# Patient Record
Sex: Female | Born: 1960 | Race: White | Hispanic: No | Smoking: Former smoker
Health system: Southern US, Community
[De-identification: ages and names within clinical notes are randomized; demographics above are authoritative.]

## PROBLEM LIST (undated history)

## (undated) DIAGNOSIS — E0500 Thyrotoxicosis with diffuse goiter without thyrotoxic crisis or storm: Secondary | ICD-10-CM

## (undated) DIAGNOSIS — T7840XA Allergy, unspecified, initial encounter: Secondary | ICD-10-CM

## (undated) DIAGNOSIS — F329 Major depressive disorder, single episode, unspecified: Secondary | ICD-10-CM

## (undated) DIAGNOSIS — I10 Essential (primary) hypertension: Secondary | ICD-10-CM

## (undated) DIAGNOSIS — F32A Depression, unspecified: Secondary | ICD-10-CM

## (undated) DIAGNOSIS — J449 Chronic obstructive pulmonary disease, unspecified: Secondary | ICD-10-CM

## (undated) DIAGNOSIS — Z9001 Acquired absence of eye: Secondary | ICD-10-CM

## (undated) DIAGNOSIS — F419 Anxiety disorder, unspecified: Secondary | ICD-10-CM

## (undated) DIAGNOSIS — E785 Hyperlipidemia, unspecified: Secondary | ICD-10-CM

## (undated) DIAGNOSIS — K529 Noninfective gastroenteritis and colitis, unspecified: Secondary | ICD-10-CM

## (undated) HISTORY — DX: Allergy, unspecified, initial encounter: T78.40XA

## (undated) HISTORY — DX: Noninfective gastroenteritis and colitis, unspecified: K52.9

## (undated) HISTORY — DX: Major depressive disorder, single episode, unspecified: F32.9

## (undated) HISTORY — DX: Hyperlipidemia, unspecified: E78.5

## (undated) HISTORY — DX: Thyrotoxicosis with diffuse goiter without thyrotoxic crisis or storm: E05.00

## (undated) HISTORY — PX: ABDOMINAL HYSTERECTOMY: SHX81

## (undated) HISTORY — PX: FRACTURE SURGERY: SHX138

## (undated) HISTORY — DX: Essential (primary) hypertension: I10

## (undated) HISTORY — DX: Anxiety disorder, unspecified: F41.9

## (undated) HISTORY — DX: Depression, unspecified: F32.A

## (undated) HISTORY — DX: Acquired absence of eye: Z90.01

---

## 2005-01-24 DIAGNOSIS — Z9001 Acquired absence of eye: Secondary | ICD-10-CM

## 2005-01-24 HISTORY — DX: Acquired absence of eye: Z90.01

## 2005-09-24 ENCOUNTER — Emergency Department: Payer: Self-pay | Admitting: Emergency Medicine

## 2015-06-23 DIAGNOSIS — J439 Emphysema, unspecified: Secondary | ICD-10-CM | POA: Insufficient documentation

## 2017-12-06 ENCOUNTER — Emergency Department: Payer: Medicaid Other

## 2017-12-06 ENCOUNTER — Emergency Department
Admission: EM | Admit: 2017-12-06 | Discharge: 2017-12-06 | Disposition: A | Discharge status: Home / Self Care | Payer: Medicaid Other | Attending: Emergency Medicine | Admitting: Emergency Medicine

## 2017-12-06 ENCOUNTER — Other Ambulatory Visit: Payer: Self-pay

## 2017-12-06 ENCOUNTER — Encounter: Payer: Self-pay | Admitting: Emergency Medicine

## 2017-12-06 DIAGNOSIS — J441 Chronic obstructive pulmonary disease with (acute) exacerbation: Secondary | ICD-10-CM | POA: Insufficient documentation

## 2017-12-06 DIAGNOSIS — F17210 Nicotine dependence, cigarettes, uncomplicated: Secondary | ICD-10-CM | POA: Insufficient documentation

## 2017-12-06 DIAGNOSIS — Z7982 Long term (current) use of aspirin: Secondary | ICD-10-CM | POA: Insufficient documentation

## 2017-12-06 DIAGNOSIS — Z79899 Other long term (current) drug therapy: Secondary | ICD-10-CM | POA: Insufficient documentation

## 2017-12-06 DIAGNOSIS — R0602 Shortness of breath: Secondary | ICD-10-CM | POA: Diagnosis present

## 2017-12-06 HISTORY — DX: Chronic obstructive pulmonary disease, unspecified: J44.9

## 2017-12-06 LAB — BASIC METABOLIC PANEL
ANION GAP: 9 (ref 5–15)
BUN: 14 mg/dL (ref 6–20)
CHLORIDE: 101 mmol/L (ref 98–111)
CO2: 30 mmol/L (ref 22–32)
Calcium: 9.8 mg/dL (ref 8.9–10.3)
Creatinine, Ser: 0.71 mg/dL (ref 0.44–1.00)
GFR calc non Af Amer: >60 mL/min (ref >60)
GLUCOSE: 104 mg/dL — AB (ref 70–99)
POTASSIUM: 4.3 mmol/L (ref 3.5–5.1)
Sodium: 140 mmol/L (ref 135–145)

## 2017-12-06 LAB — CBC
HEMATOCRIT: 50.6 % — AB (ref 36.0–46.0)
HEMOGLOBIN: 16.4 g/dL — AB (ref 12.0–15.0)
MCH: 30 pg (ref 26.0–34.0)
MCHC: 32.4 g/dL (ref 30.0–36.0)
MCV: 92.7 fL (ref 80.0–100.0)
NRBC: 0 % (ref 0.0–0.2)
PLATELETS: 303 10*3/uL (ref 150–400)
RBC: 5.46 MIL/uL — AB (ref 3.87–5.11)
RDW: 13.5 % (ref 11.5–15.5)
WBC: 8.2 10*3/uL (ref 4.0–10.5)

## 2017-12-06 LAB — TROPONIN I: Troponin I: <0.03 ng/mL (ref <0.03)

## 2017-12-06 LAB — INFLUENZA PANEL BY PCR (TYPE A & B)
INFLAPCR: NEGATIVE
INFLBPCR: NEGATIVE

## 2017-12-06 LAB — MAGNESIUM: Magnesium: 2 mg/dL (ref 1.7–2.4)

## 2017-12-06 MED ORDER — IPRATROPIUM-ALBUTEROL 0.5-2.5 (3) MG/3ML IN SOLN
3.0000 mL | Freq: Once | RESPIRATORY_TRACT | Status: AC
  Administered 2017-12-06: 3 mL via RESPIRATORY_TRACT
  Filled 2017-12-06: qty 6

## 2017-12-06 MED ORDER — CYCLOBENZAPRINE HCL 10 MG PO TABS: 10.0000 mg | ORAL_TABLET | Freq: Three times a day (TID) | ORAL | 0 refills | Status: AC | PRN

## 2017-12-06 MED ORDER — PREDNISONE 20 MG PO TABS: 60.0000 mg | ORAL_TABLET | Freq: Every day | ORAL | 0 refills | Status: AC

## 2017-12-06 MED ORDER — ALBUTEROL SULFATE (2.5 MG/3ML) 0.083% IN NEBU
2.5000 mg | INHALATION_SOLUTION | Freq: Once | RESPIRATORY_TRACT | Status: AC
  Administered 2017-12-06: 2.5 mg via RESPIRATORY_TRACT
  Filled 2017-12-06: qty 3

## 2017-12-06 MED ORDER — CYCLOBENZAPRINE HCL 10 MG PO TABS
10.0000 mg | ORAL_TABLET | Freq: Once | ORAL | Status: AC
  Administered 2017-12-06: 10 mg via ORAL
  Filled 2017-12-06: qty 1

## 2017-12-06 MED ORDER — IPRATROPIUM-ALBUTEROL 0.5-2.5 (3) MG/3ML IN SOLN
3.0000 mL | Freq: Once | RESPIRATORY_TRACT | Status: AC
  Administered 2017-12-06: 3 mL via RESPIRATORY_TRACT
  Filled 2017-12-06: qty 3

## 2017-12-06 MED ORDER — ALBUTEROL SULFATE HFA 108 (90 BASE) MCG/ACT IN AERS: 2.0000 | INHALATION_SPRAY | Freq: Four times a day (QID) | RESPIRATORY_TRACT | 1 refills | Status: DC | PRN

## 2017-12-06 MED ORDER — METHYLPREDNISOLONE SODIUM SUCC 125 MG IJ SOLR
125.0000 mg | Freq: Once | INTRAMUSCULAR | Status: AC
  Administered 2017-12-06: 125 mg via INTRAVENOUS
  Filled 2017-12-06: qty 2

## 2017-12-06 NOTE — ED Provider Notes (Signed)
[facility] Emergency Department Provider Note ____________________________________________   First MD Initiated Contact with Patient 12/06/17 1426     (approximate)  I have reviewed the triage vital signs and the nursing notes.   HISTORY  Chief Complaint Shortness of Breath    HPI Alicia Gibson is a 57 y.o. female with PMH of COPD on home O2 who presents with shortness of breath, gradual onset, persistent course over the last week, and associated with nonproductive cough.  The patient also feels like she has muscle pain in her lungs due to the cough.  She also reports some itching and "hives" to her upper back.  No fevers, vomiting, chest pain, or leg swelling.   Past Medical History:  Diagnosis Date  . COPD (chronic obstructive pulmonary disease) (HCC)     There are no active problems to display for this patient.   Past Surgical History:  Procedure Laterality Date  . ABDOMINAL HYSTERECTOMY    . FRACTURE SURGERY      Prior to Admission medications   Medication Sig Start Date End Date Taking? Authorizing Provider  albuterol (PROVENTIL HFA;VENTOLIN HFA) 108 (90 Base) MCG/ACT inhaler Inhale 2 puffs into the lungs every 4 (four) hours as needed for wheezing.   Yes [provider]  albuterol (PROVENTIL) (2.5 MG/3ML) 0.083% nebulizer solution Take 2.5 mg by nebulization 4 (four) times daily.   Yes [provider]  amLODipine (NORVASC) 5 MG tablet Take 5 mg by mouth daily.   Yes [provider]  aspirin 81 MG chewable tablet Chew 81 mg by mouth daily.   Yes [provider]  cyclobenzaprine (FLEXERIL) 10 MG tablet Take 10 mg by mouth 2 (two) times daily as needed for muscle spasms.   Yes [provider]  fluticasone furoate-vilanterol (BREO ELLIPTA) 200-25 MCG/INH AEPB Inhale 1 puff into the lungs daily.   Yes [provider]  guaiFENesin (MUCINEX) 600 MG 12 hr tablet Take 1,200 mg by mouth 2 (two)  times daily as needed for cough or to loosen phlegm.   Yes [provider]  ibuprofen (ADVIL,MOTRIN) 200 MG tablet Take 200 mg by mouth every 6 (six) hours as needed for headache or mild pain.   Yes [provider]  mometasone-formoterol (DULERA) 200-5 MCG/ACT AERO Inhale 2 puffs into the lungs 2 (two) times daily.   Yes [provider]  Multiple Vitamin (MULTIVITAMIN WITH MINERALS) TABS tablet Take 1 tablet by mouth daily.   Yes [provider]  Tiotropium Bromide Monohydrate 2.5 MCG/ACT AERS Inhale 2 puffs into the lungs at bedtime.   Yes [provider]  albuterol (PROVENTIL HFA;VENTOLIN HFA) 108 (90 Base) MCG/ACT inhaler Inhale 2 puffs into the lungs every 6 (six) hours as needed for wheezing or shortness of breath. 12/06/17   [name], MD  cyclobenzaprine (FLEXERIL) 10 MG tablet Take 1 tablet (10 mg total) by mouth 3 (three) times daily as needed for up to 5 days. 12/06/17 12/11/17  [name], MD  predniSONE (DELTASONE) 20 MG tablet Take 3 tablets (60 mg total) by mouth daily for 5 days. 12/07/17 12/12/17  [name], MD    Allergies Codeine; Shellfish allergy; and Penicillins  No family history on file.  Social History Social History   Tobacco Use  . Smoking status: Current Some Day Smoker    Types: Cigarettes  . Smokeless tobacco: Never Used  Substance Use Topics  . Alcohol use: Never    Frequency: Never  . Drug use: Never  Review of Systems  Constitutional: No fever. Eyes: No redness. ENT: No sore throat. Cardiovascular: Denies chest pain. Respiratory: Positive for shortness of breath. Gastrointestinal: No vomiting.  Genitourinary: Negative for flank pain.  Musculoskeletal: Positive for back pain. Skin: Positive for itchy rash to the upper back. Neurological: Negative for headache.   ____________________________________________   PHYSICAL EXAM:  VITAL SIGNS: ED Triage Vitals  Enc  Vitals Group     BP 12/06/17 1414 (!) 142/104     Pulse Rate 12/06/17 1414 (!) 107     Resp 12/06/17 1414 (!) 24     Temp 12/06/17 1414 97.7 F (36.5 C)     Temp Source 12/06/17 1414 Oral     SpO2 12/06/17 1414 96 %     Weight 12/06/17 1415 110 lb (49.9 kg)     Height 12/06/17 1415 5\' 6"  (1.676 m)     Head Circumference --      Peak Flow --      Pain Score 12/06/17 1414 8     Pain Loc --      Pain Edu? --      Excl. in GC? --     Constitutional: Alert and oriented.  Uncomfortable appearing with increased work of breathing but no acute distress.  Speaking full sentences. Eyes: Conjunctivae are normal.  Head: Atraumatic. Nose: No congestion/rhinnorhea. Mouth/Throat: Mucous membranes are slightly dry.   Neck: Normal range of motion.  Cardiovascular: Tachycardic, regular rhythm. Grossly normal heart sounds.  Good peripheral circulation. Respiratory: Increased respiratory effort.  Decreased breath sounds bilaterally with faint wheezing. Gastrointestinal: Soft and nontender. No distention.  Genitourinary: No flank tenderness. Musculoskeletal: No lower extremity edema.  Extremities warm and well perfused.  Neurologic:  Normal speech and language. No gross focal neurologic deficits are appreciated.  Skin:  Skin is warm and dry. No rash noted. Psychiatric: Mood and affect are normal. Speech and behavior are normal.  ____________________________________________   LABS (all labs ordered are listed, but only abnormal results are displayed)  Labs Reviewed  BASIC METABOLIC PANEL - Abnormal; Notable for the following components:      Result Value   Glucose, Bld 104 (*)    All other components within normal limits  CBC - Abnormal; Notable for the following components:   RBC 5.46 (*)    Hemoglobin 16.4 (*)    HCT 50.6 (*)    All other components within normal limits  TROPONIN I  MAGNESIUM  INFLUENZA PANEL BY PCR (TYPE A & B)    ____________________________________________  EKG  ED ECG REPORT I, [name], the attending physician, personally viewed and interpreted this ECG.  Date: 12/06/2017 EKG Time: 1417 Rate: 103 Rhythm: Sinus tachycardia QRS Axis: Right axis Intervals: normal ST/T Wave abnormalities: normal Narrative Interpretation: Pulmonary disease pattern with no evidence of acute ischemia  ____________________________________________  RADIOLOGY  CXR no focal infiltrate  ____________________________________________   PROCEDURES  Procedure(s) performed: No  Procedures  Critical Care performed: No ____________________________________________   INITIAL IMPRESSION / ASSESSMENT AND PLAN / ED COURSE  Pertinent labs & imaging results that were available during my care of the patient were reviewed by me and considered in my medical decision making (see chart for details).  57 year old female with history of COPD on 2 L home O2 presents with worsening shortness of breath and cough over the last week, acutely worsened in the last few days.  The patient states that today she came in because her boss at work made her be seen.  On exam the patient is slightly tachycardic but her other vital signs are normal.  Her O2 saturation is in the mid to high 90s on her normal 2 L.  She does have increased work of breathing, decreased breath sounds bilaterally, and wheezing.  Overall presentation is consistent with COPD.  Differential also includes pneumonia or acute bronchitis.  I have a low suspicion for cardiac etiology, and given the decreased breath sounds and wheezing as well as the known history of COPD I do not suspect PE.  We will obtain chest x-ray, give bronchodilators and steroid, lab work-up, influenza, and reassess.  ----------------------------------------- 3:56 PM on 12/06/2017 -----------------------------------------  The patient is feeling somewhat better after the initial  nebulizer treatment.  Her chest x-ray shows no focal infiltrate.  Her lab work-up is reassuring.  Since her O2 saturation is good and she is improving with the treatment, I think she likely would be able to go home after another 1 to 2 hours of observation.  She is pending the flu swab.  I am signing the patient out to the oncoming physician Dr. Derrill KayGoodman. ____________________________________________   FINAL CLINICAL IMPRESSION(S) / ED DIAGNOSES  Final diagnoses:  COPD exacerbation (HCC)      NEW MEDICATIONS STARTED DURING THIS VISIT:  New Prescriptions   ALBUTEROL (PROVENTIL HFA;VENTOLIN HFA) 108 (90 BASE) MCG/ACT INHALER    Inhale 2 puffs into the lungs every 6 (six) hours as needed for wheezing or shortness of breath.   CYCLOBENZAPRINE (FLEXERIL) 10 MG TABLET    Take 1 tablet (10 mg total) by mouth 3 (three) times daily as needed for up to 5 days.   PREDNISONE (DELTASONE) 20 MG TABLET    Take 3 tablets (60 mg total) by mouth daily for 5 days.     Note:  This document was prepared using Dragon voice recognition software and may include unintentional dictation errors.    Dionne BucySiadecki, Cederic Mozley, MD 12/06/17 [phone]

## 2017-12-06 NOTE — ED Triage Notes (Signed)
Pt in via POV with worsening shortness of breath over the last few days.  Pt also reports hives, itching to back and chest x one week.  Pt afebrile upon arrival, on 2L nasal cannula at baseline, acute dyspnea at rest.  Pt roomed upon completion of triage.

## 2017-12-06 NOTE — ED Provider Notes (Signed)
Patient's flu negative. On my exam still has small amount of wheezing. Discussed with patient. She states she feels much improved. Feels comfortable going home at this time and that she is close to her baseline in terms of breathing. Discussed return precautions.   Phineas SemenGoodman, Zadrian Mccauley, MD 12/06/17 [phone]

## 2017-12-06 NOTE — ED Notes (Signed)
Pt states she has hives on upper back that are very itchy. Multiple scabbed over bumps noted on upper back.

## 2017-12-06 NOTE — Discharge Instructions (Signed)
Take the prednisone as prescribed for the next 5 days starting tomorrow.  Return to the ER for new, worsening, persistent severe shortness of breath, wheezing, chest pain, or any other new or worsening symptoms that concern you.

## 2017-12-20 ENCOUNTER — Other Ambulatory Visit: Payer: Self-pay | Admitting: Ophthalmology

## 2017-12-20 DIAGNOSIS — J449 Chronic obstructive pulmonary disease, unspecified: Secondary | ICD-10-CM

## 2018-01-09 ENCOUNTER — Other Ambulatory Visit: Payer: Self-pay | Admitting: Ophthalmology

## 2018-01-09 ENCOUNTER — Ambulatory Visit: Admission: RE | Admit: 2018-01-09 | Payer: Disability Insurance | Source: Ambulatory Visit | Admitting: *Deleted

## 2018-01-09 ENCOUNTER — Ambulatory Visit: Payer: Disability Insurance | Attending: Ophthalmology

## 2018-01-09 DIAGNOSIS — J449 Chronic obstructive pulmonary disease, unspecified: Secondary | ICD-10-CM

## 2018-01-09 DIAGNOSIS — M549 Dorsalgia, unspecified: Secondary | ICD-10-CM

## 2018-01-09 MED ORDER — ALBUTEROL SULFATE (2.5 MG/3ML) 0.083% IN NEBU
2.5000 mg | INHALATION_SOLUTION | Freq: Once | RESPIRATORY_TRACT | Status: AC
  Administered 2018-01-09: 2.5 mg via RESPIRATORY_TRACT
  Filled 2018-01-09: qty 3

## 2018-02-01 ENCOUNTER — Other Ambulatory Visit: Payer: Self-pay

## 2018-02-01 ENCOUNTER — Ambulatory Visit: Payer: Self-pay | Admitting: Gerontology

## 2018-02-01 ENCOUNTER — Encounter: Payer: Self-pay | Admitting: Gerontology

## 2018-02-01 VITALS — BP 141/86 | HR 91 | Temp 97.5°F | Ht 65.0 in | Wt 112.5 lb

## 2018-02-01 DIAGNOSIS — Z720 Tobacco use: Secondary | ICD-10-CM

## 2018-02-01 DIAGNOSIS — Z0000 Encounter for general adult medical examination without abnormal findings: Secondary | ICD-10-CM

## 2018-02-01 DIAGNOSIS — Z7689 Persons encountering health services in other specified circumstances: Secondary | ICD-10-CM

## 2018-02-01 DIAGNOSIS — M62838 Other muscle spasm: Secondary | ICD-10-CM

## 2018-02-01 DIAGNOSIS — I10 Essential (primary) hypertension: Secondary | ICD-10-CM

## 2018-02-01 DIAGNOSIS — J449 Chronic obstructive pulmonary disease, unspecified: Secondary | ICD-10-CM

## 2018-02-01 MED ORDER — ASPIRIN 81 MG PO CHEW: 81.0000 mg | CHEWABLE_TABLET | Freq: Every day | ORAL | 3 refills | Status: DC

## 2018-02-01 MED ORDER — ALBUTEROL SULFATE (2.5 MG/3ML) 0.083% IN NEBU: 2.5000 mg | INHALATION_SOLUTION | Freq: Four times a day (QID) | RESPIRATORY_TRACT | 3 refills | Status: DC

## 2018-02-01 MED ORDER — MOMETASONE FURO-FORMOTEROL FUM 200-5 MCG/ACT IN AERO: 2.0000 | INHALATION_SPRAY | Freq: Two times a day (BID) | RESPIRATORY_TRACT | 3 refills | Status: DC

## 2018-02-01 MED ORDER — AMLODIPINE BESYLATE 5 MG PO TABS: 5.0000 mg | ORAL_TABLET | Freq: Every day | ORAL | 1 refills | Status: DC

## 2018-02-01 MED ORDER — CYCLOBENZAPRINE HCL 10 MG PO TABS: 10.0000 mg | ORAL_TABLET | Freq: Two times a day (BID) | ORAL | 3 refills | Status: DC | PRN

## 2018-02-01 MED ORDER — TIOTROPIUM BROMIDE MONOHYDRATE 2.5 MCG/ACT IN AERS: 2.0000 | INHALATION_SPRAY | Freq: Every day | RESPIRATORY_TRACT | 3 refills | Status: DC

## 2018-02-01 MED ORDER — FLUTICASONE FUROATE-VILANTEROL 200-25 MCG/INH IN AEPB: 1.0000 | INHALATION_SPRAY | Freq: Every day | RESPIRATORY_TRACT | 3 refills | Status: DC

## 2018-02-01 MED ORDER — ALBUTEROL SULFATE HFA 108 (90 BASE) MCG/ACT IN AERS: 2.0000 | INHALATION_SPRAY | Freq: Four times a day (QID) | RESPIRATORY_TRACT | 1 refills | Status: DC | PRN

## 2018-02-01 NOTE — Patient Instructions (Signed)
DASH Eating Plan DASH stands for "Dietary Approaches to Stop Hypertension." The DASH eating plan is a healthy eating plan that has been shown to reduce high blood pressure (hypertension). It may also reduce your risk for type 2 diabetes, heart disease, and stroke. The DASH eating plan may also help with weight loss. What are tips for following this plan?  General guidelines  Avoid eating more than 2,300 mg (milligrams) of salt (sodium) a day. If you have hypertension, you may need to reduce your sodium intake to 1,500 mg a day.  Limit alcohol intake to no more than 1 drink a day for nonpregnant women and 2 drinks a day for men. One drink equals 12 oz of beer, 5 oz of wine, or 1 oz of hard liquor.  Work with your health care provider to maintain a healthy body weight or to lose weight. Ask what an ideal weight is for you.  Get at least 30 minutes of exercise that causes your heart to beat faster (aerobic exercise) most days of the week. Activities may include walking, swimming, or biking.  Work with your health care provider or diet and nutrition specialist (dietitian) to adjust your eating plan to your individual calorie needs. Reading food labels   Check food labels for the amount of sodium per serving. Choose foods with less than 5 percent of the Daily Value of sodium. Generally, foods with less than 300 mg of sodium per serving fit into this eating plan.  To find whole grains, look for the word "whole" as the first word in the ingredient list. Shopping  Buy products labeled as "low-sodium" or "no salt added."  Buy fresh foods. Avoid canned foods and premade or frozen meals. Cooking  Avoid adding salt when cooking. Use salt-free seasonings or herbs instead of table salt or sea salt. Check with your health care provider or pharmacist before using salt substitutes.  Do not fry foods. Cook foods using healthy methods such as baking, boiling, grilling, and broiling instead.  Cook with  heart-healthy oils, such as olive, canola, soybean, or sunflower oil. Meal planning  Eat a balanced diet that includes: ? 5 or more servings of fruits and vegetables each day. At each meal, try to fill half of your plate with fruits and vegetables. ? Up to 6-8 servings of whole grains each day. ? Less than 6 oz of lean meat, poultry, or fish each day. A 3-oz serving of meat is about the same size as a deck of cards. One egg equals 1 oz. ? 2 servings of low-fat dairy each day. ? A serving of nuts, seeds, or beans 5 times each week. ? Heart-healthy fats. Healthy fats called Omega-3 fatty acids are found in foods such as flaxseeds and coldwater fish, like sardines, salmon, and mackerel.  Limit how much you eat of the following: ? Canned or prepackaged foods. ? Food that is high in trans fat, such as fried foods. ? Food that is high in saturated fat, such as fatty meat. ? Sweets, desserts, sugary drinks, and other foods with added sugar. ? Full-fat dairy products.  Do not salt foods before eating.  Try to eat at least 2 vegetarian meals each week.  Eat more home-cooked food and less restaurant, buffet, and fast food.  When eating at a restaurant, ask that your food be prepared with less salt or no salt, if possible. What foods are recommended? The items listed may not be a complete list. Talk with your dietitian about   what dietary choices are best for you. Grains Whole-grain or whole-wheat bread. Whole-grain or whole-wheat pasta. Brown rice. Oatmeal. Quinoa. Bulgur. Whole-grain and low-sodium cereals. Pita bread. Low-fat, low-sodium crackers. Whole-wheat flour tortillas. Vegetables Fresh or frozen vegetables (raw, steamed, roasted, or grilled). Low-sodium or reduced-sodium tomato and vegetable juice. Low-sodium or reduced-sodium tomato sauce and tomato paste. Low-sodium or reduced-sodium canned vegetables. Fruits All fresh, dried, or frozen fruit. Canned fruit in natural juice (without  added sugar). Meat and other protein foods Skinless chicken or turkey. Ground chicken or turkey. Pork with fat trimmed off. Fish and seafood. Egg whites. Dried beans, peas, or lentils. Unsalted nuts, nut butters, and seeds. Unsalted canned beans. Lean cuts of beef with fat trimmed off. Low-sodium, lean deli meat. Dairy Low-fat (1%) or fat-free (skim) milk. Fat-free, low-fat, or reduced-fat cheeses. Nonfat, low-sodium ricotta or cottage cheese. Low-fat or nonfat yogurt. Low-fat, low-sodium cheese. Fats and oils Soft margarine without trans fats. Vegetable oil. Low-fat, reduced-fat, or light mayonnaise and salad dressings (reduced-sodium). Canola, safflower, olive, soybean, and sunflower oils. Avocado. Seasoning and other foods Herbs. Spices. Seasoning mixes without salt. Unsalted popcorn and pretzels. Fat-free sweets. What foods are not recommended? The items listed may not be a complete list. Talk with your dietitian about what dietary choices are best for you. Grains Baked goods made with fat, such as croissants, muffins, or some breads. Dry pasta or rice meal packs. Vegetables Creamed or fried vegetables. Vegetables in a cheese sauce. Regular canned vegetables (not low-sodium or reduced-sodium). Regular canned tomato sauce and paste (not low-sodium or reduced-sodium). Regular tomato and vegetable juice (not low-sodium or reduced-sodium). Pickles. Olives. Fruits Canned fruit in a light or heavy syrup. Fried fruit. Fruit in cream or butter sauce. Meat and other protein foods Fatty cuts of meat. Ribs. Fried meat. Bacon. Sausage. Bologna and other processed lunch meats. Salami. Fatback. Hotdogs. Bratwurst. Salted nuts and seeds. Canned beans with added salt. Canned or smoked fish. Whole eggs or egg yolks. Chicken or turkey with skin. Dairy Whole or 2% milk, cream, and half-and-half. Whole or full-fat cream cheese. Whole-fat or sweetened yogurt. Full-fat cheese. Nondairy creamers. Whipped toppings.  Processed cheese and cheese spreads. Fats and oils Butter. Stick margarine. Lard. Shortening. Ghee. Bacon fat. Tropical oils, such as coconut, palm kernel, or palm oil. Seasoning and other foods Salted popcorn and pretzels. Onion salt, garlic salt, seasoned salt, table salt, and sea salt. Worcestershire sauce. Tartar sauce. Barbecue sauce. Teriyaki sauce. Soy sauce, including reduced-sodium. Steak sauce. Canned and packaged gravies. Fish sauce. Oyster sauce. Cocktail sauce. Horseradish that you find on the shelf. Ketchup. Mustard. Meat flavorings and tenderizers. Bouillon cubes. Hot sauce and Tabasco sauce. Premade or packaged marinades. Premade or packaged taco seasonings. Relishes. Regular salad dressings. Where to find more information:  National Heart, Lung, and Blood Institute: www.nhlbi.nih.gov  American Heart Association: www.heart.org Summary  The DASH eating plan is a healthy eating plan that has been shown to reduce high blood pressure (hypertension). It may also reduce your risk for type 2 diabetes, heart disease, and stroke.  With the DASH eating plan, you should limit salt (sodium) intake to 2,300 mg a day. If you have hypertension, you may need to reduce your sodium intake to 1,500 mg a day.  When on the DASH eating plan, aim to eat more fresh fruits and vegetables, whole grains, lean proteins, low-fat dairy, and heart-healthy fats.  Work with your health care provider or diet and nutrition specialist (dietitian) to adjust your eating plan to your   individual calorie needs. This information is not intended to replace advice given to you by your health care provider. Make sure you discuss any questions you have with your health care provider. Document Released: 12/30/2010 Document Revised: 01/04/2016 Document Reviewed: 01/04/2016 Elsevier Interactive Patient Education  2019 [employer]. Smoking Tobacco Information, Adult Smoking tobacco can be harmful to your health. Tobacco  contains a poisonous (toxic), colorless chemical called nicotine. Nicotine is addictive. It changes the brain and can make it hard to stop smoking. Tobacco also has other toxic chemicals that can hurt your body and raise your risk of many cancers. How can smoking tobacco affect me? Smoking tobacco puts you at risk for:  Cancer. Smoking is most commonly associated with lung cancer, but can also lead to cancer in other parts of the body.  Chronic obstructive pulmonary disease (COPD). This is a long-term lung condition that makes it hard to breathe. It also gets worse over time.  High blood pressure (hypertension), heart disease, stroke, or heart attack.  Lung infections, such as pneumonia.  Cataracts. This is when the lenses in the eyes become clouded.  Digestive problems. This may include peptic ulcers, heartburn, and gastroesophageal reflux disease (GERD).  Oral health problems, such as gum disease and tooth loss.  Loss of taste and smell. Smoking can affect your appearance by causing:  Wrinkles.  Yellow or stained teeth, fingers, and fingernails. Smoking tobacco can also affect your social life, because:  It may be challenging to find places to smoke when away from home. Many workplaces, restaurants, hotels, and public places are tobacco-free.  Smoking is expensive. This is due to the cost of tobacco and the long-term costs of treating health problems from smoking.  Secondhand smoke may affect those around you. Secondhand smoke can cause lung cancer, breathing problems, and heart disease. Children of smokers have a higher risk for: ? Sudden infant death syndrome (SIDS). ? Ear infections. ? Lung infections. If you currently smoke tobacco, quitting now can help you:  Lead a longer and healthier life.  Look, smell, breathe, and feel better over time.  Save money.  Protect others from the harms of secondhand smoke. What actions can I take to prevent health problems? Quit  smoking   Do not start smoking. Quit if you already do.  Make a plan to quit smoking and commit to it. Look for programs to help you and ask your health care provider for recommendations and ideas.  Set a date and write down all the reasons you want to quit.  Let your friends and family know you are quitting so they can help and support you. Consider finding friends who also want to quit. It can be easier to quit with someone else, so that you can support each other.  Talk with your health care provider about using nicotine replacement medicines to help you quit, such as gum, lozenges, patches, sprays, or pills.  Do not replace cigarette smoking with electronic cigarettes, which are commonly called e-cigarettes. The safety of e-cigarettes is not known, and some may contain harmful chemicals.  If you try to quit but return to smoking, stay positive. It is common to slip up when you first quit, so take it one day at a time.  Be prepared for cravings. When you feel the urge to smoke, chew gum or suck on hard candy. Lifestyle  Stay busy and take care of your body.  Drink enough fluid to keep your urine pale yellow.  Get plenty of   exercise and eat a healthy diet. This can help prevent weight gain after quitting.  Monitor your eating habits. Quitting smoking can cause you to have a larger appetite than when you smoke.  Find ways to relax. Go out with friends or family to a movie or a restaurant where people do not smoke.  Ask your health care provider about having regular tests (screenings) to check for cancer. This may include blood tests, imaging tests, and other tests.  Find ways to manage your stress, such as meditation, yoga, or exercise. Where to find support To get support to quit smoking, consider:  Asking your health care provider for more information and resources.  Taking classes to learn more about quitting smoking.  Looking for local organizations that offer resources  about quitting smoking.  Joining a support group for people who want to quit smoking in your local community.  Calling the smokefree.gov counselor helpline: 1-800-Quit-Now (1-800-784-8669) Where to find more information You may find more information about quitting smoking from:  HelpGuide.org: www.helpguide.org  Smokefree.gov: smokefree.gov  American Lung Association: www.lung.org Contact a health care provider if you:  Have problems breathing.  Notice that your lips, nose, or fingers turn blue.  Have chest pain.  Are coughing up blood.  Feel faint or you pass out.  Have other health changes that cause you to worry. Summary  Smoking tobacco can negatively affect your health, the health of those around you, your finances, and your social life.  Do not start smoking. Quit if you already do. If you need help quitting, ask your health care provider.  Think about joining a support group for people who want to quit smoking in your local community. There are many effective programs that will help you to quit this behavior. This information is not intended to replace advice given to you by your health care provider. Make sure you discuss any questions you have with your health care provider. Document Released: 01/26/2016 Document Revised: 03/01/2017 Document Reviewed: 01/26/2016 Elsevier Interactive Patient Education  2019 [employer].  

## 2018-02-01 NOTE — Progress Notes (Signed)
Patient: Alicia Gibson Female    DOB: 09/20/1960   58 y.o.   MRN: [id number] Visit Date: 02/01/2018  Today's Provider: Chioma E Iloabachie, NP   Chief Complaint  Patient presents with  . Hypertension    Here for initial visit, COPD with 2 liters of O2. Requests Flexeril for chest spasms.   Subjective:    HPI [name] 58 y/o female present for initial visit and discuss about COPD and hypertension.  Currently she stated that she experiences shortness of breath with exertion which affects her ADL. she is on 2 L oxygen and increases it to 3 L with activity. she denies checking her oxygen saturation at home. She c/o sinus congestion, intermittent cough with yellowish phlegm. She denies fever, chills and continues to smoke  one cigarette a day. She admits to having muscle spasms around her chest which is relieved with 10 mg flexeril.  She stated that she has not being taking her Amlodipine 5 mg for hypertension since 10/28/2017, and doesn't check her blood pressure at home. She denies chest pain, palpitation, light headedness.   She c/o 7/10 constant frontal headache that has being going on since 10/2017, takes 800 mg of ibuprofen with relief.     Allergies  Allergen Reactions  . Codeine Hives and Nausea And Vomiting  . Shellfish Allergy Hives  . Penicillins Hives and Rash   Previous Medications   ALBUTEROL (PROVENTIL HFA;VENTOLIN HFA) 108 (90 BASE) MCG/ACT INHALER    Inhale 2 puffs into the lungs every 4 (four) hours as needed for wheezing.   GUAIFENESIN (MUCINEX) 600 MG 12 HR TABLET    Take 1,200 mg by mouth 2 (two) times daily as needed for cough or to loosen phlegm.   IBUPROFEN (ADVIL,MOTRIN) 200 MG TABLET    Take 200 mg by mouth every 6 (six) hours as needed for headache or mild pain.   MULTIPLE VITAMIN (MULTIVITAMIN WITH MINERALS) TABS TABLET    Take 1 tablet by mouth daily.    Review of Systems  Constitutional: Positive for fatigue.  HENT: Positive for sinus pressure.   Eyes:  Negative.   Respiratory: Positive for shortness of breath (on 2L oxygen) and wheezing.   Cardiovascular: Negative.   Gastrointestinal: Negative.   Genitourinary: Negative.   Musculoskeletal: Negative.   Skin: Negative.   Neurological: Negative.   Psychiatric/Behavioral: Negative.     Social History   Tobacco Use  . Smoking status: Current Some Day Smoker    Types: Cigarettes  . Smokeless tobacco: Never Used  Substance Use Topics  . Alcohol use: Never    Frequency: Never   Objective:   BP (!) 141/86 (BP Location: Left Arm, Cuff Size: Small)   Pulse 91   Temp (!) 97.5 F (36.4 C) (Oral)   Ht 5' 5" (1.651 m)   Wt 112 lb 8 oz (51 kg)   BMI 18.72 kg/m   Physical Exam Constitutional:      Appearance: Normal appearance.  HENT:     Head: Normocephalic and atraumatic.     Nose: Nose normal.  Eyes:     Extraocular Movements: Extraocular movements intact.     Pupils: Pupils are equal, round, and reactive to light.  Neck:     Musculoskeletal: Normal range of motion.  Cardiovascular:     Rate and Rhythm: Normal rate and regular rhythm.     Pulses: Normal pulses.     Heart sounds: Normal heart sounds.  Pulmonary:     Breath sounds: Examination of   the right-upper field reveals wheezing. Examination of the left-upper field reveals wheezing. Examination of the right-middle field reveals wheezing. Examination of the left-middle field reveals wheezing. Wheezing present.  Abdominal:     General: Bowel sounds are normal.     Palpations: Abdomen is soft.  Musculoskeletal: Normal range of motion.  Skin:    General: Skin is warm and dry.  Neurological:     General: No focal deficit present.     Mental Status: She is alert and oriented to person, place, and time.  Psychiatric:        Mood and Affect: Mood normal.        Behavior: Behavior normal.        Thought Content: Thought content normal.        Judgment: Judgment normal.         Assessment & Plan:     1. Chronic  bronchitis with COPD (chronic obstructive pulmonary disease) (HCC) - She was advised on smoking cessation and will continue her nebulizer treatment and albuterol as ordered. - albuterol (PROVENTIL HFA;VENTOLIN HFA) 108 (90 Base) MCG/ACT inhaler; Inhale 2 puffs into the lungs every 6 (six) hours as needed for wheezing or shortness of breath.  Dispense: 1 Inhaler; Refill: 1 - albuterol (PROVENTIL) (2.5 MG/3ML) 0.083% nebulizer solution; Take 3 mLs (2.5 mg total) by nebulization 4 (four) times daily.  Dispense: 75 mL; Refill: 3 - fluticasone furoate-vilanterol (BREO ELLIPTA) 200-25 MCG/INH AEPB; Inhale 1 puff into the lungs daily.  Dispense: 1 each; Refill: 3 - mometasone-formoterol (DULERA) 200-5 MCG/ACT AERO; Inhale 2 puffs into the lungs 2 (two) times daily.  Dispense: 13 g; Refill: 3 - Tiotropium Bromide Monohydrate 2.5 MCG/ACT AERS; Inhale 2 puffs into the lungs at bedtime.  Dispense: 4 g; Refill: 3  2. Essential hypertension - She was encouraged on low salt diet and smoking cessation - amLODipine (NORVASC) 5 MG tablet; Take 1 tablet (5 mg total) by mouth daily.  Dispense: 30 tablet; Refill: 1 - aspirin 81 MG chewable tablet; Chew 1 tablet (81 mg total) by mouth daily.  Dispense: 30 tablet; Refill: 3  3. Smoking trying to quit - Smoking cessation encouraged - Comp Met (CMET)  4. Healthcare maintenance - She declines Influenza vaccine - CBC w/Diff - Lipid Profile - TSH - Urinalysis  5. Encounter to establish care  - CBC w/Diff - Lipid Profile - TSH - Urinalysis  6. Muscle spasm  - cyclobenzaprine (FLEXERIL) 10 MG tablet; Take 1 tablet (10 mg total) by mouth 2 (two) times daily as needed for muscle spasms.  Dispense: 60 tablet; Refill: 3 - Follow up in 1 month      Chioma E Iloabachie, NP   [facility]  

## 2018-02-02 LAB — CBC WITH DIFFERENTIAL/PLATELET
Basophils Absolute: 0.1 10*3/uL (ref 0.0–0.2)
Basos: 2 %
EOS (ABSOLUTE): 0.9 10*3/uL — AB (ref 0.0–0.4)
EOS: 12 %
Hematocrit: 47.8 % — ABNORMAL HIGH (ref 34.0–46.6)
Hemoglobin: 16.1 g/dL — ABNORMAL HIGH (ref 11.1–15.9)
IMMATURE GRANULOCYTES: 0 %
Immature Grans (Abs): 0 10*3/uL (ref 0.0–0.1)
LYMPHS ABS: 2.2 10*3/uL (ref 0.7–3.1)
Lymphs: 29 %
MCH: 30.7 pg (ref 26.6–33.0)
MCHC: 33.7 g/dL (ref 31.5–35.7)
MCV: 91 fL (ref 79–97)
MONOS ABS: 0.6 10*3/uL (ref 0.1–0.9)
Monocytes: 7 %
NEUTROS ABS: 3.8 10*3/uL (ref 1.4–7.0)
Neutrophils: 50 %
PLATELETS: 276 10*3/uL (ref 150–450)
RBC: 5.25 x10E6/uL (ref 3.77–5.28)
RDW: 13.1 % (ref 11.7–15.4)
WBC: 7.6 10*3/uL (ref 3.4–10.8)

## 2018-02-02 LAB — URINALYSIS
Bilirubin, UA: NEGATIVE
GLUCOSE, UA: NEGATIVE
Ketones, UA: NEGATIVE
Leukocytes, UA: NEGATIVE
Nitrite, UA: NEGATIVE
Protein, UA: NEGATIVE
RBC, UA: NEGATIVE
Specific Gravity, UA: 1.012 (ref 1.005–1.030)
UUROB: 0.2 mg/dL (ref 0.2–1.0)
pH, UA: 5 (ref 5.0–7.5)

## 2018-02-02 LAB — LIPID PANEL
CHOL/HDL RATIO: 4.1 ratio (ref 0.0–4.4)
Cholesterol, Total: 309 mg/dL — ABNORMAL HIGH (ref 100–199)
HDL: 75 mg/dL (ref >39)
LDL CALC: 196 mg/dL — AB (ref 0–99)
TRIGLYCERIDES: 189 mg/dL — AB (ref 0–149)
VLDL CHOLESTEROL CAL: 38 mg/dL (ref 5–40)

## 2018-02-02 LAB — COMPREHENSIVE METABOLIC PANEL
ALT: 178 IU/L — AB (ref 0–32)
AST: 36 IU/L (ref 0–40)
Albumin/Globulin Ratio: 2 (ref 1.2–2.2)
Albumin: 4.7 g/dL (ref 3.5–5.5)
Alkaline Phosphatase: 126 IU/L — ABNORMAL HIGH (ref 39–117)
BUN/Creatinine Ratio: 12 (ref 9–23)
BUN: 9 mg/dL (ref 6–24)
Bilirubin Total: 0.2 mg/dL (ref 0.0–1.2)
CALCIUM: 9.7 mg/dL (ref 8.7–10.2)
CO2: 28 mmol/L (ref 20–29)
Chloride: 98 mmol/L (ref 96–106)
Creatinine, Ser: 0.77 mg/dL (ref 0.57–1.00)
GFR calc Af Amer: 100 mL/min/{1.73_m2} (ref >59)
GFR, EST NON AFRICAN AMERICAN: 87 mL/min/{1.73_m2} (ref >59)
Globulin, Total: 2.3 g/dL (ref 1.5–4.5)
Glucose: 91 mg/dL (ref 65–99)
Potassium: 4.9 mmol/L (ref 3.5–5.2)
Sodium: 142 mmol/L (ref 134–144)
Total Protein: 7 g/dL (ref 6.0–8.5)

## 2018-02-02 LAB — TSH: TSH: 2.31 u[IU]/mL (ref 0.450–4.500)

## 2018-02-06 ENCOUNTER — Other Ambulatory Visit: Payer: Self-pay | Admitting: Gerontology

## 2018-02-07 ENCOUNTER — Other Ambulatory Visit: Payer: Self-pay | Admitting: Gerontology

## 2018-02-07 ENCOUNTER — Other Ambulatory Visit: Payer: Self-pay

## 2018-02-07 DIAGNOSIS — R945 Abnormal results of liver function studies: Principal | ICD-10-CM

## 2018-02-07 DIAGNOSIS — R7989 Other specified abnormal findings of blood chemistry: Secondary | ICD-10-CM

## 2018-02-08 ENCOUNTER — Other Ambulatory Visit: Payer: Self-pay | Admitting: Gerontology

## 2018-02-08 DIAGNOSIS — M62838 Other muscle spasm: Secondary | ICD-10-CM

## 2018-02-08 DIAGNOSIS — R52 Pain, unspecified: Secondary | ICD-10-CM

## 2018-02-08 DIAGNOSIS — Z0000 Encounter for general adult medical examination without abnormal findings: Secondary | ICD-10-CM

## 2018-02-08 LAB — COMPREHENSIVE METABOLIC PANEL
ALT: 50 IU/L — AB (ref 0–32)
AST: 22 IU/L (ref 0–40)
Albumin/Globulin Ratio: 1.9 (ref 1.2–2.2)
Albumin: 4.6 g/dL (ref 3.5–5.5)
Alkaline Phosphatase: 95 IU/L (ref 39–117)
BUN/Creatinine Ratio: 17 (ref 9–23)
BUN: 15 mg/dL (ref 6–24)
Bilirubin Total: 0.2 mg/dL (ref 0.0–1.2)
CHLORIDE: 100 mmol/L (ref 96–106)
CO2: 26 mmol/L (ref 20–29)
Calcium: 10 mg/dL (ref 8.7–10.2)
Creatinine, Ser: 0.89 mg/dL (ref 0.57–1.00)
GFR calc Af Amer: 84 mL/min/{1.73_m2} (ref >59)
GFR calc non Af Amer: 73 mL/min/{1.73_m2} (ref >59)
GLUCOSE: 86 mg/dL (ref 65–99)
Globulin, Total: 2.4 g/dL (ref 1.5–4.5)
POTASSIUM: 5.1 mmol/L (ref 3.5–5.2)
Sodium: 143 mmol/L (ref 134–144)
Total Protein: 7 g/dL (ref 6.0–8.5)

## 2018-02-08 LAB — MAGNESIUM: Magnesium: 2.1 mg/dL (ref 1.6–2.3)

## 2018-02-08 LAB — AMYLASE: Amylase: 107 U/L (ref 31–124)

## 2018-02-08 LAB — PHOSPHORUS: PHOSPHORUS: 4 mg/dL (ref 3.0–4.3)

## 2018-02-08 LAB — HEPATITIS PANEL, ACUTE
Hep A IgM: NEGATIVE
Hep B C IgM: NEGATIVE
Hep C Virus Ab: <0.1 s/co ratio (ref 0.0–0.9)
Hepatitis B Surface Ag: NEGATIVE

## 2018-02-08 LAB — LIPASE: Lipase: 26 U/L (ref 14–72)

## 2018-02-08 MED ORDER — IBUPROFEN 200 MG PO TABS: 200.0000 mg | ORAL_TABLET | Freq: Four times a day (QID) | ORAL | Status: DC | PRN

## 2018-02-08 MED ORDER — ASPIRIN 81 MG PO CHEW: 81.0000 mg | CHEWABLE_TABLET | Freq: Once | ORAL | Status: DC

## 2018-02-13 ENCOUNTER — Other Ambulatory Visit: Payer: Self-pay

## 2018-02-13 DIAGNOSIS — Z0000 Encounter for general adult medical examination without abnormal findings: Secondary | ICD-10-CM

## 2018-02-14 ENCOUNTER — Ambulatory Visit: Payer: Disability Insurance | Admitting: Pharmacy Technician

## 2018-02-14 ENCOUNTER — Other Ambulatory Visit: Payer: Self-pay | Admitting: Gerontology

## 2018-02-14 DIAGNOSIS — E785 Hyperlipidemia, unspecified: Secondary | ICD-10-CM

## 2018-02-14 DIAGNOSIS — Z79899 Other long term (current) drug therapy: Secondary | ICD-10-CM

## 2018-02-14 LAB — LIPID PANEL
Chol/HDL Ratio: 4.4 ratio (ref 0.0–4.4)
Cholesterol, Total: 328 mg/dL — ABNORMAL HIGH (ref 100–199)
HDL: 74 mg/dL (ref >39)
LDL Calculated: 222 mg/dL — ABNORMAL HIGH (ref 0–99)
Triglycerides: 161 mg/dL — ABNORMAL HIGH (ref 0–149)
VLDL Cholesterol Cal: 32 mg/dL (ref 5–40)

## 2018-02-14 MED ORDER — ATORVASTATIN CALCIUM 20 MG PO TABS: 20.0000 mg | ORAL_TABLET | Freq: Every day | ORAL | 0 refills | Status: DC

## 2018-02-14 NOTE — Progress Notes (Signed)
Completed [facility] application and contract.  Patient agreed to all terms of the [facility] contract.    Patient approved to receive medication assistance at [facility] as long as eligibility criteria continues to be met.    Provided patient with community resource material based on her particular needs.    Breo Ellipta, Ventolin & Spiriva Respimat Prescription Applications completed with patient.  Forwarded to [facility] for signature.  Upon receipt of signed applications from provider, [facility] Prescription Application & Ventolin Prescription Application will be submitted to [place]; Spiriva Respimat Prescription Application will be submitted to [employer].  Referred patient for MTM.  [facility]

## 2018-02-22 ENCOUNTER — Ambulatory Visit: Payer: Disability Insurance | Admitting: Pharmacist

## 2018-02-22 ENCOUNTER — Encounter: Payer: Self-pay | Admitting: Pharmacist

## 2018-02-22 ENCOUNTER — Other Ambulatory Visit: Payer: Self-pay

## 2018-02-22 VITALS — BP 120/76 | Wt 113.0 lb

## 2018-02-22 DIAGNOSIS — Z79899 Other long term (current) drug therapy: Secondary | ICD-10-CM

## 2018-02-22 NOTE — Progress Notes (Signed)
[facility] Visit Note  Patient: Alicia Gibson MRN: [id number] Date of Birth: September 23, 1960 PCP: Patient, No Pcp Per   Alicia Gibson 58 y.o. female presents for a medication management visit today.  BP 120/76 (BP Location: Right Arm, Patient Position: Sitting, Cuff Size: Normal)   Wt 113 lb (51.3 kg)   BMI 18.80 kg/m   Patient Information   Past Medical History:  Diagnosis Date  . COPD (chronic obstructive pulmonary disease) (HCC)   . Hyperlipidemia   . Hypertension       Past Surgical History:  Procedure Laterality Date  . ABDOMINAL HYSTERECTOMY    . FRACTURE SURGERY      History reviewed. No pertinent family history.  Family Support: okay (keeps in touch with ex-husband)  Lifestyle Diet: Breakfast: cereal, toast (usually eats around lunchtime) Lunch: depends on how late she eats breakfast, noodles, PB&J sandwich Dinner: roast in crockpot, leftovers sometimes, tacos Drinks: coffee, water, Gingerale (one a day)    Current Exercise Habits: The patient does not participate in regular exercise at present  Exercise limited by: respiratory conditions(s)    Social History   Substance and Sexual Activity  Alcohol Use Never  . Frequency: Never      Social History   Tobacco Use  Smoking Status Current Some Day Smoker  . Types: Cigarettes  Smokeless Tobacco Never Used      Health Maintenance  Topic Date Due  . HIV Screening  10/24/1976  . TETANUS/TDAP  10/24/1980  . PAP SMEAR-Modifier  10/25/1982  . MAMMOGRAM  10/25/2011  . COLONOSCOPY  10/25/2011  . INFLUENZA VACCINE  08/24/2017  . Hepatitis C Screening  Completed   Outpatient Encounter Medications as of 02/22/2018  Medication Sig  . albuterol (PROVENTIL HFA;VENTOLIN HFA) 108 (90 Base) MCG/ACT inhaler Inhale 2 puffs into the lungs every 6 (six) hours as needed for wheezing or shortness of breath.  Marland Kitchen albuterol (PROVENTIL) (2.5 MG/3ML) 0.083% nebulizer solution Take 3 mLs (2.5 mg total)  by nebulization 4 (four) times daily.  Marland Kitchen amLODipine (NORVASC) 5 MG tablet Take 1 tablet (5 mg total) by mouth daily.  Marland Kitchen aspirin EC 81 MG tablet Take 81 mg by mouth daily.  Marland Kitchen atorvastatin (LIPITOR) 20 MG tablet Take 1 tablet (20 mg total) by mouth daily.  . cyclobenzaprine (FLEXERIL) 10 MG tablet Take 1 tablet (10 mg total) by mouth 2 (two) times daily as needed for muscle spasms.  . fluticasone furoate-vilanterol (BREO ELLIPTA) 200-25 MCG/INH AEPB Inhale 1 puff into the lungs daily.  Marland Kitchen guaiFENesin (MUCINEX) 600 MG 12 hr tablet Take 1,200 mg by mouth 2 (two) times daily as needed for cough or to loosen phlegm.  . Multiple Vitamin (MULTIVITAMIN WITH MINERALS) TABS tablet Take 1 tablet by mouth daily.  . Tiotropium Bromide Monohydrate 2.5 MCG/ACT AERS Inhale 2 puffs into the lungs at bedtime.  . [DISCONTINUED] albuterol (PROVENTIL HFA;VENTOLIN HFA) 108 (90 Base) MCG/ACT inhaler Inhale 2 puffs into the lungs every 4 (four) hours as needed for wheezing.  . [DISCONTINUED] mometasone-formoterol (DULERA) 200-5 MCG/ACT AERO Inhale 2 puffs into the lungs 2 (two) times daily.   Facility-Administered Encounter Medications as of 02/22/2018  Medication  . ibuprofen (ADVIL,MOTRIN) tablet 200 mg  . [DISCONTINUED] aspirin chewable tablet 81 mg   Health Maintenance/Date Completed  Last ED visit: November  Last Visit to PCP: about a week ago Next Visit to PCP: February Specialist Visit: none Dental Exam: dentures Eye Exam: had one for disability screening Prostate Exam: NA Pelvic/PAP Exam: no  Mammogram: no DEXA: NA Colonoscopy: about 2 years ago Flu Vaccine: no (shellfish allergy) Pneumonia Vaccine: not sure   Assessment and Plan: COPD: Breo, Spiriva, albuterol inhaler & nebs PRN. Inhalers do help. Uses albuterol inhaler at least twice a day and nebulizers at least 3 times a day. Rinses mouth after using Breo. She has filled out the paperwork for PAP and we have sent to her PCP to sign to receive  inhalers from manufacturers.   HTN: amlodipine. Just started recently. BP WNL at visit today. Reports feeling a little tired, but thinks it may be due to respiratory issues. Was having headaches but has been getting better since she started BP medication. We discussed that the headaches could have been due to high BP.  HLD: atorvastatin. Just recently started. LDL 222 checked about a week ago. No issues with starting new medication. Encouraged healthy diet.  Muscle spasms in lungs and legs: cyclobenzaprine. Helps with spasms.  Preventative health: aspirin, multivitamin.  Smoking cessation: has cut back. Maybe smokes a cigarette every few days but states it is no longer worth it with her respiratory issues and being on oxygen.  Compliance: does not miss doses typically. Has a schedule that she adheres to.  I will have her follow up in 1 year for MTM.  Alicia Gibson, PharmD Pharmacy Resident

## 2018-03-02 LAB — LIPID PANEL
Chol/HDL Ratio: 4.6 ratio — ABNORMAL HIGH (ref 0.0–4.4)
Cholesterol, Total: 299 mg/dL — ABNORMAL HIGH (ref 100–199)
HDL: 65 mg/dL (ref >39)
LDL CALC: 202 mg/dL — AB (ref 0–99)
Triglycerides: 162 mg/dL — ABNORMAL HIGH (ref 0–149)
VLDL Cholesterol Cal: 32 mg/dL (ref 5–40)

## 2018-03-02 LAB — SPECIMEN STATUS REPORT

## 2018-03-08 ENCOUNTER — Ambulatory Visit: Payer: Self-pay | Admitting: Gerontology

## 2018-03-12 ENCOUNTER — Telehealth: Payer: Self-pay | Admitting: Pharmacist

## 2018-03-12 NOTE — Telephone Encounter (Signed)
03/12/2018 1:45:46 PM - Virgel Bouquet Ellipta & Ventolin HFA  03/12/2018 Faxed [employer] application for Breo Ellipta 100/91mcg Inhale 1 puff into the lungs daily for wheezing & Ventolin HFA Inhale 2 puffs into the lungs every 6 hours as needed for shortness of breath or wheezing.AJ   03/12/2018 1:44:33 PM - Spiriva Respimat  03/12/2018 Faxed [employer] application for Spiriva Respimat 2.5mg  Inhale 2 puffs into the lungs every day at bedtime.Forde Radon

## 2018-03-14 ENCOUNTER — Other Ambulatory Visit: Payer: Self-pay

## 2018-03-14 ENCOUNTER — Ambulatory Visit: Payer: Medicaid Other | Admitting: Gerontology

## 2018-03-14 ENCOUNTER — Encounter: Payer: Self-pay | Admitting: Gerontology

## 2018-03-14 VITALS — BP 128/79 | HR 98 | Ht 66.0 in | Wt 116.4 lb

## 2018-03-14 DIAGNOSIS — J449 Chronic obstructive pulmonary disease, unspecified: Secondary | ICD-10-CM

## 2018-03-14 DIAGNOSIS — I10 Essential (primary) hypertension: Secondary | ICD-10-CM

## 2018-03-14 DIAGNOSIS — E785 Hyperlipidemia, unspecified: Secondary | ICD-10-CM

## 2018-03-14 MED ORDER — AMLODIPINE BESYLATE 5 MG PO TABS: 5.0000 mg | ORAL_TABLET | Freq: Every day | ORAL | 1 refills | Status: DC

## 2018-03-14 MED ORDER — ATORVASTATIN CALCIUM 20 MG PO TABS: 20.0000 mg | ORAL_TABLET | Freq: Every day | ORAL | 0 refills | Status: DC

## 2018-03-14 NOTE — Patient Instructions (Signed)
DASH Eating Plan DASH stands for "Dietary Approaches to Stop Hypertension." The DASH eating plan is a healthy eating plan that has been shown to reduce high blood pressure (hypertension). It may also reduce your risk for type 2 diabetes, heart disease, and stroke. The DASH eating plan may also help with weight loss. What are tips for following this plan?  General guidelines  Avoid eating more than 2,300 mg (milligrams) of salt (sodium) a day. If you have hypertension, you may need to reduce your sodium intake to 1,500 mg a day.  Limit alcohol intake to no more than 1 drink a day for nonpregnant women and 2 drinks a day for men. One drink equals 12 oz of beer, 5 oz of wine, or 1 oz of hard liquor.  Work with your health care provider to maintain a healthy body weight or to lose weight. Ask what an ideal weight is for you.  Get at least 30 minutes of exercise that causes your heart to beat faster (aerobic exercise) most days of the week. Activities may include walking, swimming, or biking.  Work with your health care provider or diet and nutrition specialist (dietitian) to adjust your eating plan to your individual calorie needs. Reading food labels   Check food labels for the amount of sodium per serving. Choose foods with less than 5 percent of the Daily Value of sodium. Generally, foods with less than 300 mg of sodium per serving fit into this eating plan.  To find whole grains, look for the word "whole" as the first word in the ingredient list. Shopping  Buy products labeled as "low-sodium" or "no salt added."  Buy fresh foods. Avoid canned foods and premade or frozen meals. Cooking  Avoid adding salt when cooking. Use salt-free seasonings or herbs instead of table salt or sea salt. Check with your health care provider or pharmacist before using salt substitutes.  Do not fry foods. Cook foods using healthy methods such as baking, boiling, grilling, and broiling instead.  Cook with  heart-healthy oils, such as olive, canola, soybean, or sunflower oil. Meal planning  Eat a balanced diet that includes: ? 5 or more servings of fruits and vegetables each day. At each meal, try to fill half of your plate with fruits and vegetables. ? Up to 6-8 servings of whole grains each day. ? Less than 6 oz of lean meat, poultry, or fish each day. A 3-oz serving of meat is about the same size as a deck of cards. One egg equals 1 oz. ? 2 servings of low-fat dairy each day. ? A serving of nuts, seeds, or beans 5 times each week. ? Heart-healthy fats. Healthy fats called Omega-3 fatty acids are found in foods such as flaxseeds and coldwater fish, like sardines, salmon, and mackerel.  Limit how much you eat of the following: ? Canned or prepackaged foods. ? Food that is high in trans fat, such as fried foods. ? Food that is high in saturated fat, such as fatty meat. ? Sweets, desserts, sugary drinks, and other foods with added sugar. ? Full-fat dairy products.  Do not salt foods before eating.  Try to eat at least 2 vegetarian meals each week.  Eat more home-cooked food and less restaurant, buffet, and fast food.  When eating at a restaurant, ask that your food be prepared with less salt or no salt, if possible. What foods are recommended? The items listed may not be a complete list. Talk with your dietitian about   what dietary choices are best for you. Grains Whole-grain or whole-wheat bread. Whole-grain or whole-wheat pasta. Brown rice. Oatmeal. Quinoa. Bulgur. Whole-grain and low-sodium cereals. Pita bread. Low-fat, low-sodium crackers. Whole-wheat flour tortillas. Vegetables Fresh or frozen vegetables (raw, steamed, roasted, or grilled). Low-sodium or reduced-sodium tomato and vegetable juice. Low-sodium or reduced-sodium tomato sauce and tomato paste. Low-sodium or reduced-sodium canned vegetables. Fruits All fresh, dried, or frozen fruit. Canned fruit in natural juice (without  added sugar). Meat and other protein foods Skinless chicken or turkey. Ground chicken or turkey. Pork with fat trimmed off. Fish and seafood. Egg whites. Dried beans, peas, or lentils. Unsalted nuts, nut butters, and seeds. Unsalted canned beans. Lean cuts of beef with fat trimmed off. Low-sodium, lean deli meat. Dairy Low-fat (1%) or fat-free (skim) milk. Fat-free, low-fat, or reduced-fat cheeses. Nonfat, low-sodium ricotta or cottage cheese. Low-fat or nonfat yogurt. Low-fat, low-sodium cheese. Fats and oils Soft margarine without trans fats. Vegetable oil. Low-fat, reduced-fat, or light mayonnaise and salad dressings (reduced-sodium). Canola, safflower, olive, soybean, and sunflower oils. Avocado. Seasoning and other foods Herbs. Spices. Seasoning mixes without salt. Unsalted popcorn and pretzels. Fat-free sweets. What foods are not recommended? The items listed may not be a complete list. Talk with your dietitian about what dietary choices are best for you. Grains Baked goods made with fat, such as croissants, muffins, or some breads. Dry pasta or rice meal packs. Vegetables Creamed or fried vegetables. Vegetables in a cheese sauce. Regular canned vegetables (not low-sodium or reduced-sodium). Regular canned tomato sauce and paste (not low-sodium or reduced-sodium). Regular tomato and vegetable juice (not low-sodium or reduced-sodium). Pickles. Olives. Fruits Canned fruit in a light or heavy syrup. Fried fruit. Fruit in cream or butter sauce. Meat and other protein foods Fatty cuts of meat. Ribs. Fried meat. Bacon. Sausage. Bologna and other processed lunch meats. Salami. Fatback. Hotdogs. Bratwurst. Salted nuts and seeds. Canned beans with added salt. Canned or smoked fish. Whole eggs or egg yolks. Chicken or turkey with skin. Dairy Whole or 2% milk, cream, and half-and-half. Whole or full-fat cream cheese. Whole-fat or sweetened yogurt. Full-fat cheese. Nondairy creamers. Whipped toppings.  Processed cheese and cheese spreads. Fats and oils Butter. Stick margarine. Lard. Shortening. Ghee. Bacon fat. Tropical oils, such as coconut, palm kernel, or palm oil. Seasoning and other foods Salted popcorn and pretzels. Onion salt, garlic salt, seasoned salt, table salt, and sea salt. Worcestershire sauce. Tartar sauce. Barbecue sauce. Teriyaki sauce. Soy sauce, including reduced-sodium. Steak sauce. Canned and packaged gravies. Fish sauce. Oyster sauce. Cocktail sauce. Horseradish that you find on the shelf. Ketchup. Mustard. Meat flavorings and tenderizers. Bouillon cubes. Hot sauce and Tabasco sauce. Premade or packaged marinades. Premade or packaged taco seasonings. Relishes. Regular salad dressings. Where to find more information:  National Heart, Lung, and Blood Institute: www.nhlbi.nih.gov  American Heart Association: www.heart.org Summary  The DASH eating plan is a healthy eating plan that has been shown to reduce high blood pressure (hypertension). It may also reduce your risk for type 2 diabetes, heart disease, and stroke.  With the DASH eating plan, you should limit salt (sodium) intake to 2,300 mg a day. If you have hypertension, you may need to reduce your sodium intake to 1,500 mg a day.  When on the DASH eating plan, aim to eat more fresh fruits and vegetables, whole grains, lean proteins, low-fat dairy, and heart-healthy fats.  Work with your health care provider or diet and nutrition specialist (dietitian) to adjust your eating plan to your   individual calorie needs. This information is not intended to replace advice given to you by your health care provider. Make sure you discuss any questions you have with your health care provider. Document Released: 12/30/2010 Document Revised: 01/04/2016 Document Reviewed: 01/04/2016 Elsevier Interactive Patient Education  2019 [employer] Fat and Cholesterol Restricted Eating Plan Getting too much fat and cholesterol in your diet  may cause health problems. Choosing the right foods helps keep your fat and cholesterol at normal levels. This can keep you from getting certain diseases. Your doctor may recommend an eating plan that includes:  Total fat: ______% or less of total calories a day.  Saturated fat: ______% or less of total calories a day.  Cholesterol: less than _________mg a day.  Fiber: ______g a day. What are tips for following this plan? Meal planning  At meals, divide your plate into four equal parts: ? Fill one-half of your plate with vegetables and green salads. ? Fill one-fourth of your plate with whole grains. ? Fill one-fourth of your plate with low-fat (lean) protein foods.  Eat fish that is high in omega-3 fats at least two times a week. This includes mackerel, tuna, sardines, and salmon.  Eat foods that are high in fiber, such as whole grains, beans, apples, broccoli, carrots, peas, and barley. General tips   Work with your doctor to lose weight if you need to.  Avoid: ? Foods with added sugar. ? Fried foods. ? Foods with partially hydrogenated oils.  Limit alcohol intake to no more than 1 drink a day for nonpregnant women and 2 drinks a day for men. One drink equals 12 oz of beer, 5 oz of wine, or 1 oz of hard liquor. Reading food labels  Check food labels for: ? Trans fats. ? Partially hydrogenated oils. ? Saturated fat (g) in each serving. ? Cholesterol (mg) in each serving. ? Fiber (g) in each serving.  Choose foods with healthy fats, such as: ? Monounsaturated fats. ? Polyunsaturated fats. ? Omega-3 fats.  Choose grain products that have whole grains. Look for the word "whole" as the first word in the ingredient list. Cooking  Cook foods using low-fat methods. These include baking, boiling, grilling, and broiling.  Eat more home-cooked foods. Eat at restaurants and buffets less often.  Avoid cooking using saturated fats, such as butter, cream, palm oil, palm kernel  oil, and coconut oil. Recommended foods  Fruits  All fresh, canned (in natural juice), or frozen fruits. Vegetables  Fresh or frozen vegetables (raw, steamed, roasted, or grilled). Green salads. Grains  Whole grains, such as whole wheat or whole grain breads, crackers, cereals, and pasta. Unsweetened oatmeal, bulgur, barley, quinoa, or brown rice. Corn or whole wheat flour tortillas. Meats and other protein foods  Ground beef (85% or leaner), grass-fed beef, or beef trimmed of fat. Skinless chicken or turkey. Ground chicken or turkey. Pork trimmed of fat. All fish and seafood. Egg whites. Dried beans, peas, or lentils. Unsalted nuts or seeds. Unsalted canned beans. Nut butters without added sugar or oil. Dairy  Low-fat or nonfat dairy products, such as skim or 1% milk, 2% or reduced-fat cheeses, low-fat and fat-free ricotta or cottage cheese, or plain low-fat and nonfat yogurt. Fats and oils  Tub margarine without trans fats. Light or reduced-fat mayonnaise and salad dressings. Avocado. Olive, canola, sesame, or safflower oils. The items listed above may not be a complete list of foods and beverages you can eat. Contact a dietitian for more information. Foods   to avoid Fruits  Canned fruit in heavy syrup. Fruit in cream or butter sauce. Fried fruit. Vegetables  Vegetables cooked in cheese, cream, or butter sauce. Fried vegetables. Grains  White bread. White pasta. White rice. Cornbread. Bagels, pastries, and croissants. Crackers and snack foods that contain trans fat and hydrogenated oils. Meats and other protein foods  Fatty cuts of meat. Ribs, chicken wings, bacon, sausage, bologna, salami, chitterlings, fatback, hot dogs, bratwurst, and packaged lunch meats. Liver and organ meats. Whole eggs and egg yolks. Chicken and turkey with skin. Fried meat. Dairy  Whole or 2% milk, cream, half-and-half, and cream cheese. Whole milk cheeses. Whole-fat or sweetened yogurt. Full-fat  cheeses. Nondairy creamers and whipped toppings. Processed cheese, cheese spreads, and cheese curds. Beverages  Alcohol. Sugar-sweetened drinks such as sodas, lemonade, and fruit drinks. Fats and oils  Butter, stick margarine, lard, shortening, ghee, or bacon fat. Coconut, palm kernel, and palm oils. Sweets and desserts  Corn syrup, sugars, honey, and molasses. Candy. Jam and jelly. Syrup. Sweetened cereals. Cookies, pies, cakes, donuts, muffins, and ice cream. The items listed above may not be a complete list of foods and beverages you should avoid. Contact a dietitian for more information. Summary  Choosing the right foods helps keep your fat and cholesterol at normal levels. This can keep you from getting certain diseases.  At meals, fill one-half of your plate with vegetables and green salads.  Eat high-fiber foods, like whole grains, beans, apples, carrots, peas, and barley.  Limit added sugar, saturated fats, alcohol, and fried foods. This information is not intended to replace advice given to you by your health care provider. Make sure you discuss any questions you have with your health care provider. Document Released: 07/12/2011 Document Revised: 09/13/2017 Document Reviewed: 09/27/2016 Elsevier Interactive Patient Education  2019 Elsevier Inc.  

## 2018-03-14 NOTE — Progress Notes (Signed)
Established Patient Office Visit  Subjective:  Patient ID: Alicia Gibson, female    DOB: 05/04/1960  Age: 58 y.o. MRN: [id number]  CC:  Chief Complaint  Patient presents with  . Follow-up    review cholesterol medication    HPI Alicia Gibson presents for follow up on hypertension, copd, hyperlipidemia. She reports that she uses 2 L oxygen continuously and 3 L with activities and she admits to having shortness of breath with activities such as vacuuming, and uses albuterol as needed. She states that she has intermittent wheezes sometimes.  She states that she continues to take 5 mg Amlodipine for hypertension and does not monitor blood pressure at home. she denies light headedness, chest pain, palpitation and peripheral edema. She reports that her headache has improved since starting Amlodipine.  She reports that she takes 20 mg of Atorvastatin, and states that she has 4/5 myalgia, though she takes Flexeril 10 mg daily for muscle spasm to her thoracic area and neck due to Copd. She states that she will notify provider of worsening myalgia and will continue on low fat low cholesterol diet. Otherwise she denies any further concern.   Past Medical History:  Diagnosis Date  . COPD (chronic obstructive pulmonary disease) (HCC)   . Hyperlipidemia   . Hypertension     Past Surgical History:  Procedure Laterality Date  . ABDOMINAL HYSTERECTOMY    . FRACTURE SURGERY      No family history on file.  Social History   Socioeconomic History  . Marital status: Divorced    Spouse name: Not on file  . Number of children: Not on file  . Years of education: Not on file  . Highest education level: Not on file  Occupational History  . Not on file  Social Needs  . Financial resource strain: Not on file  . Food insecurity:    Worry: Not on file    Inability: Not on file  . Transportation needs:    Medical: Not on file    Non-medical: Not on file  Tobacco Use  . Smoking status:  Current Some Day Smoker    Types: Cigarettes  . Smokeless tobacco: Never Used  Substance and Sexual Activity  . Alcohol use: Never    Frequency: Never  . Drug use: Never  . Sexual activity: Not on file  Lifestyle  . Physical activity:    Days per week: Not on file    Minutes per session: Not on file  . Stress: Not on file  Relationships  . Social connections:    Talks on phone: Not on file    Gets together: Not on file    Attends religious service: Not on file    Active member of club or organization: Not on file    Attends meetings of clubs or organizations: Not on file    Relationship status: Not on file  . Intimate partner violence:    Fear of current or ex partner: Not on file    Emotionally abused: Not on file    Physically abused: Not on file    Forced sexual activity: Not on file  Other Topics Concern  . Not on file  Social History Narrative  . Not on file    Outpatient Medications Prior to Visit  Medication Sig Dispense Refill  . albuterol (PROVENTIL HFA;VENTOLIN HFA) 108 (90 Base) MCG/ACT inhaler Inhale 2 puffs into the lungs every 6 (six) hours as needed for wheezing or shortness of breath. 1 Inhaler  1  . albuterol (PROVENTIL) (2.5 MG/3ML) 0.083% nebulizer solution Take 3 mLs (2.5 mg total) by nebulization 4 (four) times daily. 75 mL 3  . amLODipine (NORVASC) 5 MG tablet Take 1 tablet (5 mg total) by mouth daily. 30 tablet 1  . aspirin EC 81 MG tablet Take 81 mg by mouth daily.    Marland Kitchen atorvastatin (LIPITOR) 20 MG tablet Take 1 tablet (20 mg total) by mouth daily. 30 tablet 0  . cyclobenzaprine (FLEXERIL) 10 MG tablet Take 1 tablet (10 mg total) by mouth 2 (two) times daily as needed for muscle spasms. 60 tablet 3  . fluticasone furoate-vilanterol (BREO ELLIPTA) 200-25 MCG/INH AEPB Inhale 1 puff into the lungs daily. 1 each 3  . guaiFENesin (MUCINEX) 600 MG 12 hr tablet Take 1,200 mg by mouth 2 (two) times daily as needed for cough or to loosen phlegm.    . Multiple  Vitamin (MULTIVITAMIN WITH MINERALS) TABS tablet Take 1 tablet by mouth daily.    . Tiotropium Bromide Monohydrate 2.5 MCG/ACT AERS Inhale 2 puffs into the lungs at bedtime. 4 g 3   Facility-Administered Medications Prior to Visit  Medication Dose Route Frequency Provider Last Rate Last Dose  . ibuprofen (ADVIL,MOTRIN) tablet 200 mg  200 mg Oral Q6H PRN Jakhai Fant E, NP        Allergies  Allergen Reactions  . Codeine Hives and Nausea And Vomiting  . Shellfish Allergy Hives  . Penicillins Hives and Rash    ROS Review of Systems  Constitutional: Positive for fatigue.  HENT: Negative.   Respiratory: Positive for shortness of breath (with exertion) and wheezing (sometimes).   Cardiovascular: Negative.   Gastrointestinal: Negative.   Genitourinary: Negative.   Musculoskeletal: Myalgias: 4/5 muscle aches.  Skin: Negative.   Neurological: Negative.   Psychiatric/Behavioral: Negative.       Objective:    Physical Exam  Constitutional: She is oriented to person, place, and time. She appears well-developed.  HENT:  Head: Normocephalic and atraumatic.  Eyes: Pupils are equal, round, and reactive to light. EOM are normal.  Neck: Normal range of motion.  Cardiovascular: Normal rate, regular rhythm and normal heart sounds.  Pulmonary/Chest: Effort normal and breath sounds normal. No respiratory distress. She has no wheezes. She has no rales. She exhibits no tenderness.  Abdominal: Soft. Bowel sounds are normal.  Musculoskeletal: Normal range of motion.  Neurological: She is alert and oriented to person, place, and time.  Skin: Skin is warm.  Psychiatric: She has a normal mood and affect. Her behavior is normal. Judgment and thought content normal.    BP 128/79 (BP Location: Left Arm, Patient Position: Sitting)   Pulse 98   Ht 5\' 6"  (1.676 m)   Wt 116 lb 6.4 oz (52.8 kg)   SpO2 95%   BMI 18.79 kg/m  Wt Readings from Last 3 Encounters:  03/14/18 116 lb 6.4 oz (52.8 kg)   02/22/18 113 lb (51.3 kg)  02/01/18 112 lb 8 oz (51 kg)     Health Maintenance Due  Topic Date Due  . HIV Screening  10/24/1976  . TETANUS/TDAP  10/24/1980  . PAP SMEAR-Modifier  10/25/1982  . MAMMOGRAM  10/25/2011  . COLONOSCOPY  10/25/2011  . INFLUENZA VACCINE  08/24/2017   She states that she will follow up with her new provider with her health maintenance schedule. There are no preventive care reminders to display for this patient.  Lab Results  Component Value Date   TSH 2.310 02/01/2018  Lab Results  Component Value Date   WBC 7.6 02/01/2018   HGB 16.1 (H) 02/01/2018   HCT 47.8 (H) 02/01/2018   MCV 91 02/01/2018   PLT 276 02/01/2018   Lab Results  Component Value Date   NA 143 02/07/2018   K 5.1 02/07/2018   CO2 26 02/07/2018   GLUCOSE 86 02/07/2018   BUN 15 02/07/2018   CREATININE 0.89 02/07/2018   BILITOT 0.2 02/07/2018   ALKPHOS 95 02/07/2018   AST 22 02/07/2018   ALT 50 (H) 02/07/2018   PROT 7.0 02/07/2018   ALBUMIN 4.6 02/07/2018   CALCIUM 10.0 02/07/2018   ANIONGAP 9 12/06/2017   Lab Results  Component Value Date   CHOL 328 (H) 02/13/2018   Lab Results  Component Value Date   HDL 74 02/13/2018   Lab Results  Component Value Date   LDLCALC 222 (H) 02/13/2018   Lab Results  Component Value Date   TRIG 161 (H) 02/13/2018   She was advised to continue on 20 mg Atorvastatin and to notify provider of worsening Myalgia. She's to continue on low fat and low cholesterol diet.  Lab Results  Component Value Date   CHOLHDL 4.4 02/13/2018   No results found for: HGBA1C    Assessment & Plan:   Problem List Items Addressed This Visit    None     1. Essential hypertension - Her blood pressure is within normal limits, her goal is < 140/90. She will continue on current treatment regimen. She was encouraged to continue on low salt diet, check and document blood pressure. - amLODipine (NORVASC) 5 MG tablet; Take 1 tablet (5 mg total) by mouth  daily.  Dispense: 30 tablet; Refill: 1  2. Elevated lipids - She will continue on current treatment regimen, will continue on low fat low cholesterol diet, will notify provider of worsening Myalgia. Lipid panel will be rechecked in 2 months. - atorvastatin (LIPITOR) 20 MG tablet; Take 1 tablet (20 mg total) by mouth daily.  Dispense: 30 tablet; Refill: 0  3. Chronic bronchitis with COPD (chronic obstructive pulmonary disease) (HCC) - She will continue on current treatment regimen, and use 2 L oxygen.  No orders of the defined types were placed in this encounter.   Follow-up: No follow-ups on file.  She has medicaid and will schedule an appointment with a new provider.  Jamyah Folk Trellis Paganini, NP

## 2018-03-21 ENCOUNTER — Telehealth: Payer: Self-pay | Admitting: Pharmacist

## 2018-03-21 NOTE — Telephone Encounter (Signed)
03/21/2018 1:57:58 PM - [employer] approval for Spiriva Respimat  03/21/2018 Received [employer] approval for Spiriva Respimat till 03/19/2019.AJ  This medication may be shipped to patient's home-patient is responsible for bringing [name] invoice when she receives.Forde Radon

## 2018-04-02 ENCOUNTER — Encounter: Payer: Self-pay | Admitting: Nurse Practitioner

## 2018-04-02 ENCOUNTER — Other Ambulatory Visit: Payer: Self-pay

## 2018-04-02 ENCOUNTER — Ambulatory Visit: Payer: Medicaid Other | Admitting: Nurse Practitioner

## 2018-04-02 VITALS — BP 124/82 | HR 113 | Temp 97.8°F | Resp 17 | Ht 66.0 in | Wt 115.4 lb

## 2018-04-02 DIAGNOSIS — F329 Major depressive disorder, single episode, unspecified: Secondary | ICD-10-CM

## 2018-04-02 DIAGNOSIS — F32A Depression, unspecified: Secondary | ICD-10-CM

## 2018-04-02 DIAGNOSIS — J301 Allergic rhinitis due to pollen: Secondary | ICD-10-CM

## 2018-04-02 DIAGNOSIS — Z7689 Persons encountering health services in other specified circumstances: Secondary | ICD-10-CM

## 2018-04-02 DIAGNOSIS — E785 Hyperlipidemia, unspecified: Secondary | ICD-10-CM

## 2018-04-02 DIAGNOSIS — J4489 Other specified chronic obstructive pulmonary disease: Secondary | ICD-10-CM

## 2018-04-02 DIAGNOSIS — I10 Essential (primary) hypertension: Secondary | ICD-10-CM | POA: Diagnosis not present

## 2018-04-02 DIAGNOSIS — F419 Anxiety disorder, unspecified: Secondary | ICD-10-CM | POA: Diagnosis not present

## 2018-04-02 DIAGNOSIS — M62838 Other muscle spasm: Secondary | ICD-10-CM | POA: Diagnosis not present

## 2018-04-02 DIAGNOSIS — J449 Chronic obstructive pulmonary disease, unspecified: Secondary | ICD-10-CM | POA: Diagnosis not present

## 2018-04-02 MED ORDER — ATORVASTATIN CALCIUM 20 MG PO TABS: 20.0000 mg | ORAL_TABLET | Freq: Every day | ORAL | 3 refills | Status: DC

## 2018-04-02 MED ORDER — CETIRIZINE HCL 10 MG PO TABS: 10.0000 mg | ORAL_TABLET | Freq: Every day | ORAL | 5 refills | Status: DC

## 2018-04-02 MED ORDER — AMLODIPINE BESYLATE 5 MG PO TABS: 5.0000 mg | ORAL_TABLET | Freq: Every day | ORAL | 3 refills | Status: DC

## 2018-04-02 MED ORDER — CYCLOBENZAPRINE HCL 10 MG PO TABS: 10.0000 mg | ORAL_TABLET | Freq: Two times a day (BID) | ORAL | 3 refills | Status: DC | PRN

## 2018-04-02 MED ORDER — SERTRALINE HCL 50 MG PO TABS: 50.0000 mg | ORAL_TABLET | Freq: Every day | ORAL | 3 refills | Status: DC

## 2018-04-02 MED ORDER — FLUTICASONE FUROATE-VILANTEROL 200-25 MCG/INH IN AEPB: 1.0000 | INHALATION_SPRAY | Freq: Every day | RESPIRATORY_TRACT | 3 refills | Status: DC

## 2018-04-02 MED ORDER — HYDROXYZINE HCL 10 MG PO TABS: 10.0000 mg | ORAL_TABLET | Freq: Three times a day (TID) | ORAL | 1 refills | Status: DC | PRN

## 2018-04-02 NOTE — Progress Notes (Signed)
Subjective:    Patient ID: Alicia Gibson, female    DOB: 09/28/1960, 58 y.o.   MRN: [id number]  Alicia Gibson is a 58 y.o. female presenting on 04/02/2018 for Establish Care (recently relocated [facility]. Pt need refill of medications) and Headache (pt states it started off with vomiting and diarrhea for 24 hrs. That subsided and now persistent headache x 3 days )   HPI Establish Care New Provider Pt last seen by PCP Dr. Ramonita Lab in 6-9 months.  Contact phone [phone] if needed.  Was admitted to hospital there for colitis about 2 years ago.  Obtain records.   - Recently approved for medicare/medicaid disability. - Moved to [place] with her sisters.  Lived out of a camper for a while, but was hot and difficult to manage with oxygen.  Moved back here with daughter and grandson. - Was at [facility] for several months until about 1 month ago.  Headache  Patient has had 3 days of headache.  Initial 24 hrs also accompanied with diarrhea, n/v which has resolved.  COPD Patient is currently maintained on continuous O2 provided/serviced by [facility] in [place] - Patient has been on home O2 since July 2019.  Has not been able to reduce usage.  Saw lung specialist in [facility], but has not seen pulmonology here.  Insomnia - wakes up panicking a lot.  Notes some increased anxiety, has not been on baseline treatment in recent months.  She has had significant increase in anxiety after recent situational changes regarding housing/living situations and her move from [facility] to [place].     Social History   Tobacco Use  . Smoking status: Current Some Day Smoker    Packs/day: 0.85    Types: Cigarettes  . Smokeless tobacco: Never Used  Substance Use Topics  . Alcohol use: Never    Frequency: Never  . Drug use: Never    Review of Systems Per HPI unless specifically indicated above     Objective:    Pulse (!) 113   Temp 97.8 F (36.6 C) (Oral)   Resp 17   Ht 5'  6" (1.676 m)   Wt 115 lb 6.4 oz (52.3 kg)   SpO2 96%   BMI 18.63 kg/m   Wt Readings from Last 3 Encounters:  04/02/18 115 lb 6.4 oz (52.3 kg)  03/14/18 116 lb 6.4 oz (52.8 kg)  02/22/18 113 lb (51.3 kg)    Physical Exam Vitals signs reviewed.  Constitutional:      General: She is not in acute distress.    Appearance: She is well-developed.  HENT:     Head: Normocephalic and atraumatic.  Cardiovascular:     Rate and Rhythm: Normal rate and regular rhythm.     Pulses:          Radial pulses are 2+ on the right side and 2+ on the left side.       Posterior tibial pulses are 1+ on the right side and 1+ on the left side.     Heart sounds: Normal heart sounds, S1 normal and S2 normal.  Pulmonary:     Effort: Pulmonary effort is normal. No respiratory distress.     Breath sounds: Normal breath sounds and air entry.  Musculoskeletal:     Right lower leg: No edema.     Left lower leg: No edema.  Skin:    General: Skin is warm and dry.     Capillary  Refill: Capillary refill takes less than 2 seconds.  Neurological:     Mental Status: She is alert and oriented to person, place, and time.  Psychiatric:        Attention and Perception: Attention normal.        Mood and Affect: Mood and affect normal.        Behavior: Behavior normal. Behavior is cooperative.    Results for orders placed or performed in visit on 02/13/18  Lipid Profile  Result Value Ref Range   Cholesterol, Total 328 (H) 100 - 199 mg/dL   Triglycerides 914 (H) 0 - 149 mg/dL   HDL 74 >78 mg/dL   VLDL Cholesterol Cal 32 5 - 40 mg/dL   LDL Calculated 295 (H) 0 - 99 mg/dL   Comment: Comment    Chol/HDL Ratio 4.4 0.0 - 4.4 ratio      Assessment & Plan:   Problem List Items Addressed This Visit    None    Visit Diagnoses    Encounter to establish care     Previous PCP was in [facility].  Records will be requested.  Past medical, family, and surgical history reviewed w/ pt.     Elevated lipids     Patient  continues on atorvastatin.  No recent status assessment of lipids.  Labs today.  Refills provided.  FOLLOW-UP 3-6 months.   Relevant Medications   atorvastatin (LIPITOR) 20 MG tablet   Essential hypertension   Controlled hypertension.  BP goal < 130/80.  Pt is trying to work on lifestyle modifications.  Taking medications tolerating well without side effects. No currently identified complications.  Plan: 1. Continue taking medications without changes. 2. Obtain labs next appt.  3. Encouraged heart healthy diet and increasing exercise to 30 minutes most days of the week. 4. Check BP 1-2 x per week at home, keep log, and bring to clinic at next appointment. 5. Follow up 3 months.       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  These are debilitating and impact patient's ability to continue getting enough oxygen.   Continue Flexeril 10 mg for bedtime. Follow-up 3 months prn.   Relevant Medications   cyclobenzaprine (FLEXERIL) 10 MG tablet   Chronic bronchitis with COPD (chronic obstructive pulmonary disease) (HCC)     Stable currently.  Patient notes tolerance of Breo.  Has not taken yet today.  Patient is not seeing improvement at home to date with home O2.   Patient is likely near end of therapy options for primary care.  May need to establish with pulmonology soonl - Continue home O2 - Continue Breo or equivalent - START cetirizine 10 mg onc3 daily - Follow-up 3 months.   Relevant Medications   cetirizine (ZYRTEC) 10 MG tablet   Anxiety and depression    -  Primary Mildly worsening situationally.  Uncontrolled anxiety worsens panic/insomia.  START hydroxyzine for insomnia and anxiety.  Start sertraline 50 mg daily.  Followup 6 months.   Relevant Medications   hydrOXYzine (ATARAX/VISTARIL) 10 MG tablet   sertraline (ZOLOFT) 50 MG  tablet   Seasonal allergic rhinitis due to pollen     Consistent with chronic allergic rhinitis, non-seasonal, unknown triggers. - Today no evidence of acute sinusitis or complication.  - Has not recently used loratadine, cetirizine, flonase  Plan:  1. Start nasal fluticasone 2 sprays each nostril once daily for 4 weeks. 2. START antihistamine cetirizine 10 mg once daily. 3. Can continue saline rinses up to twice daily if needed.  Use at alternate time from fluticasone. 4. Follow-up in future, as needed, consider 2nd opinion from ENT/allergy.    Relevant Medications   cetirizine (ZYRTEC) 10 MG tablet      Meds ordered this encounter  Medications  . atorvastatin (LIPITOR) 20 MG tablet    Sig: Take 1 tablet (20 mg total) by mouth daily.    Dispense:  30 tablet    Refill:  3  . amLODipine (NORVASC) 5 MG tablet    Sig: Take 1 tablet (5 mg total) by mouth daily.    Dispense:  30 tablet    Refill:  3  . cyclobenzaprine (FLEXERIL) 10 MG tablet    Sig: Take 1 tablet (10 mg total) by mouth 2 (two) times daily as needed for muscle spasms (around lungs).    Dispense:  60 tablet    Refill:  3  . DISCONTD: fluticasone furoate-vilanterol (BREO ELLIPTA) 200-25 MCG/INH AEPB    Sig: Inhale 1 puff into the lungs daily.    Dispense:  1 each    Refill:  3  . hydrOXYzine (ATARAX/VISTARIL) 10 MG tablet    Sig: Take 1-2 tablets (10-20 mg total) by mouth 3 (three) times daily as needed for anxiety (insomnia).    Dispense:  60 tablet    Refill:  1    Order Specific Question:   Supervising Provider    Answer:   Smitty Cords [[id number]]  . sertraline (ZOLOFT) 50 MG tablet    Sig: Take 1 tablet (50 mg total) by mouth daily. Take 1/2 pill for 6-8 days.    Dispense:  30 tablet    Refill:  3    Order Specific Question:   Supervising Provider    Answer:   Smitty Cords [[id number]]  . cetirizine (ZYRTEC) 10 MG tablet    Sig: Take 1 tablet (10 mg total) by mouth daily.    Dispense:  30  tablet    Refill:  5    Order Specific Question:   Supervising Provider    Answer:   Smitty Cords [[id number]]   Follow up plan: Return in about 6 weeks (around 05/14/2018) for anxiety, depression.  Wilhelmina Mcardle, DNP, AGPCNP-BC Adult Gerontology Primary Care Nurse Practitioner [facility] 04/02/2018, 2:33 PM

## 2018-04-02 NOTE — Patient Instructions (Signed)
Alicia Gibson,   Thank you for coming in to clinic today.  1. START allergy pill - cetirizine Zyrtec 10 mg once daily for sinus pressure  2. Drink at least 64 oz water daily to stay well hydrated.  3. Continue ibuprofen for headache as needed.  4. For depression,  - START sertraline 50 mg tablet (Zoloft) - Take 1/2 tablet for 1 week. Then increase to whole tablet  - For anxiety, panic or insomnia - START hydroxyzine 10 mg tablet.  Take 1-2 tablets (10-20 mg) up to three times daily as needed for anxiety or insomnia.  Please schedule a follow-up appointment with Wilhelmina Mcardle, AGNP. Return in about 6 weeks (around 05/14/2018) for anxiety, depression.  If you have any other questions or concerns, please feel free to call the clinic or send a message through MyChart. You may also schedule an earlier appointment if necessary.  You will receive a survey after today's visit either digitally by e-mail or paper by [employer]. Your experiences and feedback matter to [name].  Please respond so we know how we are doing as we provide care for you.   Wilhelmina Mcardle, DNP, AGNP-BC Adult Gerontology Nurse Practitioner [facility]

## 2018-04-03 ENCOUNTER — Other Ambulatory Visit: Payer: Self-pay

## 2018-04-03 ENCOUNTER — Other Ambulatory Visit: Payer: Self-pay | Admitting: Nurse Practitioner

## 2018-04-03 DIAGNOSIS — J449 Chronic obstructive pulmonary disease, unspecified: Secondary | ICD-10-CM

## 2018-04-03 MED ORDER — FLUTICASONE-SALMETEROL 230-21 MCG/ACT IN AERO: 2.0000 | INHALATION_SPRAY | Freq: Two times a day (BID) | RESPIRATORY_TRACT | 12 refills | Status: DC

## 2018-04-09 ENCOUNTER — Encounter: Payer: Self-pay | Admitting: Nurse Practitioner

## 2018-04-13 ENCOUNTER — Other Ambulatory Visit: Payer: Self-pay | Admitting: Nurse Practitioner

## 2018-04-13 DIAGNOSIS — J449 Chronic obstructive pulmonary disease, unspecified: Secondary | ICD-10-CM

## 2018-04-13 MED ORDER — FLUTICASONE-SALMETEROL 250-50 MCG/DOSE IN AEPB: 1.0000 | INHALATION_SPRAY | Freq: Two times a day (BID) | RESPIRATORY_TRACT | 5 refills | Status: DC

## 2018-04-19 ENCOUNTER — Other Ambulatory Visit: Payer: Self-pay | Admitting: Nurse Practitioner

## 2018-04-19 DIAGNOSIS — J449 Chronic obstructive pulmonary disease, unspecified: Secondary | ICD-10-CM

## 2018-04-19 MED ORDER — ALBUTEROL SULFATE HFA 108 (90 BASE) MCG/ACT IN AERS: 2.0000 | INHALATION_SPRAY | Freq: Four times a day (QID) | RESPIRATORY_TRACT | 1 refills | Status: DC | PRN

## 2018-04-19 NOTE — Telephone Encounter (Signed)
Pt needs a refill on ventlin sent to [facility]

## 2018-05-14 ENCOUNTER — Encounter: Payer: Self-pay | Admitting: Nurse Practitioner

## 2018-05-14 ENCOUNTER — Ambulatory Visit (INDEPENDENT_AMBULATORY_CARE_PROVIDER_SITE_OTHER): Payer: Medicaid Other | Admitting: Nurse Practitioner

## 2018-05-14 ENCOUNTER — Other Ambulatory Visit: Payer: Self-pay

## 2018-05-14 DIAGNOSIS — J449 Chronic obstructive pulmonary disease, unspecified: Secondary | ICD-10-CM

## 2018-05-14 DIAGNOSIS — F329 Major depressive disorder, single episode, unspecified: Secondary | ICD-10-CM

## 2018-05-14 DIAGNOSIS — F32A Depression, unspecified: Secondary | ICD-10-CM

## 2018-05-14 DIAGNOSIS — I10 Essential (primary) hypertension: Secondary | ICD-10-CM | POA: Insufficient documentation

## 2018-05-14 DIAGNOSIS — F419 Anxiety disorder, unspecified: Secondary | ICD-10-CM

## 2018-05-14 DIAGNOSIS — J301 Allergic rhinitis due to pollen: Secondary | ICD-10-CM | POA: Insufficient documentation

## 2018-05-14 DIAGNOSIS — E785 Hyperlipidemia, unspecified: Secondary | ICD-10-CM | POA: Insufficient documentation

## 2018-05-14 MED ORDER — SERTRALINE HCL 100 MG PO TABS: 100.0000 mg | ORAL_TABLET | Freq: Every day | ORAL | 1 refills | Status: DC

## 2018-05-14 MED ORDER — HYDROXYZINE HCL 10 MG PO TABS: 10.0000 mg | ORAL_TABLET | Freq: Three times a day (TID) | ORAL | 1 refills | Status: DC | PRN

## 2018-05-14 NOTE — Progress Notes (Signed)
Telemedicine Encounter: Disclosed to patient at start of encounter that we will provide appropriate telemedicine services.  Patient consents to be treated via phone prior to discussion. - Patient is at her home and is accessed via telephone. - Services are provided by Wilhelmina Mcardle from [facility].  Subjective:   Patient ID: Alicia Gibson, female    DOB: 12/23/1960, 58 y.o.   MRN: [id number]  Alicia Gibson is a 58 y.o. female presenting on 05/14/2018 for Anxiety; Depression; and COPD (chronic Cough, SOB)  HPI COPD  Patient notes she is continuing with her "usual" work of breathing and symptoms.  She continues regular oxygen.  Takes off occasionally to do some self care.  Continues getting shortness of breath with chores.   - Is using albuterol daily with rescue inhaler and neb. - Continues Spiriva and Advair daily - Allergy and sinus headaches are worsening symptoms some, but continues to have controlled respiratory symptoms. - Demand for oxygen remains, patient and I were hopeful this would be reduced given more stable living environment out of her camper in [place].    Anxiety and Depression Patient notes her medication for anxiety and depression is helping. Continues to get high anxiety at times due to pandemic concerns and being stuck at home.  Has been unable to see daughter and grandson.  Calls every other day.  Is limiting all exposures.   - Roommate is helping with this accountability to stay home.  Roommate is helping with obtaining necessary supplies/food. - Patient continues to take hydroxyzine when anxious and always at night when she sleeps 20 mg.  Patient is using for daytime anxiety rescue 10 mg about 2 times per week.  Effective at low dose during day for anxiety and for anxiety/sleep at bedtime with 20 mg dose.  Depression screen PHQ 2/9 05/14/2018  Decreased Interest 1  Down, Depressed, Hopeless 1  PHQ - 2 Score 2  Altered sleeping 2  Tired, decreased  energy 3  Change in appetite 1  Feeling bad or failure about yourself  1  Trouble concentrating 0  Moving slowly or fidgety/restless 0  Suicidal thoughts 0  PHQ-9 Score 9  Difficult doing work/chores Not difficult at all   GAD 7 : Generalized Anxiety Score 05/14/2018  Nervous, Anxious, on Edge 2  Control/stop worrying 3  Worry too much - different things 3  Trouble relaxing 2  Restless 0  Easily annoyed or irritable 2  Afraid - awful might happen 3  Total GAD 7 Score 15  Anxiety Difficulty Not difficult at all   Social History   Tobacco Use  . Smoking status: Current Some Day Smoker    Packs/day: 0.10    Types: Cigarettes  . Smokeless tobacco: Never Used  Substance Use Topics  . Alcohol use: Never    Frequency: Never  . Drug use: Never   Review of Systems Per HPI unless specifically indicated above    Objective:    There were no vitals taken for this visit.  Wt Readings from Last 3 Encounters:  04/02/18 115 lb 6.4 oz (52.3 kg)  03/14/18 116 lb 6.4 oz (52.8 kg)  02/22/18 113 lb (51.3 kg)    Physical Exam Patient remotely monitored.  Verbal communication appropriate.  Cognition normal.   Results for orders placed or performed in visit on 02/13/18  Lipid Profile  Result Value Ref Range   Cholesterol, Total 328 (H) 100 - 199 mg/dL   Triglycerides 330 (H) 0 - 149 mg/dL  HDL 74 >39 mg/dL   VLDL Cholesterol Cal 32 5 - 40 mg/dL   LDL Calculated 409222 (H) 0 - 99 mg/dL   Comment: Comment    Chol/HDL Ratio 4.4 0.0 - 4.4 ratio      Assessment & Plan:   Problem List Items Addressed This Visit    None    Visit Diagnoses     Chronic bronchitis with COPD (chronic obstructive pulmonary disease) (HCC)    -  Primary Chronic, severe oxygen-dependent COPD severity.  Gold class unknown without recent PFT.  Plan: 1. Continue existing medications (Spiriva and Advair for maintenance and Albuterol for rescue) - Attempted to transition to Trelegy, but was not approved by  insurance at last visit 2. Continue 2L [facility] home O2 3. Recommend future PFT - defer for now due to Covid-19 prevention 4. Referral pulmonology for second opinion and possible changes to therapy to optimize breathing status. 5. Encouraged patient to continue self-isolation for Covid-19 prevention strategies as she is at higher risk of pulmonary complications due to COPD 6. Follow-up 3 months.   Relevant Orders   Ambulatory referral to Pulmonology   Anxiety and depression     Stable today on exam.  Medications tolerated without side effects.  Continue with changes. INCREASE sertraline to 100 mg daily. Continue hydroxyzine 10-20 mg tid prn anxiety or insomnia.  Refills provided.  Encouraged ongoing non-pharm management strategies. Followup 3 months or sooner if needed.    Relevant Medications   sertraline (ZOLOFT) 100 MG tablet   hydrOXYzine (ATARAX/VISTARIL) 10 MG tablet      Meds ordered this encounter  Medications  . sertraline (ZOLOFT) 100 MG tablet    Sig: Take 1 tablet (100 mg total) by mouth daily.    Dispense:  90 tablet    Refill:  1    Order Specific Question:   Supervising Provider    Answer:   Smitty CordsKARAMALEGOS, ALEXANDER J [[id number]]  . hydrOXYzine (ATARAX/VISTARIL) 10 MG tablet    Sig: Take 1-2 tablets (10-20 mg total) by mouth 3 (three) times daily as needed for anxiety (insomnia).    Dispense:  180 tablet    Refill:  1    Order Specific Question:   Supervising Provider    Answer:   Smitty CordsKARAMALEGOS, ALEXANDER J [[id number]]    - Time spent in direct consultation with patient via telemedicine about above concerns: 14 minutes  Follow up plan: Return in about 3 months (around 08/13/2018) for COPD, anxiety, depression, hypertension.  Wilhelmina McardleLauren Dalya Maselli, DNP, AGPCNP-BC Adult Gerontology Primary Care Nurse Practitioner [facility] 05/14/2018, 9:20 AM

## 2018-07-22 ENCOUNTER — Other Ambulatory Visit: Payer: Self-pay | Admitting: Nurse Practitioner

## 2018-07-22 DIAGNOSIS — I10 Essential (primary) hypertension: Secondary | ICD-10-CM

## 2018-07-22 DIAGNOSIS — E785 Hyperlipidemia, unspecified: Secondary | ICD-10-CM

## 2018-07-31 ENCOUNTER — Telehealth: Payer: Self-pay | Admitting: Pulmonary Disease

## 2018-07-31 ENCOUNTER — Institutional Professional Consult (permissible substitution): Payer: Disability Insurance | Admitting: Pulmonary Disease

## 2018-07-31 NOTE — Telephone Encounter (Signed)
Called patient for COVID-19 pre-screening for in office visit.  Have you recently traveled any where out of the local area in the last 2 weeks? No  Have you been in close contact with a person diagnosed with COVID-19 or someone awaiting results within the last 2 weeks? No  Do you currently have any of the following symptoms? If so, when did they start? Cough (Yes- "long time)  Diarrhea (Yes- couple weeks)  Joint Pain Fever      Muscle Pain   Red eyes Shortness of breath (Yes  Abdominal pain             Vomiting Loss of smell    Rash    Sore Throat Headache    Weakness   Bruising or bleeding  Congested the last couple of days more than normal.  Okay to proceed with visit. (date)  / Needs to reschedule visit. (date)

## 2018-07-31 NOTE — Telephone Encounter (Signed)
Appt. Switched to virtual. Nothing further needed.

## 2018-07-31 NOTE — Telephone Encounter (Signed)
Appointment will need to be  switched to a virtual visit due to symptoms.

## 2018-08-01 ENCOUNTER — Institutional Professional Consult (permissible substitution): Payer: Disability Insurance | Admitting: Pulmonary Disease

## 2018-08-01 ENCOUNTER — Encounter: Payer: Self-pay | Admitting: Internal Medicine

## 2018-08-01 ENCOUNTER — Ambulatory Visit (INDEPENDENT_AMBULATORY_CARE_PROVIDER_SITE_OTHER): Payer: Medicaid Other | Admitting: Internal Medicine

## 2018-08-01 DIAGNOSIS — F17210 Nicotine dependence, cigarettes, uncomplicated: Secondary | ICD-10-CM

## 2018-08-01 DIAGNOSIS — R0602 Shortness of breath: Secondary | ICD-10-CM | POA: Diagnosis not present

## 2018-08-01 DIAGNOSIS — J9611 Chronic respiratory failure with hypoxia: Secondary | ICD-10-CM

## 2018-08-01 DIAGNOSIS — R062 Wheezing: Secondary | ICD-10-CM | POA: Diagnosis not present

## 2018-08-01 DIAGNOSIS — Z9981 Dependence on supplemental oxygen: Secondary | ICD-10-CM

## 2018-08-01 DIAGNOSIS — J441 Chronic obstructive pulmonary disease with (acute) exacerbation: Secondary | ICD-10-CM | POA: Diagnosis not present

## 2018-08-01 MED ORDER — PERFOROMIST 20 MCG/2ML IN NEBU: 20.0000 ug | INHALATION_SOLUTION | Freq: Two times a day (BID) | RESPIRATORY_TRACT | 10 refills | Status: DC

## 2018-08-01 MED ORDER — PREDNISONE 20 MG PO TABS: 40.0000 mg | ORAL_TABLET | Freq: Every day | ORAL | 0 refills | Status: DC

## 2018-08-01 MED ORDER — AZITHROMYCIN 250 MG PO TABS: ORAL_TABLET | ORAL | 0 refills | Status: DC

## 2018-08-01 MED ORDER — BUDESONIDE 0.5 MG/2ML IN SUSP: 0.5000 mg | Freq: Two times a day (BID) | RESPIRATORY_TRACT | 10 refills | Status: DC

## 2018-08-01 NOTE — Patient Instructions (Addendum)
STOP ADVAIR STOP SPIRIVA  START PULMICORT NEBULIZER TWICE DAILY START PERFOROMIST NEBULIZER TWICE DAILY  CONTINUE OXYGEN  STOP SMOKING!!  LUNG CANCER SCREENING REFERAL  PREDNISONE 40 mg daily for 10 days Z pak

## 2018-08-01 NOTE — Progress Notes (Signed)
Name: Alicia Gibson MRN: [id number] DOB: 12/17/1960      I connected with the patient by video enabled telemedicine visit and verified that I am speaking with the correct person using two identifiers.    I discussed the limitations, risks, security and privacy concerns of performing an evaluation and management service by telemedicine and the availability of in-person appointments. I also discussed with the patient that there may be a patient responsible charge related to this service. The patient expressed understanding and agreed to proceed.  PATIENT AGREES AND CONFIRMS -YES   Other persons participating in the visit and their role in the encounter: Patient, nursing   Patient's location: Home Provider's location: Clinic   I discussed the limitations, risks, security and privacy concerns of performing an evaluation and management service by telephone and the availability of in person appointments. I also discussed with the patient that there may be a patient responsible charge related to this service. The patient expressed understanding and agreed to proceed.  This visit type was conducted due to national recommendations for restrictions regarding the COVID-19 Pandemic (e.g. social distancing).  This format is felt to be most appropriate for this patient at this time.  All issues noted in this document were discussed and addressed.        CONSULTATION DATE: 08/01/2018 REFERRING MD : Kyung RuddKennedy  CHIEF COMPLAINT: SOB  HISTORY OF PRESENT ILLNESS: 58 year old pleasant white female seen today for virtual visit Patient looks very ill appearing Patient is on chronic oxygen therapy for 1 year Patient has been diagnosed with COPD and emphysema Currently she takes Advair two 50-50 and Spiriva Respimat along with albuterol MDIs as needed as well as nebulizers as needed  At this time I do not think that the inhaler therapy is working at this time and I have explained to the patient that she  will need to switch to nebulized therapy  At this time she has 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 chronic hypoxic respiratory failure  At this time patient has severe shortness of breath at rest and with exertion She states she has wheezing at this time and some mucus production Findings suggest COPD exacerbation  Patient continues to smoke intermittently  I have explained to her that she is a candidate for lung cancer screening protocol   Smoking Assessment and Cessation Counseling   Upon further questioning, Patient smokes 1/2 ppd  I have advised patient to quit/stop smoking as soon as possible due to high risk for multiple medical problems  Patient is willing to quit smoking  I have advised patient that we can assist and have options of Nicotine replacement therapy. I also advised patient on behavioral therapy and can provide oral medication therapy in conjunction with the other therapies  Follow up next Office visit  for assessment of smoking cessation  Smoking cessation counseling advised for 4 minutes     PAST MEDICAL HISTORY :   has a past medical history of Allergy, Anxiety, Colitis, COPD (chronic obstructive pulmonary disease) (HCC), Depression, Graves disease, Hyperlipidemia, Hypertension, and Loss of eye, LEFT (2007).  has a past surgical history that includes Abdominal hysterectomy and Fracture surgery. Prior to Admission medications   Medication Sig Start Date End Date Taking? Authorizing Provider  albuterol (PROVENTIL HFA;VENTOLIN HFA) 108 (90 Base) MCG/ACT inhaler Inhale 2 puffs into the lungs every 6 (six) hours as needed for wheezing or shortness of breath. 04/19/18   Galen ManilaKennedy, Lauren Renee, NP  albuterol (PROVENTIL) (2.5 MG/3ML) 0.083%  nebulizer solution Take 3 mLs (2.5 mg total) by nebulization 4 (four) times daily. 02/01/18   Iloabachie, Chioma E, NP  amLODipine (NORVASC) 5 MG tablet TAKE 1 TABLET BY MOUTH DAILY 07/24/18   Althea CharonKaramalegos, Netta NeatAlexander J, DO  aspirin EC  81 MG tablet Take 81 mg by mouth daily.    [provider]  atorvastatin (LIPITOR) 20 MG tablet TAKE 1 TABLET BY MOUTH DAILY 07/24/18   Althea CharonKaramalegos, Netta NeatAlexander J, DO  cetirizine (ZYRTEC) 10 MG tablet Take 1 tablet (10 mg total) by mouth daily. 04/02/18   Galen ManilaKennedy, Lauren Renee, NP  cyclobenzaprine (FLEXERIL) 10 MG tablet Take 1 tablet (10 mg total) by mouth 2 (two) times daily as needed for muscle spasms (around lungs). 04/02/18   Galen ManilaKennedy, Lauren Renee, NP  Fluticasone-Salmeterol (ADVAIR) 250-50 MCG/DOSE AEPB Inhale 1 puff into the lungs 2 (two) times daily. 04/13/18   Galen ManilaKennedy, Lauren Renee, NP  guaiFENesin (MUCINEX) 600 MG 12 hr tablet Take 1,200 mg by mouth 2 (two) times daily as needed for cough or to loosen phlegm.    [provider]  hydrOXYzine (ATARAX/VISTARIL) 10 MG tablet Take 1-2 tablets (10-20 mg total) by mouth 3 (three) times daily as needed for anxiety (insomnia). 05/14/18   Galen ManilaKennedy, Lauren Renee, NP  Multiple Vitamin (MULTIVITAMIN WITH MINERALS) TABS tablet Take 1 tablet by mouth daily.    [provider]  sertraline (ZOLOFT) 100 MG tablet Take 1 tablet (100 mg total) by mouth daily. 05/14/18   Galen ManilaKennedy, Lauren Renee, NP  Tiotropium Bromide Monohydrate 2.5 MCG/ACT AERS Inhale 2 puffs into the lungs at bedtime. 02/01/18   Iloabachie, Chioma E, NP   Allergies  Allergen Reactions  . Codeine Hives and Nausea And Vomiting  . Shellfish Allergy Hives  . Penicillins Hives and Rash    FAMILY HISTORY:  family history includes Alcohol abuse in her brother and father; Celiac disease in her sister; Healthy in her sister; Heart disease in her father; Heart failure in her sister. SOCIAL HISTORY:  reports that she has been smoking cigarettes. She has been smoking about 0.10 packs per day. She has never used smokeless tobacco. She reports that she does not drink alcohol or use drugs.    Review of Systems:  Gen:  Denies  fever, sweats, chills weigh loss  HEENT: Denies blurred  vision, double vision, ear pain, eye pain, hearing loss, nose bleeds, sore throat Cardiac:  No dizziness, chest pain or heaviness, chest tightness,edema, No JVD Resp:  + cough, +sputum production, +shortness of breath,+wheezing, -hemoptysis,  Gi: Denies swallowing difficulty, stomach pain, nausea or vomiting, diarrhea, constipation, bowel incontinence Gu:  Denies bladder incontinence, burning urine Ext:   Denies Joint pain, stiffness or swelling Skin: Denies  skin rash, easy bruising or bleeding or hives Endoc:  Denies polyuria, polydipsia , polyphagia or weight change Psych:   Denies depression, insomnia or hallucinations  Other:  All other systems negative    Physical Examination:  Well nourished, well developed female in no acute distress. ill appearing, looks run down No Obvious Respiratory Distress noted EOMI intact, NO obvious oral lesions Facial Skin intact-no facial rash noted CN 3-12 intact   Insight, judgment intact. -depression -anxiety    MEDICATIONS: I have reviewed all medications and confirmed regimen as documented      ASSESSMENT AND PLAN SYNOPSIS  58 year old pleasant white female seen today for assessment for underlying COPD and emphysema in the setting of chronic hypoxic respiratory failure with ongoing tobacco abuse with gold stage D symptoms of  COPD in the setting of deconditioned state  This time patient does have evidence of COPD exacerbation   COPD exacerbation Prednisone 40 mg daily for 10 days Z-Pak  COPD end-stage Gold stage D He has very poor respiratory effort and respiratory insufficiency At this time patient is not able to take inhaler therapy and therefore will need to be switched to nebulized therapy I have ordered Pulmicort nebs twice a day I have ordered Perforomist nebs twice a day  Chronic hypoxic respiratory failure Continue oxygen as prescribed at 2.5 L nasal cannula Patient uses and benefits from oxygen therapy She needs this  to survive  Smoking cessation strongly advised   Lung cancer screening referral recommended`     COVID-19 EDUCATION: The signs and symptoms of COVID-19 were discussed with the patient and how to seek care for testing.  The importance of social distancing was discussed today. Hand Washing Techniques and avoid touching face was advised.  MEDICATION ADJUSTMENTS/LABS AND TESTS ORDERED: Stop Advair Stop Spiriva Start Pulmicort nebs Start Perforomist nebs Prednisone 40 mg daily for 10 days Z-Pak   CURRENT MEDICATIONS REVIEWED AT LENGTH WITH PATIENT TODAY   Patient satisfied with Plan of action and management. All questions answered  Follow up in 3 months  Total time spent 43 minutes   Corrin Parker, M.D.  [facility]  Medical Director [place] Director [facility] Cardio-Pulmonary Department

## 2018-08-02 ENCOUNTER — Telehealth: Payer: Self-pay | Admitting: *Deleted

## 2018-08-02 NOTE — Telephone Encounter (Signed)
Received referral for low dose lung cancer screening CT scan. Patient is contacted and discussed lung screening scan. Due to severe COPD patient would not pursue treatment if lung cancer were diagnosed. Due to this patient does not want to have lung screening scan at this time. She is aware that if anything changes or her health improves that she can contact me to facilitate lung screening at that time.

## 2018-08-03 ENCOUNTER — Telehealth: Payer: Self-pay | Admitting: Internal Medicine

## 2018-08-03 NOTE — Telephone Encounter (Signed)
Routing to Dr. Kasa as an FYI ?

## 2018-08-03 NOTE — Telephone Encounter (Signed)
Contacted [employer] at [phone] and started PA for perforomist 54mcg.  PA has been approved until 07/29/2019. [facility] has been made aware of approval.  Nothing further is needed.

## 2018-08-10 ENCOUNTER — Other Ambulatory Visit: Payer: Self-pay | Admitting: Nurse Practitioner

## 2018-08-10 DIAGNOSIS — J449 Chronic obstructive pulmonary disease, unspecified: Secondary | ICD-10-CM

## 2018-08-13 ENCOUNTER — Other Ambulatory Visit: Payer: Self-pay

## 2018-08-13 ENCOUNTER — Ambulatory Visit (INDEPENDENT_AMBULATORY_CARE_PROVIDER_SITE_OTHER): Payer: Medicaid Other | Admitting: Nurse Practitioner

## 2018-08-13 ENCOUNTER — Encounter: Payer: Self-pay | Admitting: Nurse Practitioner

## 2018-08-13 DIAGNOSIS — F419 Anxiety disorder, unspecified: Secondary | ICD-10-CM | POA: Diagnosis not present

## 2018-08-13 DIAGNOSIS — J449 Chronic obstructive pulmonary disease, unspecified: Secondary | ICD-10-CM | POA: Diagnosis not present

## 2018-08-13 DIAGNOSIS — F329 Major depressive disorder, single episode, unspecified: Secondary | ICD-10-CM

## 2018-08-13 DIAGNOSIS — F32A Depression, unspecified: Secondary | ICD-10-CM

## 2018-08-13 MED ORDER — SERTRALINE HCL 100 MG PO TABS: 100.0000 mg | ORAL_TABLET | Freq: Every day | ORAL | 1 refills | Status: DC

## 2018-08-13 MED ORDER — HYDROXYZINE HCL 10 MG PO TABS: 10.0000 mg | ORAL_TABLET | Freq: Three times a day (TID) | ORAL | 1 refills | Status: DC | PRN

## 2018-08-13 NOTE — Patient Instructions (Addendum)
Alicia Gibson,   Thank you for coming in to clinic today.  1. Morning fasting labs at next visit in 3 months.  Please schedule a follow-up appointment with Cassell Smiles, AGNP. Return in about 3 months (around 11/13/2018) for hypertension, cholesterol.  If you have any other questions or concerns, please feel free to call the clinic or send a message through [employer]. You may also schedule an earlier appointment if necessary.  You will receive a survey after today's visit either digitally by e-mail or paper by [employer]. Your experiences and feedback matter to [name].  Please respond so we know how we are doing as we provide care for you.   Cassell Smiles, DNP, AGNP-BC Adult Gerontology Nurse Practitioner [place]

## 2018-08-13 NOTE — Progress Notes (Signed)
Telemedicine Encounter: Disclosed to patient at start of encounter that we will provide appropriate telemedicine services.  Patient consents to be treated via phone prior to discussion. - Patient is at her home and is accessed via telephone. - Services are provided by Cassell Smiles from [facility].  Subjective:    Patient ID: Alicia Gibson, female    DOB: 1960/03/18, 58 y.o.   MRN: [id number]  Alicia Gibson is a 58 y.o. female presenting on 08/13/2018 for Anxiety and Depression  HPI Anxiety and Depression Patient is currently taking Hydroxyzine 10 mg 10-20 mg tid and sertraline 100 mg daily.  She has no reported side effects other than single episode of dizziness with fall.  Has had some lightheadedness about 7 weeks ago.  Lightheadedness resolved and no repeated incidents since  Patient reports a fall while in her bathroom without injury at that time.  Patient notes she is much less anxious and is functioning much better.  She is significantly less tearful and isn't anxious all the time on current regimen.  COPD Started prednisone with pulmonology.  Continues oxygen 24/7.  Patient has had DNR discussion. Is working on living will and [facility].  Declined lung cancer screening as she would not want to know, doesn't want to proceed with treatment if she has cancer.  Otherwise feels COPD is stable as managed by pulmonology.  More difficult to breathe with 90 degree days over last 2 weeks.  Generally stays indoors with air conditioning and has no significant respiratory distress.  GAD 7 : Generalized Anxiety Score 08/13/2018 05/14/2018  Nervous, Anxious, on Edge 1 2  Control/stop worrying 0 3  Worry too much - different things 1 3  Trouble relaxing 0 2  Restless 0 0  Easily annoyed or irritable 0 2  Afraid - awful might happen 0 3  Total GAD 7 Score 2 15  Anxiety Difficulty Not difficult at all Not difficult at all   Depression screen [facility] 2/9 08/13/2018 05/14/2018  Decreased  Interest 0 1  Down, Depressed, Hopeless 1 1  PHQ - 2 Score 1 2  Altered sleeping 0 2  Tired, decreased energy 1 3  Change in appetite 0 1  Feeling bad or failure about yourself  0 1  Trouble concentrating 0 0  Moving slowly or fidgety/restless 1 0  Suicidal thoughts 0 0  PHQ-9 Score 3 9  Difficult doing work/chores Not difficult at all Not difficult at all   Social History   Tobacco Use  . Smoking status: Former Smoker    Packs/day: 0.10    Types: Cigarettes  . Smokeless tobacco: Never Used  Substance Use Topics  . Alcohol use: Never    Frequency: Never  . Drug use: Never    Review of Systems Per HPI unless specifically indicated above     Objective:    There were no vitals taken for this visit.  Wt Readings from Last 3 Encounters:  04/02/18 115 lb 6.4 oz (52.3 kg)  03/14/18 116 lb 6.4 oz (52.8 kg)  02/22/18 113 lb (51.3 kg)    Physical Exam Patient remotely monitored.  Verbal communication appropriate.  Cognition normal.   Results for orders placed or performed in visit on 02/13/18  Lipid Profile  Result Value Ref Range   Cholesterol, Total 328 (H) 100 - 199 mg/dL   Triglycerides 161 (H) 0 - 149 mg/dL   HDL 74 >39 mg/dL   VLDL Cholesterol Cal 32 5 - 40 mg/dL  LDL Calculated 222 (H) 0 - 99 mg/dL   Comment: Comment    Chol/HDL Ratio 4.4 0.0 - 4.4 ratio      Assessment & Plan:   Problem List Items Addressed This Visit      Respiratory   Chronic bronchitis with COPD (chronic obstructive pulmonary disease) (HCC) Severe disease without exacerbation today via patient history.  Stable and managed by pulmonology (Dr. Belia HemanKasa) - see Epic. - Continue pulmonology management. - Continue plan to establish advance care directives.  Offered assistance through PCP office if needed. - Follow-up as planned with pulmonology.     Other   Anxiety and depression - Primary Improved on current therapies.  Stable at this time.  Continue medications without changes today.   Refills of hydroxyzine and sertraline provided. Follow-up 3 months.   Relevant Medications   hydrOXYzine (ATARAX/VISTARIL) 10 MG tablet   sertraline (ZOLOFT) 100 MG tablet      Meds ordered this encounter  Medications  . hydrOXYzine (ATARAX/VISTARIL) 10 MG tablet    Sig: Take 1-2 tablets (10-20 mg total) by mouth 3 (three) times daily as needed for anxiety (insomnia).    Dispense:  180 tablet    Refill:  1    Order Specific Question:   Supervising Provider    Answer:   Smitty CordsKARAMALEGOS, ALEXANDER J [[id number]]  . sertraline (ZOLOFT) 100 MG tablet    Sig: Take 1 tablet (100 mg total) by mouth daily.    Dispense:  90 tablet    Refill:  1    Order Specific Question:   Supervising Provider    Answer:   Smitty CordsKARAMALEGOS, ALEXANDER J [[id number]]   - Time spent in direct consultation with patient via telemedicine about above concerns: 14 minutes  Follow up plan: Return in about 3 months (around 11/13/2018) for hypertension, cholesterol.  Wilhelmina McardleLauren Nikyah Lackman, DNP, AGPCNP-BC Adult Gerontology Primary Care Nurse Practitioner [facility] 08/13/2018, 9:33 AM

## 2018-08-25 ENCOUNTER — Other Ambulatory Visit: Payer: Self-pay | Admitting: Nurse Practitioner

## 2018-08-25 DIAGNOSIS — J449 Chronic obstructive pulmonary disease, unspecified: Secondary | ICD-10-CM | POA: Diagnosis not present

## 2018-08-25 DIAGNOSIS — M62838 Other muscle spasm: Secondary | ICD-10-CM

## 2018-08-25 DIAGNOSIS — J439 Emphysema, unspecified: Secondary | ICD-10-CM | POA: Diagnosis not present

## 2018-09-25 DIAGNOSIS — J439 Emphysema, unspecified: Secondary | ICD-10-CM | POA: Diagnosis not present

## 2018-09-25 DIAGNOSIS — J449 Chronic obstructive pulmonary disease, unspecified: Secondary | ICD-10-CM | POA: Diagnosis not present

## 2018-09-30 ENCOUNTER — Other Ambulatory Visit: Payer: Self-pay | Admitting: Family Medicine

## 2018-09-30 DIAGNOSIS — J449 Chronic obstructive pulmonary disease, unspecified: Secondary | ICD-10-CM

## 2018-10-23 ENCOUNTER — Telehealth: Payer: Self-pay | Admitting: Internal Medicine

## 2018-10-23 MED ORDER — PREDNISONE 20 MG PO TABS: 40.0000 mg | ORAL_TABLET | Freq: Every day | ORAL | 0 refills | Status: DC

## 2018-10-23 MED ORDER — AZITHROMYCIN 250 MG PO TABS: ORAL_TABLET | ORAL | 0 refills | Status: AC

## 2018-10-23 NOTE — Telephone Encounter (Signed)
Left message for pt to relay recommendations.  

## 2018-10-23 NOTE — Telephone Encounter (Signed)
Called and spoke to pt.  Pt reports of increased sob, prod cough with clear mucus, wheezing, nasal drainage & occ chills x2 weeks.  Pt wearing 2L cont. Pt unable to check spO2, as she does not have a pulse ox.  Using albuterol neb 1-2x daily and HFA 3-4x daily. During our conservation pt was working to breath and was unable to speak a complete sentence without stopping for a breath.  I have recommended ED due to sx. Pt declined ED and would like recommendations.  DK please advise. Thanks

## 2018-10-23 NOTE — Telephone Encounter (Signed)
Rx for prednisone and zpak has been sent to preferred pharmacy. Pt is aware and voiced her understanding. Nothing further is needed.  

## 2018-10-23 NOTE — Telephone Encounter (Signed)
Please verify days of prednisone?

## 2018-10-23 NOTE — Telephone Encounter (Signed)
I STRONGLY RECOMMEND ED ASSESSMENT  PREDNISONE 40 Mg daily and Z pak for now All other therapy seems to be maximized

## 2018-10-23 NOTE — Telephone Encounter (Signed)
10 days

## 2018-10-25 DIAGNOSIS — J439 Emphysema, unspecified: Secondary | ICD-10-CM | POA: Diagnosis not present

## 2018-10-25 DIAGNOSIS — J449 Chronic obstructive pulmonary disease, unspecified: Secondary | ICD-10-CM | POA: Diagnosis not present

## 2018-11-09 ENCOUNTER — Other Ambulatory Visit: Payer: Self-pay | Admitting: Nurse Practitioner

## 2018-11-09 ENCOUNTER — Other Ambulatory Visit: Payer: Self-pay

## 2018-11-09 ENCOUNTER — Ambulatory Visit: Payer: Medicaid Other

## 2018-11-09 DIAGNOSIS — I10 Essential (primary) hypertension: Secondary | ICD-10-CM | POA: Diagnosis not present

## 2018-11-09 DIAGNOSIS — D582 Other hemoglobinopathies: Secondary | ICD-10-CM

## 2018-11-09 DIAGNOSIS — E785 Hyperlipidemia, unspecified: Secondary | ICD-10-CM | POA: Diagnosis not present

## 2018-11-10 ENCOUNTER — Other Ambulatory Visit: Payer: Self-pay | Admitting: Nurse Practitioner

## 2018-11-10 DIAGNOSIS — J301 Allergic rhinitis due to pollen: Secondary | ICD-10-CM

## 2018-11-10 LAB — COMPREHENSIVE METABOLIC PANEL
AG Ratio: 2 (calc) (ref 1.0–2.5)
ALT: 16 U/L (ref 6–29)
AST: 18 U/L (ref 10–35)
Albumin: 4.3 g/dL (ref 3.6–5.1)
Alkaline phosphatase (APISO): 69 U/L (ref 37–153)
BUN: 19 mg/dL (ref 7–25)
CO2: 33 mmol/L — ABNORMAL HIGH (ref 20–32)
Calcium: 9.1 mg/dL (ref 8.6–10.4)
Chloride: 99 mmol/L (ref 98–110)
Creat: 0.69 mg/dL (ref 0.50–1.05)
Globulin: 2.1 g/dL (calc) (ref 1.9–3.7)
Glucose, Bld: 97 mg/dL (ref 65–99)
Potassium: 4.2 mmol/L (ref 3.5–5.3)
Sodium: 141 mmol/L (ref 135–146)
Total Bilirubin: 0.3 mg/dL (ref 0.2–1.2)
Total Protein: 6.4 g/dL (ref 6.1–8.1)

## 2018-11-10 LAB — CBC WITH DIFFERENTIAL/PLATELET
Absolute Monocytes: 596 cells/uL (ref 200–950)
Basophils Absolute: 50 cells/uL (ref 0–200)
Basophils Relative: 0.6 %
Eosinophils Absolute: 571 cells/uL — ABNORMAL HIGH (ref 15–500)
Eosinophils Relative: 6.8 %
HCT: 40.3 % (ref 35.0–45.0)
Hemoglobin: 13.3 g/dL (ref 11.7–15.5)
Lymphs Abs: 2176 cells/uL (ref 850–3900)
MCH: 29.5 pg (ref 27.0–33.0)
MCHC: 33 g/dL (ref 32.0–36.0)
MCV: 89.4 fL (ref 80.0–100.0)
MPV: 9.9 fL (ref 7.5–12.5)
Monocytes Relative: 7.1 %
Neutro Abs: 5006 cells/uL (ref 1500–7800)
Neutrophils Relative %: 59.6 %
Platelets: 252 10*3/uL (ref 140–400)
RBC: 4.51 10*6/uL (ref 3.80–5.10)
RDW: 13.2 % (ref 11.0–15.0)
Total Lymphocyte: 25.9 %
WBC: 8.4 10*3/uL (ref 3.8–10.8)

## 2018-11-10 LAB — LIPID PANEL
Cholesterol: 235 mg/dL — ABNORMAL HIGH (ref <200)
HDL: 82 mg/dL (ref >=50)
LDL Cholesterol (Calc): 131 mg/dL (calc) — ABNORMAL HIGH
Non-HDL Cholesterol (Calc): 153 mg/dL (calc) — ABNORMAL HIGH (ref <130)
Total CHOL/HDL Ratio: 2.9 (calc) (ref <5.0)
Triglycerides: 112 mg/dL (ref <150)

## 2018-11-12 ENCOUNTER — Other Ambulatory Visit: Payer: Self-pay | Admitting: Nurse Practitioner

## 2018-11-12 DIAGNOSIS — E785 Hyperlipidemia, unspecified: Secondary | ICD-10-CM

## 2018-11-12 MED ORDER — ATORVASTATIN CALCIUM 40 MG PO TABS: 40.0000 mg | ORAL_TABLET | Freq: Every day | ORAL | 1 refills | Status: DC

## 2018-11-13 ENCOUNTER — Other Ambulatory Visit: Payer: Self-pay | Admitting: Nurse Practitioner

## 2018-11-13 ENCOUNTER — Ambulatory Visit (INDEPENDENT_AMBULATORY_CARE_PROVIDER_SITE_OTHER): Payer: Medicaid Other | Admitting: Nurse Practitioner

## 2018-11-13 ENCOUNTER — Encounter: Payer: Self-pay | Admitting: Nurse Practitioner

## 2018-11-13 ENCOUNTER — Other Ambulatory Visit: Payer: Self-pay

## 2018-11-13 DIAGNOSIS — F329 Major depressive disorder, single episode, unspecified: Secondary | ICD-10-CM

## 2018-11-13 DIAGNOSIS — M62838 Other muscle spasm: Secondary | ICD-10-CM | POA: Diagnosis not present

## 2018-11-13 DIAGNOSIS — F419 Anxiety disorder, unspecified: Secondary | ICD-10-CM

## 2018-11-13 DIAGNOSIS — J449 Chronic obstructive pulmonary disease, unspecified: Secondary | ICD-10-CM | POA: Diagnosis not present

## 2018-11-13 DIAGNOSIS — E782 Mixed hyperlipidemia: Secondary | ICD-10-CM | POA: Diagnosis not present

## 2018-11-13 DIAGNOSIS — F32A Depression, unspecified: Secondary | ICD-10-CM

## 2018-11-13 DIAGNOSIS — J4489 Other specified chronic obstructive pulmonary disease: Secondary | ICD-10-CM

## 2018-11-13 DIAGNOSIS — I10 Essential (primary) hypertension: Secondary | ICD-10-CM | POA: Diagnosis not present

## 2018-11-13 MED ORDER — SERTRALINE HCL 100 MG PO TABS: 100.0000 mg | ORAL_TABLET | Freq: Every day | ORAL | 1 refills | Status: DC

## 2018-11-13 MED ORDER — CYCLOBENZAPRINE HCL 10 MG PO TABS: ORAL_TABLET | ORAL | 5 refills | Status: DC

## 2018-11-13 MED ORDER — GUAIFENESIN ER 600 MG PO TB12: 1200.0000 mg | ORAL_TABLET | Freq: Two times a day (BID) | ORAL | 2 refills | Status: DC | PRN

## 2018-11-13 MED ORDER — ALBUTEROL SULFATE (2.5 MG/3ML) 0.083% IN NEBU: 2.5000 mg | INHALATION_SOLUTION | Freq: Four times a day (QID) | RESPIRATORY_TRACT | 3 refills | Status: AC

## 2018-11-13 MED ORDER — HYDROXYZINE HCL 10 MG PO TABS: 10.0000 mg | ORAL_TABLET | Freq: Three times a day (TID) | ORAL | 1 refills | Status: DC | PRN

## 2018-11-13 MED ORDER — AMLODIPINE BESYLATE 5 MG PO TABS: 5.0000 mg | ORAL_TABLET | Freq: Every day | ORAL | 1 refills | Status: DC

## 2018-11-13 MED ORDER — ALBUTEROL SULFATE (2.5 MG/3ML) 0.083% IN NEBU: 2.5000 mg | INHALATION_SOLUTION | Freq: Four times a day (QID) | RESPIRATORY_TRACT | 3 refills | Status: DC

## 2018-11-13 NOTE — Progress Notes (Signed)
Telemedicine Encounter: Disclosed to patient at start of encounter that we will provide appropriate telemedicine services.  Patient consents to be treated via phone prior to discussion. - Patient is at her home and is accessed via telephone. - Services are provided by Wilhelmina Mcardle from [facility].  Subjective:    Patient ID: Alicia Gibson, female    DOB: [birth year], 58 y.o.   MRN: [id number]  Alicia Gibson is a 58 y.o. female presenting on 11/13/2018 for Depression (ANXIETY)  HPI Depression/Anxiety Visit on 08/13/2018, patient had improved symptoms with medication adjustment.  Patient states moods continue to be stable today.  Generally in good spirits.  Patient is struggling some with her breathing today and this always causes her to feel a little more down.  Tolerating her medications well.  Takes hydroxyzine prn anxiety, sertraline 100 mg daily.  Blood pressure: Patient reported readings 110-120s SBP.  Feels good in general.   Pt denies headache, lightheadedness, dizziness, changes in vision, chest tightness/pressure, palpitations, leg swelling, sudden loss of speech or loss of consciousness.   Cholesterol: Patient has occasional nighttime muscle cramps, but is chronic.  Continues on atorvastatin 20 mg, increased to 40 mg yesterday after labs.  - Pt denies changes in vision, chest tightness/pressure, palpitations, leg pain while walking, leg or arm weakness, and sudden loss of speech or loss of consciousness.   COPD: Patient has had worsening breathing/wheezing over last several weeks.  Patient has spoken with [name] for sick call. Patient has completed 10 days of prednisone and Zpak.  This helped "a little bit."  Patient is only a little better since that time, but states she is about the same. - Patient is taking pulmicort and Perforomist nebulizers - Stopped Spiriva.  Continues albuterol inhaler.    GAD 7 : Generalized Anxiety Score 08/13/2018 05/14/2018   Nervous, Anxious, on Edge 1 2  Control/stop worrying 0 3  Worry too much - different things 1 3  Trouble relaxing 0 2  Restless 0 0  Easily annoyed or irritable 0 2  Afraid - awful might happen 0 3  Total GAD 7 Score 2 15  Anxiety Difficulty Not difficult at all Not difficult at all   Depression screen [facility] 2/9 11/13/2018 08/13/2018 05/14/2018  Decreased Interest 0 0 1  Down, Depressed, Hopeless 0 1 1  PHQ - 2 Score 0 1 2  Altered sleeping 1 0 2  Tired, decreased energy 1 1 3   Change in appetite 0 0 1  Feeling bad or failure about yourself  0 0 1  Trouble concentrating 0 0 0  Moving slowly or fidgety/restless 0 1 0  Suicidal thoughts 0 0 0  PHQ-9 Score 2 3 9   Difficult doing work/chores Not difficult at all Not difficult at all Not difficult at all     Social History   Tobacco Use  . Smoking status: Former Smoker    Packs/day: 0.10    Types: Cigarettes  . Smokeless tobacco: Never Used  Substance Use Topics  . Alcohol use: Never    Frequency: Never  . Drug use: Never    Review of Systems Per HPI unless specifically indicated above     Objective:    There were no vitals taken for this visit.  Wt Readings from Last 3 Encounters:  04/02/18 115 lb 6.4 oz (52.3 kg)  03/14/18 116 lb 6.4 oz (52.8 kg)  02/22/18 113 lb (51.3 kg)    Physical Exam Patient remotely monitored.  Verbal  communication appropriate.  Cognition normal.  Results for orders placed or performed in visit on 11/09/18  CBC with Differential/Platelet  Result Value Ref Range   WBC 8.4 3.8 - 10.8 Thousand/uL   RBC 4.51 3.80 - 5.10 Million/uL   Hemoglobin 13.3 11.7 - 15.5 g/dL   HCT 40.3 35.0 - 45.0 %   MCV 89.4 80.0 - 100.0 fL   MCH 29.5 27.0 - 33.0 pg   MCHC 33.0 32.0 - 36.0 g/dL   RDW 13.2 11.0 - 15.0 %   Platelets 252 140 - 400 Thousand/uL   MPV 9.9 7.5 - 12.5 fL   Neutro Abs 5,006 1,500 - 7,800 cells/uL   Lymphs Abs 2,176 850 - 3,900 cells/uL   Absolute Monocytes 596 200 - 950 cells/uL    Eosinophils Absolute 571 (H) 15 - 500 cells/uL   Basophils Absolute 50 0 - 200 cells/uL   Neutrophils Relative % 59.6 %   Total Lymphocyte 25.9 %   Monocytes Relative 7.1 %   Eosinophils Relative 6.8 %   Basophils Relative 0.6 %  Lipid panel  Result Value Ref Range   Cholesterol 235 (H) <200 mg/dL   HDL 82 > OR = 50 mg/dL   Triglycerides 112 <150 mg/dL   LDL Cholesterol (Calc) 131 (H) mg/dL (calc)   Total CHOL/HDL Ratio 2.9 <5.0 (calc)   Non-HDL Cholesterol (Calc) 153 (H) <130 mg/dL (calc)  Comprehensive metabolic panel  Result Value Ref Range   Glucose, Bld 97 65 - 99 mg/dL   BUN 19 7 - 25 mg/dL   Creat 0.69 0.50 - 1.05 mg/dL   BUN/Creatinine Ratio NOT APPLICABLE 6 - 22 (calc)   Sodium 141 135 - 146 mmol/L   Potassium 4.2 3.5 - 5.3 mmol/L   Chloride 99 98 - 110 mmol/L   CO2 33 (H) 20 - 32 mmol/L   Calcium 9.1 8.6 - 10.4 mg/dL   Total Protein 6.4 6.1 - 8.1 g/dL   Albumin 4.3 3.6 - 5.1 g/dL   Globulin 2.1 1.9 - 3.7 g/dL (calc)   AG Ratio 2.0 1.0 - 2.5 (calc)   Total Bilirubin 0.3 0.2 - 1.2 mg/dL   Alkaline phosphatase (APISO) 69 37 - 153 U/L   AST 18 10 - 35 U/L   ALT 16 6 - 29 U/L      Assessment & Plan:   Problem List Items Addressed This Visit      Cardiovascular and Mediastinum   Essential hypertension Controlled hypertension.  BP goal < 130/80.  Pt is continuing lifestyle modifications.  Taking medications tolerating well without side effects. No current complications.  Plan: 1. Continue taking  2. Reviewed labs from this week  3. Encouraged heart healthy diet and increasing exercise to 30 minutes most days of the week. 4. Check BP 1-2 x per week at home, keep log, and bring to clinic at next appointment. 5. Follow up 3 months.       Respiratory   Chronic bronchitis with COPD (chronic obstructive pulmonary disease) (HCC) - Primary Uncontrolled, with exacerbation, severe GOLD D with symptoms today.  Not improving after last sick care provided by Dr. Mortimer Fries.  Reviewed last note.  Stop all inhalers except albuterol.  Convert to regular nebs.   - Reviewed dosing with patient - Should consider using albuterol nebulizer prior to regular medications for next 3-5 days.  Then wait 5 minutes before using Perforomist and Pulmicort.   - Discussed hospice vs palliative care.  Patient would like to think about  this.  I would highly recommend starting these consultations/evaluations for admission (at minimum to palliative services).  Patient to call back, appreciative of the information. - FOLLOW-UP with Dr. Belia HemanKasa for any additional needs for current exacerbation     Other   Anxiety and depression Stable today on exam.  Medications tolerated without side effects.  Continue at current doses.  Refills provided.  Encouraged ongoing support system, coping skills. Followup 3 months.    Mixed Hyperlipidemia  Lipids are improving, but not yet at goal.  Increase medication as instructed for atorvastatin 40 mg daily.  Patient verbalizes understanding.  FOLLOW-UP 3 months.      Meds ordered this encounter  Medications  . sertraline (ZOLOFT) 100 MG tablet    Sig: Take 1 tablet (100 mg total) by mouth daily.    Dispense:  90 tablet    Refill:  1    Order Specific Question:   Supervising Provider    Answer:   Smitty CordsKARAMALEGOS, ALEXANDER J [[id number]]  . hydrOXYzine (ATARAX/VISTARIL) 10 MG tablet    Sig: Take 1-2 tablets (10-20 mg total) by mouth 3 (three) times daily as needed for anxiety (insomnia).    Dispense:  180 tablet    Refill:  1    Order Specific Question:   Supervising Provider    Answer:   Smitty CordsKARAMALEGOS, ALEXANDER J [[id number]]  . guaiFENesin (MUCINEX) 600 MG 12 hr tablet    Sig: Take 2 tablets (1,200 mg total) by mouth 2 (two) times daily as needed for cough or to loosen phlegm.    Dispense:  120 tablet    Refill:  2    Order Specific Question:   Supervising Provider    Answer:   Smitty CordsKARAMALEGOS, ALEXANDER J [[id number]]  . amLODipine (NORVASC) 5 MG tablet    Sig: Take 1  tablet (5 mg total) by mouth daily.    Dispense:  90 tablet    Refill:  1    Order Specific Question:   Supervising Provider    Answer:   Smitty CordsKARAMALEGOS, ALEXANDER J [[id number]]  . cyclobenzaprine (FLEXERIL) 10 MG tablet    Sig: TAKE 1 TABLET BY MOUTH 2 TIMES DAILY AS NEEDED FOR MUSCLE SPASMS    Dispense:  60 tablet    Refill:  5    Order Specific Question:   Supervising Provider    Answer:   Smitty CordsKARAMALEGOS, ALEXANDER J [[id number]]  . albuterol (PROVENTIL) (2.5 MG/3ML) 0.083% nebulizer solution    Sig: Take 3 mLs (2.5 mg total) by nebulization 4 (four) times daily.    Dispense:  75 mL    Refill:  3    Order Specific Question:   Supervising Provider    Answer:   Smitty CordsKARAMALEGOS, ALEXANDER J [[id number]]   - Time spent in direct consultation with patient via telemedicine about above concerns: 12 minutes  Follow up plan: Follow-up 3 months hypertension, copd, anxiety and depression, lipid  Wilhelmina McardleLauren Talyah Seder, DNP, AGPCNP-BC Adult Gerontology Primary Care Nurse Practitioner [facility] 11/13/2018, 8:16 AM

## 2018-11-15 ENCOUNTER — Telehealth: Payer: Self-pay

## 2018-11-15 DIAGNOSIS — J449 Chronic obstructive pulmonary disease, unspecified: Secondary | ICD-10-CM

## 2018-11-15 MED ORDER — AZITHROMYCIN 250 MG PO TABS: ORAL_TABLET | ORAL | 0 refills | Status: AC

## 2018-11-15 MED ORDER — PREDNISONE 20 MG PO TABS: 40.0000 mg | ORAL_TABLET | Freq: Every day | ORAL | 0 refills | Status: DC

## 2018-11-15 NOTE — Telephone Encounter (Signed)
Needs to go to ER She needs IV meds and assessment

## 2018-11-15 NOTE — Telephone Encounter (Signed)
Pt is aware of recommendations and voiced her understanding.  Nothing further is needed.  

## 2018-11-15 NOTE — Telephone Encounter (Signed)
-----   Message from Flora Lipps, MD sent at 11/14/2018  5:35 PM EDT ----- Regarding: follow up call Hi Margie,  Can you call and see how this patient is doing? Her PCP messages me and states not feeling well  Can prescribe Z pak and Prednisone 40 mg daily for 10 days if needed

## 2018-11-15 NOTE — Telephone Encounter (Signed)
Pt is returning call - please call back CB# [phone]

## 2018-11-15 NOTE — Telephone Encounter (Signed)
Left message for pt

## 2018-11-15 NOTE — Telephone Encounter (Signed)
I offered a referral and placed this for her.  Someone will call.  She can have a consult with them without deciding to receiving services if that is what she eventually decides.  It is a no pressure referral.

## 2018-11-15 NOTE — Telephone Encounter (Signed)
The pt called requesting Hospice/ Palliative care information to be mailed out to here.

## 2018-11-15 NOTE — Telephone Encounter (Signed)
Spoke to pt, who stated that symptoms have not improved.  Pt reports of increased sob, dry cough at time prod with clear mucus, nasal drainage clear in color and wheezing. Denied f/c/s.  Using albuterol HFA 4x times daily and albuterol nebulizer solution 2 times daily with no relief.  Rx for prednisone and zpak has been sent to preferred pharmacy, as instructed below by Dr. Mortimer Fries.   Will route to Dr. Mortimer Fries as an Juluis Rainier.

## 2018-11-15 NOTE — Telephone Encounter (Signed)
The pt was notified and she verbalize understanding. No question or concerns.

## 2018-11-24 ENCOUNTER — Other Ambulatory Visit: Payer: Self-pay | Admitting: Family Medicine

## 2018-11-24 DIAGNOSIS — I10 Essential (primary) hypertension: Secondary | ICD-10-CM

## 2018-11-25 DIAGNOSIS — J449 Chronic obstructive pulmonary disease, unspecified: Secondary | ICD-10-CM | POA: Diagnosis not present

## 2018-11-25 DIAGNOSIS — J439 Emphysema, unspecified: Secondary | ICD-10-CM | POA: Diagnosis not present

## 2018-12-02 ENCOUNTER — Other Ambulatory Visit: Payer: Self-pay | Admitting: Family Medicine

## 2018-12-02 DIAGNOSIS — E785 Hyperlipidemia, unspecified: Secondary | ICD-10-CM

## 2018-12-07 ENCOUNTER — Telehealth: Payer: Self-pay | Admitting: Pharmacy Technician

## 2018-12-07 NOTE — Telephone Encounter (Signed)
Patient has prescription drug coverage with Medicaid.  No longer meets [facility]'s eligibility.  [facility]

## 2018-12-25 DIAGNOSIS — J449 Chronic obstructive pulmonary disease, unspecified: Secondary | ICD-10-CM | POA: Diagnosis not present

## 2018-12-25 DIAGNOSIS — J439 Emphysema, unspecified: Secondary | ICD-10-CM | POA: Diagnosis not present

## 2018-12-28 ENCOUNTER — Other Ambulatory Visit: Payer: Self-pay | Admitting: Family Medicine

## 2018-12-28 DIAGNOSIS — E785 Hyperlipidemia, unspecified: Secondary | ICD-10-CM

## 2019-01-25 DIAGNOSIS — J449 Chronic obstructive pulmonary disease, unspecified: Secondary | ICD-10-CM | POA: Diagnosis not present

## 2019-01-25 DIAGNOSIS — J439 Emphysema, unspecified: Secondary | ICD-10-CM | POA: Diagnosis not present

## 2019-02-11 ENCOUNTER — Other Ambulatory Visit: Payer: Self-pay | Admitting: Nurse Practitioner

## 2019-02-11 DIAGNOSIS — J449 Chronic obstructive pulmonary disease, unspecified: Secondary | ICD-10-CM

## 2019-02-13 ENCOUNTER — Other Ambulatory Visit: Payer: Self-pay | Admitting: Nurse Practitioner

## 2019-02-13 DIAGNOSIS — F329 Major depressive disorder, single episode, unspecified: Secondary | ICD-10-CM

## 2019-02-13 DIAGNOSIS — F32A Depression, unspecified: Secondary | ICD-10-CM

## 2019-02-25 DIAGNOSIS — J449 Chronic obstructive pulmonary disease, unspecified: Secondary | ICD-10-CM | POA: Diagnosis not present

## 2019-02-25 DIAGNOSIS — J439 Emphysema, unspecified: Secondary | ICD-10-CM | POA: Diagnosis not present

## 2019-03-21 ENCOUNTER — Other Ambulatory Visit: Payer: Self-pay

## 2019-03-21 DIAGNOSIS — E785 Hyperlipidemia, unspecified: Secondary | ICD-10-CM

## 2019-03-21 MED ORDER — ATORVASTATIN CALCIUM 40 MG PO TABS: 40.0000 mg | ORAL_TABLET | Freq: Every day | ORAL | 0 refills | Status: DC

## 2019-03-25 DIAGNOSIS — J439 Emphysema, unspecified: Secondary | ICD-10-CM | POA: Diagnosis not present

## 2019-03-25 DIAGNOSIS — J449 Chronic obstructive pulmonary disease, unspecified: Secondary | ICD-10-CM | POA: Diagnosis not present

## 2019-04-25 DIAGNOSIS — J439 Emphysema, unspecified: Secondary | ICD-10-CM | POA: Diagnosis not present

## 2019-04-25 DIAGNOSIS — J449 Chronic obstructive pulmonary disease, unspecified: Secondary | ICD-10-CM | POA: Diagnosis not present

## 2019-05-18 ENCOUNTER — Other Ambulatory Visit: Payer: Self-pay | Admitting: Family Medicine

## 2019-05-18 DIAGNOSIS — J449 Chronic obstructive pulmonary disease, unspecified: Secondary | ICD-10-CM

## 2019-05-18 NOTE — Telephone Encounter (Signed)
Requested Prescriptions  Pending Prescriptions Disp Refills  . albuterol (VENTOLIN HFA) 108 (90 Base) MCG/ACT inhaler [Pharmacy Med Name: ALBUTEROL HFA INH (200 PUFFS)8.5GM] 25.5 g 2    Sig: INHALE 2 PUFFS INTO THE LUNGS EVERY 6 HOURS AS NEEDED FOR WHEEZING OR SHORTNESS OF BREATH     Pulmonology:  Beta Agonists Failed - 05/18/2019 12:23 PM      Failed - One inhaler should last at least one month. If the patient is requesting refills earlier, contact the patient to check for uncontrolled symptoms.      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

## 2019-05-25 DIAGNOSIS — J449 Chronic obstructive pulmonary disease, unspecified: Secondary | ICD-10-CM | POA: Diagnosis not present

## 2019-05-25 DIAGNOSIS — J439 Emphysema, unspecified: Secondary | ICD-10-CM | POA: Diagnosis not present

## 2019-05-27 ENCOUNTER — Other Ambulatory Visit: Payer: Self-pay

## 2019-05-27 ENCOUNTER — Encounter: Payer: Self-pay | Admitting: Family Medicine

## 2019-05-27 ENCOUNTER — Other Ambulatory Visit: Payer: Self-pay | Admitting: Family Medicine

## 2019-05-27 ENCOUNTER — Telehealth (INDEPENDENT_AMBULATORY_CARE_PROVIDER_SITE_OTHER): Payer: Medicaid Other | Admitting: Family Medicine

## 2019-05-27 DIAGNOSIS — J441 Chronic obstructive pulmonary disease with (acute) exacerbation: Secondary | ICD-10-CM | POA: Diagnosis not present

## 2019-05-27 DIAGNOSIS — J449 Chronic obstructive pulmonary disease, unspecified: Secondary | ICD-10-CM | POA: Diagnosis not present

## 2019-05-27 DIAGNOSIS — M62838 Other muscle spasm: Secondary | ICD-10-CM

## 2019-05-27 DIAGNOSIS — J301 Allergic rhinitis due to pollen: Secondary | ICD-10-CM

## 2019-05-27 DIAGNOSIS — K219 Gastro-esophageal reflux disease without esophagitis: Secondary | ICD-10-CM

## 2019-05-27 DIAGNOSIS — I10 Essential (primary) hypertension: Secondary | ICD-10-CM | POA: Diagnosis not present

## 2019-05-27 DIAGNOSIS — E785 Hyperlipidemia, unspecified: Secondary | ICD-10-CM

## 2019-05-27 DIAGNOSIS — F32A Depression, unspecified: Secondary | ICD-10-CM

## 2019-05-27 DIAGNOSIS — F419 Anxiety disorder, unspecified: Secondary | ICD-10-CM | POA: Diagnosis not present

## 2019-05-27 DIAGNOSIS — R062 Wheezing: Secondary | ICD-10-CM

## 2019-05-27 DIAGNOSIS — F329 Major depressive disorder, single episode, unspecified: Secondary | ICD-10-CM

## 2019-05-27 MED ORDER — MUCINEX 600 MG PO TB12: 1200.0000 mg | ORAL_TABLET | Freq: Two times a day (BID) | ORAL | 2 refills | Status: AC | PRN

## 2019-05-27 MED ORDER — HYDROXYZINE HCL 10 MG PO TABS: ORAL_TABLET | ORAL | 1 refills | Status: DC

## 2019-05-27 MED ORDER — ATORVASTATIN CALCIUM 40 MG PO TABS: 40.0000 mg | ORAL_TABLET | Freq: Every day | ORAL | 0 refills | Status: DC

## 2019-05-27 MED ORDER — OMEPRAZOLE MAGNESIUM 20 MG PO TBEC: 20.0000 mg | DELAYED_RELEASE_TABLET | Freq: Every day | ORAL | 1 refills | Status: DC

## 2019-05-27 MED ORDER — CYCLOBENZAPRINE HCL 10 MG PO TABS: ORAL_TABLET | ORAL | 5 refills | Status: DC

## 2019-05-27 MED ORDER — BUDESONIDE 0.5 MG/2ML IN SUSP: 0.5000 mg | Freq: Two times a day (BID) | RESPIRATORY_TRACT | 10 refills | Status: AC

## 2019-05-27 MED ORDER — SERTRALINE HCL 100 MG PO TABS: 100.0000 mg | ORAL_TABLET | Freq: Every day | ORAL | 1 refills | Status: DC

## 2019-05-27 MED ORDER — CETIRIZINE HCL 10 MG PO TABS: 10.0000 mg | ORAL_TABLET | Freq: Every day | ORAL | 5 refills | Status: DC

## 2019-05-27 MED ORDER — AMLODIPINE BESYLATE 5 MG PO TABS: 5.0000 mg | ORAL_TABLET | Freq: Every day | ORAL | 2 refills | Status: DC

## 2019-05-27 NOTE — Progress Notes (Signed)
Virtual Visit via Telephone  The purpose of this virtual visit is to provide medical care while limiting exposure to the novel coronavirus (COVID19) for both patient and office staff.  Consent was obtained for phone visit:  Yes.   Answered questions that patient had about telehealth interaction:  Yes.   I discussed the limitations, risks, security and privacy concerns of performing an evaluation and management service by telephone. I also discussed with the patient that there may be a patient responsible charge related to this service. The patient expressed understanding and agreed to proceed.  Patient is at home and is accessed via telephone Services are provided by Charlaine Dalton, FNP-C from [facility])  ---------------------------------------------------------------------- Chief Complaint  Patient presents with  . Anxiety    pt aware that she is in her End stage of her COPD, but declined Hospice Care at this time. She declined testing to find out if she had lung cancer, because she state that she just doesn't want to know.   . Hypertension    S: Reviewed CMA documentation. I have called patient and gathered additional HPI as follows:  Ms. Shannon presents for telemedicine visit for medication refills.  Reports that she is needing refills on most of her medications, has not checked her blood pressure, has increased her oxygen from 2.5L to 3L with some subjective improvement in her COPD.  Denies any headaches, SOB, visual changes, dizziness/lightheadedness, CP, numbness, tingling, weakness, abdominal pain, n/v/d.  Patient is currently home Denies any high risk travel to areas of current concern for COVID19. Denies any known or suspected exposure to person with or possibly with COVID19.   Past Medical History:  Diagnosis Date  . Allergy   . Anxiety   . Colitis    recurrent  . COPD (chronic obstructive pulmonary disease) (HCC)   . Depression   . Graves  disease    s/p iodine radiation treatment - euthyroid for many years per pt  . Hyperlipidemia   . Hypertension   . Loss of eye, LEFT 2007   Social History   Tobacco Use  . Smoking status: Former Smoker    Packs/day: 0.10    Types: Cigarettes  . Smokeless tobacco: Never Used  Substance Use Topics  . Alcohol use: Never  . Drug use: Never    Current Outpatient Medications:  .  albuterol (PROVENTIL) (2.5 MG/3ML) 0.083% nebulizer solution, Take 3 mLs (2.5 mg total) by nebulization 4 (four) times daily., Disp: 75 mL, Rfl: 3 .  albuterol (VENTOLIN HFA) 108 (90 Base) MCG/ACT inhaler, INHALE 2 PUFFS INTO THE LUNGS EVERY 6 HOURS AS NEEDED FOR WHEEZING OR SHORTNESS OF BREATH, Disp: 25.5 g, Rfl: 0 .  amLODipine (NORVASC) 5 MG tablet, Take 1 tablet (5 mg total) by mouth daily., Disp: 30 tablet, Rfl: 2 .  aspirin EC 81 MG tablet, Take 81 mg by mouth daily., Disp: , Rfl:  .  atorvastatin (LIPITOR) 40 MG tablet, Take 1 tablet (40 mg total) by mouth daily., Disp: 30 tablet, Rfl: 0 .  budesonide (PULMICORT) 0.5 MG/2ML nebulizer solution, Take 2 mLs (0.5 mg total) by nebulization 2 (two) times daily., Disp: 1440 mL, Rfl: 10 .  cetirizine (ZYRTEC) 10 MG tablet, Take 1 tablet (10 mg total) by mouth daily., Disp: 30 tablet, Rfl: 5 .  cyclobenzaprine (FLEXERIL) 10 MG tablet, TAKE 1 TABLET BY MOUTH 2 TIMES DAILY AS NEEDED FOR MUSCLE SPASMS, Disp: 60 tablet, Rfl: 5 .  formoterol (PERFOROMIST) 20 MCG/2ML nebulizer  solution, Take 2 mLs (20 mcg total) by nebulization 2 (two) times daily., Disp: 1440 mL, Rfl: 10 .  guaiFENesin (MUCINEX) 600 MG 12 hr tablet, Take 2 tablets (1,200 mg total) by mouth 2 (two) times daily as needed for cough or to loosen phlegm., Disp: 120 tablet, Rfl: 2 .  hydrOXYzine (ATARAX/VISTARIL) 10 MG tablet, TAKE 1 TO 2 TABLETS(10 TO 20 MG) BY MOUTH THREE TIMES DAILY AS NEEDED FOR ANXIETY OR INSOMNIA, Disp: 180 tablet, Rfl: 1 .  Multiple Vitamin (MULTIVITAMIN WITH MINERALS) TABS tablet, Take 1  tablet by mouth daily., Disp: , Rfl:  .  omeprazole (PRILOSEC OTC) 20 MG tablet, Take 1 tablet (20 mg total) by mouth daily., Disp: 90 tablet, Rfl: 1 .  OXYGEN, Inhale 2.5 L into the lungs., Disp: , Rfl:  .  sertraline (ZOLOFT) 100 MG tablet, Take 1 tablet (100 mg total) by mouth daily., Disp: 90 tablet, Rfl: 1  Current Facility-Administered Medications:  .  ibuprofen (ADVIL,MOTRIN) tablet 200 mg, 200 mg, Oral, Q6H PRN, [place], Chioma E, NP  Depression screen [facility] 2/9 05/27/2019 11/13/2018 08/13/2018  Decreased Interest 1 0 0  Down, Depressed, Hopeless 1 0 1  PHQ - 2 Score 2 0 1  Altered sleeping 1 1 0  Tired, decreased energy 1 1 1   Change in appetite 0 0 0  Feeling bad or failure about yourself  0 0 0  Trouble concentrating 0 0 0  Moving slowly or fidgety/restless 1 0 1  Suicidal thoughts 0 0 0  PHQ-9 Score 5 2 3   Difficult doing work/chores Not difficult at all Not difficult at all Not difficult at all    GAD 7 : Generalized Anxiety Score 08/13/2018 05/14/2018  Nervous, Anxious, on Edge 1 2  Control/stop worrying 0 3  Worry too much - different things 1 3  Trouble relaxing 0 2  Restless 0 0  Easily annoyed or irritable 0 2  Afraid - awful might happen 0 3  Total GAD 7 Score 2 15  Anxiety Difficulty Not difficult at all Not difficult at all    -------------------------------------------------------------------------- O: No physical exam performed due to remote telephone encounter.  Physical Exam: Patient remotely monitored without video.  Verbal communication appropriate.  Cognition normal.  No results found for this or any previous visit (from the past 2160 hour(s)).  -------------------------------------------------------------------------- A&P:  Problem List Items Addressed This Visit      Cardiovascular and Mediastinum   Essential hypertension    Has not been taking blood pressure readings at home but has continued to take amlodipine 5mg  daily.  Reports is  tolerating well without side effects.  Plan: 1. Continue to take amlodipine 5mg  daily 2. Resume taking blood pressure twice per week, recording in a log, and bringing to clinic for your next visit.  If you are finding readings consistently greater than 130/80 to call 05/16/2018 sooner 3. We will see you back in clinic in 3 months for re-evaluation.      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  Discussed safety issues with increasing oxygen amount and COPD drive to breathe.  Patient states recently her sister had increased her oxygen without her knowing.  Patient verbalized understanding and reports will not increase  past the 3L of oxygen and will not allow family members to increase her oxygen either.  Plan: 1. Continue medications for COPD as directed 2. Contact pulmonary office and update them on increase in oxygen from 2.5L to 3L 3. We will see you back in clinic in 3 months for next follow up visit      Relevant Medications   guaiFENesin (MUCINEX) 600 MG 12 hr tablet   cetirizine (ZYRTEC) 10 MG tablet   budesonide (PULMICORT) 0.5 MG/2ML nebulizer solution   Seasonal allergic rhinitis due to pollen    Stable with current treatment of certirizine 10mg  daily.  Plan: 1. Continue certirizine 10mg  daily      Relevant Medications   cetirizine (ZYRTEC) 10 MG tablet     Other   Anxiety and depression    Currently stable and well controlled with hydroxyzine 10mg  and sertraline 100mg  daily.  Plan: 1. Continue hydroxyzine 10mg  and sertraline 100mg  daily. 2. Return to clinic in 3 months for next follow up visit      Relevant Medications   sertraline (ZOLOFT) 100 MG tablet   hydrOXYzine (ATARAX/VISTARIL) 10 MG tablet    Elevated lipids    Status unknown.  Recheck labs.  Continue meds without changes today.  Refills provided. Followup after labs.       Relevant Medications   atorvastatin (LIPITOR) 40 MG tablet    Other Visit Diagnoses    Gastroesophageal reflux disease, unspecified whether esophagitis present    -  Primary   Relevant Medications   omeprazole (PRILOSEC OTC) 20 MG tablet   Muscle spasm       Relevant Medications   cyclobenzaprine (FLEXERIL) 10 MG tablet   COPD with acute exacerbation (HCC)       Relevant Medications   guaiFENesin (MUCINEX) 600 MG 12 hr tablet   cetirizine (ZYRTEC) 10 MG tablet   budesonide (PULMICORT) 0.5 MG/2ML nebulizer solution   Wheezing       Relevant Medications   budesonide (PULMICORT) 0.5 MG/2ML nebulizer solution      Meds ordered this encounter  Medications  . sertraline (ZOLOFT) 100 MG tablet    Sig: Take 1 tablet (100 mg total) by mouth daily.    Dispense:  90 tablet    Refill:  1  . hydrOXYzine (ATARAX/VISTARIL) 10 MG tablet    Sig: TAKE 1 TO 2 TABLETS(10 TO 20 MG) BY MOUTH THREE TIMES DAILY AS NEEDED FOR ANXIETY OR INSOMNIA    Dispense:  180 tablet    Refill:  1  . omeprazole (PRILOSEC OTC) 20 MG tablet    Sig: Take 1 tablet (20 mg total) by mouth daily.    Dispense:  90 tablet    Refill:  1  . guaiFENesin (MUCINEX) 600 MG 12 hr tablet    Sig: Take 2 tablets (1,200 mg total) by mouth 2 (two) times daily as needed for cough or to loosen phlegm.    Dispense:  120 tablet    Refill:  2  . cetirizine (ZYRTEC) 10 MG tablet    Sig: Take 1 tablet (10 mg total) by mouth daily.    Dispense:  30 tablet    Refill:  5  . cyclobenzaprine (FLEXERIL) 10 MG tablet    Sig: TAKE 1 TABLET BY MOUTH 2 TIMES DAILY AS NEEDED FOR MUSCLE SPASMS    Dispense:  60 tablet    Refill:  5  . atorvastatin (LIPITOR) 40 MG tablet    Sig: Take 1 tablet (40 mg total)  by mouth daily.    Dispense:  30 tablet    Refill:  0    Patient needs appt  . amLODipine (NORVASC) 5  MG tablet    Sig: Take 1 tablet (5 mg total) by mouth daily.    Dispense:  30 tablet    Refill:  2  . budesonide (PULMICORT) 0.5 MG/2ML nebulizer solution    Sig: Take 2 mLs (0.5 mg total) by nebulization 2 (two) times daily.    Dispense:  1440 mL    Refill:  10    Follow-up: - Return in 3 months for in person office visit, labs, and to review preventative health screenings is due for  Patient verbalizes understanding with the above medical recommendations including the limitation of remote medical advice.  Specific follow-up and call-back criteria were given for patient to follow-up or seek medical care more urgently if needed.   - Time spent in direct consultation with patient on phone: 9 minutes  Harlin Rain, [facility] 05/27/2019, 11:19 AM

## 2019-05-27 NOTE — Assessment & Plan Note (Signed)
Status unknown.  Recheck labs.  Continue meds without changes today.  Refills provided. Followup after labs.  

## 2019-05-27 NOTE — Assessment & Plan Note (Signed)
Has not been taking blood pressure readings at home but has continued to take amlodipine 5mg  daily.  Reports is tolerating well without side effects.  Plan: 1. Continue to take amlodipine 5mg  daily 2. Resume taking blood pressure twice per week, recording in a log, and bringing to clinic for your next visit.  If you are finding readings consistently greater than 130/80 to call sooner 3. We will see you back in clinic in 3 months for re-evaluation.

## 2019-05-27 NOTE — Patient Instructions (Signed)
As we discussed, I have sent in refills on your prescriptions to your pharmacy on file.  We will plan to see you back in clinic in 3 months for follow up on your hypertension, hyperlipidemia and COPD.  Do not increase your oxygen or allow family members to increase your oxygen unless you have spoken with your pulmonologist's office.  You will receive a survey after today's visit either digitally by e-mail or paper by [employer]. Your experiences and feedback matter to [name].  Please respond so we know how we are doing as we provide care for you.  Call us with any questions/concerns/needs.  It is my goal to be available to you for your health concerns.  Thanks for choosing me to be a partner in your healthcare needs!  Charlaine Dalton, FNP-C Family Nurse Practitioner [facility] Phone: [phone]

## 2019-05-27 NOTE — Assessment & Plan Note (Signed)
Reports has recently increased her oxygen from 2.5L continuous to 3L continuous but has not notified pulmonologist office.  Discussed safety issues with increasing oxygen amount and COPD drive to breathe.  Patient states recently her sister had increased her oxygen without her knowing.  Patient verbalized understanding and reports will not increase past the 3L of oxygen and will not allow family members to increase her oxygen either.  Plan: 1. Continue medications for COPD as directed 2. Contact pulmonary office and update them on increase in oxygen from 2.5L to 3L 3. We will see you back in clinic in 3 months for next follow up visit

## 2019-05-27 NOTE — Assessment & Plan Note (Signed)
Currently stable and well controlled with hydroxyzine 10mg  and sertraline 100mg  daily.  Plan: 1. Continue hydroxyzine 10mg  and sertraline 100mg  daily. 2. Return to clinic in 3 months for next follow up visit

## 2019-05-27 NOTE — Assessment & Plan Note (Signed)
Stable with current treatment of certirizine 10mg  daily.  Plan: 1. Continue certirizine 10mg  daily

## 2019-06-21 ENCOUNTER — Encounter: Payer: Self-pay | Admitting: Emergency Medicine

## 2019-06-21 ENCOUNTER — Other Ambulatory Visit: Payer: Self-pay

## 2019-06-21 ENCOUNTER — Emergency Department: Payer: Medicaid Other

## 2019-06-21 DIAGNOSIS — Z885 Allergy status to narcotic agent status: Secondary | ICD-10-CM

## 2019-06-21 DIAGNOSIS — Z79899 Other long term (current) drug therapy: Secondary | ICD-10-CM

## 2019-06-21 DIAGNOSIS — Z20822 Contact with and (suspected) exposure to covid-19: Secondary | ICD-10-CM | POA: Diagnosis present

## 2019-06-21 DIAGNOSIS — Z7951 Long term (current) use of inhaled steroids: Secondary | ICD-10-CM

## 2019-06-21 DIAGNOSIS — Z8379 Family history of other diseases of the digestive system: Secondary | ICD-10-CM

## 2019-06-21 DIAGNOSIS — R7989 Other specified abnormal findings of blood chemistry: Secondary | ICD-10-CM | POA: Diagnosis not present

## 2019-06-21 DIAGNOSIS — J441 Chronic obstructive pulmonary disease with (acute) exacerbation: Secondary | ICD-10-CM | POA: Diagnosis not present

## 2019-06-21 DIAGNOSIS — I248 Other forms of acute ischemic heart disease: Secondary | ICD-10-CM | POA: Diagnosis present

## 2019-06-21 DIAGNOSIS — J439 Emphysema, unspecified: Secondary | ICD-10-CM | POA: Diagnosis present

## 2019-06-21 DIAGNOSIS — Z9981 Dependence on supplemental oxygen: Secondary | ICD-10-CM

## 2019-06-21 DIAGNOSIS — R000 Tachycardia, unspecified: Secondary | ICD-10-CM | POA: Diagnosis not present

## 2019-06-21 DIAGNOSIS — D72829 Elevated white blood cell count, unspecified: Secondary | ICD-10-CM | POA: Diagnosis present

## 2019-06-21 DIAGNOSIS — Z90710 Acquired absence of both cervix and uterus: Secondary | ICD-10-CM

## 2019-06-21 DIAGNOSIS — F329 Major depressive disorder, single episode, unspecified: Secondary | ICD-10-CM | POA: Diagnosis present

## 2019-06-21 DIAGNOSIS — J42 Unspecified chronic bronchitis: Secondary | ICD-10-CM | POA: Diagnosis present

## 2019-06-21 DIAGNOSIS — Z7982 Long term (current) use of aspirin: Secondary | ICD-10-CM

## 2019-06-21 DIAGNOSIS — F17210 Nicotine dependence, cigarettes, uncomplicated: Secondary | ICD-10-CM | POA: Diagnosis present

## 2019-06-21 DIAGNOSIS — J939 Pneumothorax, unspecified: Secondary | ICD-10-CM | POA: Diagnosis not present

## 2019-06-21 DIAGNOSIS — Z923 Personal history of irradiation: Secondary | ICD-10-CM

## 2019-06-21 DIAGNOSIS — J9383 Other pneumothorax: Principal | ICD-10-CM | POA: Diagnosis present

## 2019-06-21 DIAGNOSIS — Z8249 Family history of ischemic heart disease and other diseases of the circulatory system: Secondary | ICD-10-CM

## 2019-06-21 DIAGNOSIS — I10 Essential (primary) hypertension: Secondary | ICD-10-CM | POA: Diagnosis present

## 2019-06-21 DIAGNOSIS — Z880 Allergy status to penicillin: Secondary | ICD-10-CM

## 2019-06-21 DIAGNOSIS — J9621 Acute and chronic respiratory failure with hypoxia: Secondary | ICD-10-CM | POA: Diagnosis present

## 2019-06-21 DIAGNOSIS — E785 Hyperlipidemia, unspecified: Secondary | ICD-10-CM | POA: Diagnosis present

## 2019-06-21 LAB — BASIC METABOLIC PANEL
Anion gap: 12 (ref 5–15)
BUN: 16 mg/dL (ref 6–20)
CO2: 39 mmol/L — ABNORMAL HIGH (ref 22–32)
Calcium: 9.8 mg/dL (ref 8.9–10.3)
Chloride: 83 mmol/L — ABNORMAL LOW (ref 98–111)
Creatinine, Ser: 0.74 mg/dL (ref 0.44–1.00)
GFR calc Af Amer: >60 mL/min (ref >60)
GFR calc non Af Amer: >60 mL/min (ref >60)
Glucose, Bld: 126 mg/dL — ABNORMAL HIGH (ref 70–99)
Potassium: 3.9 mmol/L (ref 3.5–5.1)
Sodium: 134 mmol/L — ABNORMAL LOW (ref 135–145)

## 2019-06-21 LAB — CBC WITH DIFFERENTIAL/PLATELET
Abs Immature Granulocytes: 0.07 10*3/uL (ref 0.00–0.07)
Basophils Absolute: 0.1 10*3/uL (ref 0.0–0.1)
Basophils Relative: 0 %
Eosinophils Absolute: 0.1 10*3/uL (ref 0.0–0.5)
Eosinophils Relative: 1 %
HCT: 43.3 % (ref 36.0–46.0)
Hemoglobin: 13.8 g/dL (ref 12.0–15.0)
Immature Granulocytes: 1 %
Lymphocytes Relative: 7 %
Lymphs Abs: 1 10*3/uL (ref 0.7–4.0)
MCH: 29.8 pg (ref 26.0–34.0)
MCHC: 31.9 g/dL (ref 30.0–36.0)
MCV: 93.5 fL (ref 80.0–100.0)
Monocytes Absolute: 1.1 10*3/uL — ABNORMAL HIGH (ref 0.1–1.0)
Monocytes Relative: 8 %
Neutro Abs: 11.5 10*3/uL — ABNORMAL HIGH (ref 1.7–7.7)
Neutrophils Relative %: 83 %
Platelets: 272 10*3/uL (ref 150–400)
RBC: 4.63 MIL/uL (ref 3.87–5.11)
RDW: 13 % (ref 11.5–15.5)
WBC: 13.7 10*3/uL — ABNORMAL HIGH (ref 4.0–10.5)
nRBC: 0 % (ref 0.0–0.2)

## 2019-06-21 LAB — TROPONIN I (HIGH SENSITIVITY): Troponin I (High Sensitivity): 70 ng/L — ABNORMAL HIGH (ref <18)

## 2019-06-21 MED ORDER — ALBUTEROL SULFATE (2.5 MG/3ML) 0.083% IN NEBU
5.0000 mg | INHALATION_SOLUTION | Freq: Once | RESPIRATORY_TRACT | Status: AC
  Administered 2019-06-21: 5 mg via RESPIRATORY_TRACT
  Filled 2019-06-21: qty 6

## 2019-06-21 NOTE — ED Triage Notes (Signed)
Pt arrives POV to triage with c/o worsening SOB. Pt reports being end stage COPD and has a hard work of breathing at this time. Pt states that she has not been able to eat or drink anything x 3 days.

## 2019-06-22 ENCOUNTER — Inpatient Hospital Stay: Payer: Medicaid Other

## 2019-06-22 ENCOUNTER — Inpatient Hospital Stay
Admission: EM | Admit: 2019-06-22 | Discharge: 2019-06-25 | DRG: 199 | Disposition: A | Discharge status: Home Health | Payer: Medicaid Other | Attending: Internal Medicine | Admitting: Internal Medicine

## 2019-06-22 DIAGNOSIS — F419 Anxiety disorder, unspecified: Secondary | ICD-10-CM | POA: Diagnosis not present

## 2019-06-22 DIAGNOSIS — J90 Pleural effusion, not elsewhere classified: Secondary | ICD-10-CM | POA: Diagnosis not present

## 2019-06-22 DIAGNOSIS — F329 Major depressive disorder, single episode, unspecified: Secondary | ICD-10-CM | POA: Diagnosis not present

## 2019-06-22 DIAGNOSIS — J439 Emphysema, unspecified: Secondary | ICD-10-CM | POA: Diagnosis present

## 2019-06-22 DIAGNOSIS — J449 Chronic obstructive pulmonary disease, unspecified: Secondary | ICD-10-CM | POA: Diagnosis not present

## 2019-06-22 DIAGNOSIS — J431 Panlobular emphysema: Secondary | ICD-10-CM | POA: Diagnosis not present

## 2019-06-22 DIAGNOSIS — J939 Pneumothorax, unspecified: Secondary | ICD-10-CM | POA: Diagnosis present

## 2019-06-22 DIAGNOSIS — R918 Other nonspecific abnormal finding of lung field: Secondary | ICD-10-CM | POA: Diagnosis not present

## 2019-06-22 DIAGNOSIS — I10 Essential (primary) hypertension: Secondary | ICD-10-CM | POA: Diagnosis not present

## 2019-06-22 DIAGNOSIS — Z79899 Other long term (current) drug therapy: Secondary | ICD-10-CM | POA: Diagnosis not present

## 2019-06-22 DIAGNOSIS — J9621 Acute and chronic respiratory failure with hypoxia: Secondary | ICD-10-CM | POA: Diagnosis not present

## 2019-06-22 DIAGNOSIS — R778 Other specified abnormalities of plasma proteins: Secondary | ICD-10-CM

## 2019-06-22 DIAGNOSIS — J441 Chronic obstructive pulmonary disease with (acute) exacerbation: Secondary | ICD-10-CM | POA: Diagnosis present

## 2019-06-22 DIAGNOSIS — J9611 Chronic respiratory failure with hypoxia: Secondary | ICD-10-CM

## 2019-06-22 DIAGNOSIS — Z7951 Long term (current) use of inhaled steroids: Secondary | ICD-10-CM | POA: Diagnosis not present

## 2019-06-22 DIAGNOSIS — Z8379 Family history of other diseases of the digestive system: Secondary | ICD-10-CM | POA: Diagnosis not present

## 2019-06-22 DIAGNOSIS — R000 Tachycardia, unspecified: Secondary | ICD-10-CM | POA: Diagnosis not present

## 2019-06-22 DIAGNOSIS — Z8249 Family history of ischemic heart disease and other diseases of the circulatory system: Secondary | ICD-10-CM | POA: Diagnosis not present

## 2019-06-22 DIAGNOSIS — J9610 Chronic respiratory failure, unspecified whether with hypoxia or hypercapnia: Secondary | ICD-10-CM

## 2019-06-22 DIAGNOSIS — Z7982 Long term (current) use of aspirin: Secondary | ICD-10-CM | POA: Diagnosis not present

## 2019-06-22 DIAGNOSIS — Z90710 Acquired absence of both cervix and uterus: Secondary | ICD-10-CM | POA: Diagnosis not present

## 2019-06-22 DIAGNOSIS — Z20822 Contact with and (suspected) exposure to covid-19: Secondary | ICD-10-CM | POA: Diagnosis not present

## 2019-06-22 DIAGNOSIS — J9811 Atelectasis: Secondary | ICD-10-CM | POA: Diagnosis not present

## 2019-06-22 DIAGNOSIS — Z4682 Encounter for fitting and adjustment of non-vascular catheter: Secondary | ICD-10-CM | POA: Diagnosis not present

## 2019-06-22 DIAGNOSIS — D72829 Elevated white blood cell count, unspecified: Secondary | ICD-10-CM | POA: Diagnosis present

## 2019-06-22 DIAGNOSIS — J42 Unspecified chronic bronchitis: Secondary | ICD-10-CM | POA: Diagnosis present

## 2019-06-22 DIAGNOSIS — E785 Hyperlipidemia, unspecified: Secondary | ICD-10-CM | POA: Diagnosis present

## 2019-06-22 DIAGNOSIS — Z885 Allergy status to narcotic agent status: Secondary | ICD-10-CM | POA: Diagnosis not present

## 2019-06-22 DIAGNOSIS — Z9981 Dependence on supplemental oxygen: Secondary | ICD-10-CM | POA: Diagnosis not present

## 2019-06-22 DIAGNOSIS — J4489 Other specified chronic obstructive pulmonary disease: Secondary | ICD-10-CM | POA: Diagnosis present

## 2019-06-22 DIAGNOSIS — I248 Other forms of acute ischemic heart disease: Secondary | ICD-10-CM | POA: Diagnosis present

## 2019-06-22 DIAGNOSIS — R7989 Other specified abnormal findings of blood chemistry: Secondary | ICD-10-CM

## 2019-06-22 DIAGNOSIS — Z880 Allergy status to penicillin: Secondary | ICD-10-CM | POA: Diagnosis not present

## 2019-06-22 DIAGNOSIS — F17210 Nicotine dependence, cigarettes, uncomplicated: Secondary | ICD-10-CM | POA: Diagnosis present

## 2019-06-22 DIAGNOSIS — J9383 Other pneumothorax: Secondary | ICD-10-CM | POA: Diagnosis present

## 2019-06-22 DIAGNOSIS — Z923 Personal history of irradiation: Secondary | ICD-10-CM | POA: Diagnosis not present

## 2019-06-22 DIAGNOSIS — F32A Depression, unspecified: Secondary | ICD-10-CM | POA: Diagnosis present

## 2019-06-22 LAB — CBC WITH DIFFERENTIAL/PLATELET
Abs Immature Granulocytes: 0.05 10*3/uL (ref 0.00–0.07)
Basophils Absolute: 0 10*3/uL (ref 0.0–0.1)
Basophils Relative: 0 %
Eosinophils Absolute: 0 10*3/uL (ref 0.0–0.5)
Eosinophils Relative: 0 %
HCT: 40.6 % (ref 36.0–46.0)
Hemoglobin: 12.7 g/dL (ref 12.0–15.0)
Immature Granulocytes: 0 %
Lymphocytes Relative: 2 %
Lymphs Abs: 0.3 10*3/uL — ABNORMAL LOW (ref 0.7–4.0)
MCH: 29.3 pg (ref 26.0–34.0)
MCHC: 31.3 g/dL (ref 30.0–36.0)
MCV: 93.8 fL (ref 80.0–100.0)
Monocytes Absolute: 0.2 10*3/uL (ref 0.1–1.0)
Monocytes Relative: 2 %
Neutro Abs: 10.8 10*3/uL — ABNORMAL HIGH (ref 1.7–7.7)
Neutrophils Relative %: 96 %
Platelets: 220 10*3/uL (ref 150–400)
RBC: 4.33 MIL/uL (ref 3.87–5.11)
RDW: 12.8 % (ref 11.5–15.5)
WBC: 11.3 10*3/uL — ABNORMAL HIGH (ref 4.0–10.5)
nRBC: 0 % (ref 0.0–0.2)

## 2019-06-22 LAB — COMPREHENSIVE METABOLIC PANEL
ALT: 17 U/L (ref 0–44)
AST: 21 U/L (ref 15–41)
Albumin: 4.1 g/dL (ref 3.5–5.0)
Alkaline Phosphatase: 61 U/L (ref 38–126)
Anion gap: 11 (ref 5–15)
BUN: 21 mg/dL — ABNORMAL HIGH (ref 6–20)
CO2: 40 mmol/L — ABNORMAL HIGH (ref 22–32)
Calcium: 9.1 mg/dL (ref 8.9–10.3)
Chloride: 84 mmol/L — ABNORMAL LOW (ref 98–111)
Creatinine, Ser: 0.79 mg/dL (ref 0.44–1.00)
GFR calc Af Amer: >60 mL/min (ref >60)
GFR calc non Af Amer: >60 mL/min (ref >60)
Glucose, Bld: 200 mg/dL — ABNORMAL HIGH (ref 70–99)
Potassium: 4.5 mmol/L (ref 3.5–5.1)
Sodium: 135 mmol/L (ref 135–145)
Total Bilirubin: 0.6 mg/dL (ref 0.3–1.2)
Total Protein: 7.8 g/dL (ref 6.5–8.1)

## 2019-06-22 LAB — LACTIC ACID, PLASMA
Lactic Acid, Venous: 1 mmol/L (ref 0.5–1.9)
Lactic Acid, Venous: 1.1 mmol/L (ref 0.5–1.9)
Lactic Acid, Venous: 1.2 mmol/L (ref 0.5–1.9)

## 2019-06-22 LAB — HIV ANTIBODY (ROUTINE TESTING W REFLEX): HIV Screen 4th Generation wRfx: NONREACTIVE

## 2019-06-22 LAB — SARS CORONAVIRUS 2 BY RT PCR (HOSPITAL ORDER, PERFORMED IN ~~LOC~~ HOSPITAL LAB): SARS Coronavirus 2: NEGATIVE

## 2019-06-22 LAB — MAGNESIUM: Magnesium: 2.1 mg/dL (ref 1.7–2.4)

## 2019-06-22 LAB — PHOSPHORUS: Phosphorus: 5.7 mg/dL — ABNORMAL HIGH (ref 2.5–4.6)

## 2019-06-22 LAB — TROPONIN I (HIGH SENSITIVITY): Troponin I (High Sensitivity): 60 ng/L — ABNORMAL HIGH (ref <18)

## 2019-06-22 MED ORDER — ARFORMOTEROL TARTRATE 15 MCG/2ML IN NEBU
15.0000 ug | INHALATION_SOLUTION | Freq: Two times a day (BID) | RESPIRATORY_TRACT | Status: DC
  Administered 2019-06-23 – 2019-06-24 (×5): 15 ug via RESPIRATORY_TRACT
  Filled 2019-06-22 (×9): qty 2

## 2019-06-22 MED ORDER — ASPIRIN EC 81 MG PO TBEC
81.0000 mg | DELAYED_RELEASE_TABLET | Freq: Every day | ORAL | Status: DC
  Administered 2019-06-22 – 2019-06-25 (×4): 81 mg via ORAL
  Filled 2019-06-22 (×4): qty 1

## 2019-06-22 MED ORDER — ACETAMINOPHEN 500 MG PO TABS
1000.0000 mg | ORAL_TABLET | Freq: Four times a day (QID) | ORAL | Status: DC
  Administered 2019-06-22 – 2019-06-25 (×13): 1000 mg via ORAL
  Filled 2019-06-22 (×14): qty 2

## 2019-06-22 MED ORDER — METHYLPREDNISOLONE SODIUM SUCC 125 MG IJ SOLR
125.0000 mg | Freq: Once | INTRAMUSCULAR | Status: AC
  Administered 2019-06-22: 125 mg via INTRAVENOUS
  Filled 2019-06-22: qty 2

## 2019-06-22 MED ORDER — BUDESONIDE 0.5 MG/2ML IN SUSP
0.5000 mg | Freq: Two times a day (BID) | RESPIRATORY_TRACT | Status: DC
  Administered 2019-06-22 – 2019-06-25 (×7): 0.5 mg via RESPIRATORY_TRACT
  Filled 2019-06-22 (×7): qty 2

## 2019-06-22 MED ORDER — AMLODIPINE BESYLATE 5 MG PO TABS
5.0000 mg | ORAL_TABLET | Freq: Every day | ORAL | Status: DC
  Administered 2019-06-22 – 2019-06-25 (×4): 5 mg via ORAL
  Filled 2019-06-22 (×4): qty 1

## 2019-06-22 MED ORDER — PREDNISONE 20 MG PO TABS
40.0000 mg | ORAL_TABLET | Freq: Every day | ORAL | Status: DC
  Administered 2019-06-22 – 2019-06-25 (×4): 40 mg via ORAL
  Filled 2019-06-22 (×4): qty 2

## 2019-06-22 MED ORDER — LEVOFLOXACIN IN D5W 750 MG/150ML IV SOLN
750.0000 mg | INTRAVENOUS | Status: DC
  Administered 2019-06-22 – 2019-06-25 (×4): 750 mg via INTRAVENOUS
  Filled 2019-06-22 (×4): qty 150

## 2019-06-22 MED ORDER — OMEPRAZOLE MAGNESIUM 20 MG PO TBEC: 20.0000 mg | DELAYED_RELEASE_TABLET | Freq: Every day | ORAL | Status: DC

## 2019-06-22 MED ORDER — MORPHINE SULFATE (PF) 2 MG/ML IV SOLN
2.0000 mg | Freq: Once | INTRAVENOUS | Status: AC
  Administered 2019-06-22: 2 mg via INTRAVENOUS
  Filled 2019-06-22: qty 1

## 2019-06-22 MED ORDER — FENTANYL CITRATE (PF) 100 MCG/2ML IJ SOLN
50.0000 ug | Freq: Once | INTRAMUSCULAR | Status: AC
  Administered 2019-06-22: 50 ug via INTRAVENOUS

## 2019-06-22 MED ORDER — SERTRALINE HCL 50 MG PO TABS
100.0000 mg | ORAL_TABLET | Freq: Every day | ORAL | Status: DC
  Administered 2019-06-22 – 2019-06-25 (×4): 100 mg via ORAL
  Filled 2019-06-22 (×4): qty 2

## 2019-06-22 MED ORDER — ENOXAPARIN SODIUM 40 MG/0.4ML ~~LOC~~ SOLN
40.0000 mg | SUBCUTANEOUS | Status: DC
  Administered 2019-06-23 – 2019-06-24 (×3): 40 mg via SUBCUTANEOUS
  Filled 2019-06-22 (×3): qty 0.4

## 2019-06-22 MED ORDER — IPRATROPIUM BROMIDE 0.02 % IN SOLN
0.5000 mg | Freq: Four times a day (QID) | RESPIRATORY_TRACT | Status: DC
  Administered 2019-06-22 – 2019-06-25 (×10): 0.5 mg via RESPIRATORY_TRACT
  Filled 2019-06-22 (×10): qty 2.5

## 2019-06-22 MED ORDER — IPRATROPIUM-ALBUTEROL 0.5-2.5 (3) MG/3ML IN SOLN
3.0000 mL | Freq: Once | RESPIRATORY_TRACT | Status: AC
  Administered 2019-06-22: 3 mL via RESPIRATORY_TRACT
  Filled 2019-06-22: qty 3

## 2019-06-22 MED ORDER — LORATADINE 10 MG PO TABS
10.0000 mg | ORAL_TABLET | Freq: Every day | ORAL | Status: DC
  Administered 2019-06-22 – 2019-06-25 (×4): 10 mg via ORAL
  Filled 2019-06-22 (×4): qty 1

## 2019-06-22 MED ORDER — OXYCODONE HCL 5 MG PO TABS
5.0000 mg | ORAL_TABLET | ORAL | Status: DC | PRN
  Administered 2019-06-23 – 2019-06-24 (×3): 10 mg via ORAL
  Filled 2019-06-22 (×3): qty 2

## 2019-06-22 MED ORDER — KETOROLAC TROMETHAMINE 30 MG/ML IJ SOLN
15.0000 mg | Freq: Four times a day (QID) | INTRAMUSCULAR | Status: DC
  Administered 2019-06-22 – 2019-06-25 (×14): 15 mg via INTRAVENOUS
  Filled 2019-06-22 (×14): qty 1

## 2019-06-22 MED ORDER — ATORVASTATIN CALCIUM 20 MG PO TABS
40.0000 mg | ORAL_TABLET | Freq: Every day | ORAL | Status: DC
  Administered 2019-06-22 – 2019-06-25 (×4): 40 mg via ORAL
  Filled 2019-06-22 (×4): qty 2

## 2019-06-22 MED ORDER — LEVALBUTEROL HCL 0.63 MG/3ML IN NEBU
0.6300 mg | INHALATION_SOLUTION | RESPIRATORY_TRACT | Status: DC | PRN
  Filled 2019-06-22 (×2): qty 3

## 2019-06-22 MED ORDER — ONDANSETRON HCL 4 MG/2ML IJ SOLN
4.0000 mg | Freq: Once | INTRAMUSCULAR | Status: AC
  Administered 2019-06-22: 4 mg via INTRAVENOUS
  Filled 2019-06-22: qty 2

## 2019-06-22 MED ORDER — HYDROXYZINE HCL 25 MG PO TABS
25.0000 mg | ORAL_TABLET | Freq: Three times a day (TID) | ORAL | Status: DC | PRN
  Administered 2019-06-23 – 2019-06-24 (×3): 25 mg via ORAL
  Filled 2019-06-22 (×3): qty 1

## 2019-06-22 MED ORDER — HYDROMORPHONE HCL 1 MG/ML IJ SOLN: 0.5000 mg | INTRAMUSCULAR | Status: DC | PRN

## 2019-06-22 MED ORDER — NICOTINE 21 MG/24HR TD PT24
21.0000 mg | MEDICATED_PATCH | Freq: Every day | TRANSDERMAL | Status: DC
  Administered 2019-06-22 – 2019-06-25 (×4): 21 mg via TRANSDERMAL
  Filled 2019-06-22 (×4): qty 1

## 2019-06-22 MED ORDER — GUAIFENESIN ER 600 MG PO TB12: 1200.0000 mg | ORAL_TABLET | Freq: Two times a day (BID) | ORAL | Status: DC | PRN

## 2019-06-22 MED ORDER — PANTOPRAZOLE SODIUM 20 MG PO TBEC
20.0000 mg | DELAYED_RELEASE_TABLET | Freq: Every day | ORAL | Status: DC
  Administered 2019-06-22 – 2019-06-25 (×4): 20 mg via ORAL
  Filled 2019-06-22 (×5): qty 1

## 2019-06-22 MED ORDER — FENTANYL CITRATE (PF) 100 MCG/2ML IJ SOLN
INTRAMUSCULAR | Status: AC
  Filled 2019-06-22: qty 2

## 2019-06-22 NOTE — ED Notes (Signed)
Provider at bedside. Provider talked through procedure with pt and filled out consent with this RN as witness. All necessary equipment at bedside for provider.

## 2019-06-22 NOTE — ED Notes (Addendum)
This RN, Colin Mulders RN, EDP Milana Kidney and Mayra NT at bedside. Provider Piscoya at bedside; provider removed BP cuff stating it isn't necessary at the moment. Pt on high flow O2 on nonrebreather mask.

## 2019-06-22 NOTE — Progress Notes (Signed)
PROGRESS NOTE    Alicia Gibson  WCB:[id number] DOB: 1960/08/21 DOA: 06/22/2019 PCP: [name], FNP     Brief Narrative:  Alicia Gibson is 59 y.o. 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 Has needed to use 4L O2 for past several days. Endorses subjective fever, wet sounding cough, wheezing and shortness of breath. Did 2 nebulizer treatments prior to arrival. Also states she has not been able to eat or drink anything for the past 3 days. Denies chest pain, abdominal pain, nausea, vomiting or diarrhea.   ED work-up/course:  59 year old female with COPD on nasal cannula O2 presenting with subjective fever, cough, wheezing and shortness of breath.Differential includes, but is not limited to, viral syndrome, bronchitis including COPD exacerbation, pneumonia, reactive airway disease including asthma, CHF including exacerbation with or without pulmonary/interstitial edema, pneumothorax, ACS, thoracic trauma, and pulmonary embolism.  Laboratory results demonstrate mild leukocytosis, elevated troponin which is likely stress leak. Will administer DuoNeb which will make patient's fourth nebulizer treatment tonight, 125 mg Solu-Medrol. Will discuss with hospitalist services for admission.   Subjective: A/O x4, positive S OB acute on chronic.  Negative CP, negative abdominal pain.  Continues to smoke.   Assessment & Plan:   Active Problems:   Chronic bronchitis with COPD (chronic obstructive pulmonary disease) (HCC)   Anxiety and depression   Elevated lipids   Essential hypertension   Pulmonary emphysema (HCC)   COPD exacerbation (HCC)   Chronic respiratory failure (HCC)   Elevated troponin   Pneumothorax on left  Acute on chronic respiratory failure with hypoxia -Arformoterol nebulizer 50 mcg BID -Budesonide 0.5 mg BID -Ipratropium nebulizer QID -Xopenex nebulizer  PRN -Prednisone 40 mg daily -Incentive spirometry   COPD -See respiratory failure  Spontaneous LEFT pneumothorax  -5/29 LEFT chest tube placed 14 Fr chest tube inserted -On exam negative air leak, site covered and clean.   Subjective Fevers Reports fevers at home. Afebrile in ED.  -follow blood cultures and lactic acid ordered by ED provider   Elevated Troponin  Troponin I elevated to 70. Most likely related to demand ischemia. EKG without acute ST segment changes.  -trend troponins  -repeat EKG in AM   HTN  BP stable at admit.  -Amlodipine 5 mg   HLD  -continue ASA and statin therapy   Anxiety/Depression  -continue Sertraline 100 mg   Tobacco abuse -Nicotine patch    DVT prophylaxis: Lovenox Code Status: Full Family Communication:  Disposition Plan:  Status is: Inpatient    Dispo: The patient is from: Home              Anticipated d/c is to: Home              Anticipated d/c date is: 7 June              Patient currently unstable      Consultants:  Surgery Dr. Ernesto Rutherford    Procedures/Significant Events:  LEFT chest tube placed 14 Fr chest tube inserted5/29>>>    I have personally reviewed and interpreted all radiology studies and my findings are as above.  VENTILATOR SETTINGS:    Cultures 5/29 blood pending 5/29 SARS coronavirus negative 5/29 respiratory virus panel pending 5/29 sputum pending   Antimicrobials: Anti-infectives (From admission, onward)   Start     Dose/Rate Stop   06/22/19 0900  levofloxacin (LEVAQUIN) IVPB 750 mg  750 mg 100 mL/hr over 90 Minutes 06/27/19 0859       Devices    LINES / TUBES:      Continuous Infusions:   Objective: Vitals:   06/22/19 0300 06/22/19 0342 06/22/19 0507 06/22/19 0542  BP:  125/78 134/77 138/81  Pulse: (!) 117 (!) 119 (!) 114 (!) 112  Resp:  (!) 26 (!) 25 18  Temp:      TempSrc:      SpO2: 98% 93% 95% 96%  Weight:      Height:       No intake or  output data in the 24 hours ending 06/22/19 0723 Filed Weights   06/21/19 2319  Weight: 59 kg    Examination:  General: A/O x4, positive acute on chronic respiratory distress Eyes: negative scleral hemorrhage, negative anisocoria, negative icterus ENT: Negative Runny nose, negative gingival bleeding, Neck:  Negative scars, masses, torticollis, lymphadenopathy, JVD Lungs: Tachypneic diffuse decreased breath sounds bilaterally, LEFT side positive crackles all fields without wheezes or crackles Cardiovascular: Tachycardic without murmur gallop or rub normal S1 and S2 Abdomen: negative abdominal pain, nondistended, positive soft, bowel sounds, no rebound, no ascites, no appreciable mass Extremities: No significant cyanosis, clubbing, or edema bilateral lower extremities Skin: Negative rashes, lesions, ulcers Psychiatric:  Negative depression, negative anxiety, negative fatigue, negative mania  Central nervous system:  Cranial nerves II through XII intact, tongue/uvula midline, all extremities muscle strength 5/5, sensation intact throughout, finger nose finger bilateral within normal limits, , negative dysarthria, negative expressive aphasia, negative receptive aphasia.  .     Data Reviewed: Care during the described time interval was provided by me .  I have reviewed this patient's available data, including medical history, events of note, physical examination, and all test results as part of my evaluation.  CBC: Recent Labs  Lab 06/21/19 2323  WBC 13.7*  NEUTROABS 11.5*  HGB 13.8  HCT 43.3  MCV 93.5  PLT 124   Basic Metabolic Panel: Recent Labs  Lab 06/21/19 2323  NA 134*  K 3.9  CL 83*  CO2 39*  GLUCOSE 126*  BUN 16  CREATININE 0.74  CALCIUM 9.8   GFR: Estimated Creatinine Clearance: 72.3 mL/min (by C-G formula based on SCr of 0.74 mg/dL). Liver Function Tests: No results for input(s): AST, ALT, ALKPHOS, BILITOT, PROT, ALBUMIN in the last 168 hours. No results for  input(s): LIPASE, AMYLASE in the last 168 hours. No results for input(s): AMMONIA in the last 168 hours. Coagulation Profile: No results for input(s): INR, PROTIME in the last 168 hours. Cardiac Enzymes: No results for input(s): CKTOTAL, CKMB, CKMBINDEX, TROPONINI in the last 168 hours. BNP (last 3 results) No results for input(s): PROBNP in the last 8760 hours. HbA1C: No results for input(s): HGBA1C in the last 72 hours. CBG: No results for input(s): GLUCAP in the last 168 hours. Lipid Profile: No results for input(s): CHOL, HDL, LDLCALC, TRIG, CHOLHDL, LDLDIRECT in the last 72 hours. Thyroid Function Tests: No results for input(s): TSH, T4TOTAL, FREET4, T3FREE, THYROIDAB in the last 72 hours. Anemia Panel: No results for input(s): VITAMINB12, FOLATE, FERRITIN, TIBC, IRON, RETICCTPCT in the last 72 hours. Sepsis Labs: Recent Labs  Lab 06/22/19 0100  LATICACIDVEN 1.0    Recent Results (from the past 240 hour(s))  Culture, blood (routine x 2)     Status: None (Preliminary result)   Collection Time: 06/22/19 12:05 AM   Specimen: BLOOD  Result Value Ref Range Status   Specimen Description BLOOD Blood  Culture adequate volume  Final   Special Requests   Final    BOTTLES DRAWN AEROBIC AND ANAEROBIC BLOOD LEFT FOREARM   Culture   Final    NO GROWTH < 12 HOURS Performed at [facility], [address] [place], [place] [address]    Report Status PENDING  Incomplete  SARS Coronavirus 2 by RT PCR (hospital order, performed in [facility] hospital lab) Nasopharyngeal Nasopharyngeal Swab     Status: None   Collection Time: 06/22/19 12:05 AM   Specimen: Nasopharyngeal Swab  Result Value Ref Range Status   SARS Coronavirus 2 NEGATIVE NEGATIVE Final    Comment: (NOTE) SARS-CoV-2 target nucleic acids are NOT DETECTED. The SARS-CoV-2 RNA is generally detectable in upper and lower respiratory specimens during the acute phase of infection. The lowest concentration of SARS-CoV-2  viral copies this assay can detect is 250 copies / mL. A negative result does not preclude SARS-CoV-2 infection and should not be used as the sole basis for treatment or other patient management decisions.  A negative result may occur with improper specimen collection / handling, submission of specimen other than nasopharyngeal swab, presence of viral mutation(s) within the areas targeted by this assay, and inadequate number of viral copies (<250 copies / mL). A negative result must be combined with clinical observations, patient history, and epidemiological information. Fact Sheet for Patients:   BoilerBrush.com.cy Fact Sheet for Healthcare Providers: https://pope.com/ This test is not yet approved or cleared  by the [place] FDA and has been authorized for detection and/or diagnosis of SARS-CoV-2 by FDA under an Emergency Use Authorization (EUA).  This EUA will remain in effect (meaning this test can be used) for the duration of the COVID-19 declaration under Section 564(b)(1) of the Act, 21 U.S.C. section 360bbb-3(b)(1), unless the authorization is terminated or revoked sooner. Performed at [facility], [address] [place], [place] [address]   Culture, blood (routine x 2)     Status: None (Preliminary result)   Collection Time: 06/22/19 12:10 AM   Specimen: BLOOD  Result Value Ref Range Status   Specimen Description BLOOD Blood Culture adequate volume  Final   Special Requests   Final    BOTTLES DRAWN AEROBIC AND ANAEROBIC RESISTANT WRIST   Culture   Final    NO GROWTH < 12 HOURS Performed at [facility], [address], [place], [place] [address]    Report Status PENDING  Incomplete         Radiology Studies: DG Chest 2 View  Result Date: 06/22/2019 CLINICAL DATA:  Shortness of breath. EXAM: CHEST - 2 VIEW COMPARISON:  Radiograph 12/06/2017 FINDINGS: Large left pneumothorax with near complete  collapse of the left lung. No evidence of tension or mediastinal shift. The left lung is atelectatic. Hyperinflated right lung with emphysema. No pleural fluid or focal airspace disease. Heart is normal in size. No acute osseous abnormalities are seen. IMPRESSION: Large left pneumothorax with near complete collapse of the left lung. No evidence of tension. Critical Value/emergent results were called by telephone at the time of interpretation on 06/22/2019 at 12:33 am to Dr Lesly Rubenstein SUNG , who verbally acknowledged these results. Electronically Signed   By: Narda Rutherford M.D.   On: 06/22/2019 00:33   DG Chest Portable 1 View  Result Date: 06/22/2019 CLINICAL DATA:  Chest tube placement EXAM: PORTABLE CHEST 1 VIEW COMPARISON:  Jun 22, 2019 FINDINGS: The heart size and mediastinal contours are within normal limits. There is interval  placement of a left-sided pigtail catheter with re-expansion of the left lung. No definite residual pneumothorax is seen. Bilateral coarsened interstitial markings are seen throughout both lungs with hyperinflation of the upper lung zones. No pleural effusion. IMPRESSION: Interval placement of left-sided pigtail catheter with no residual pneumothorax seen. Electronically Signed   By: Jonna Clark M.D.   On: 06/22/2019 03:31        Scheduled Meds: . acetaminophen  1,000 mg Oral Q6H  . ketorolac  15 mg Intravenous Q6H   Continuous Infusions:   LOS: 0 days    Time spent:40 min    Henriette Hesser, Roselind Messier, MD [facility] Pager [phone]  If 7PM-7AM, please contact night-coverage [email or web address] Password [facility] 06/22/2019, 7:23 AM

## 2019-06-22 NOTE — ED Notes (Signed)
Pt given phone after mother "Ms Eulis Canner" called, pt updating family att because "my phone is messing up"

## 2019-06-22 NOTE — H&P (Signed)
History and Physical        Hospital Admission Note Date: 06/22/2019  Patient name: Alicia Gibson Medical record number: [id number] Date of birth: 03-Jun-1960 Age: 59 y.o. Gender: female  PCP: [name], FNP    Patient coming from: Home   I have reviewed all records in the [facility].    Chief Complaint:  SOB   HPI: Alicia Gibson is 59 y.o. female with PMH of COPD with chronic respiratory failure on 2.5L O2 at home, HTN, HLD who presents for worsening SOB and increasing O2 requirement. Has needed to use 4L O2 for past several days. Endorses subjective fever, wet sounding cough, wheezing and shortness of breath.  Did 2 nebulizer treatments prior to arrival.  Also states she has not been able to eat or drink anything for the past 3 days.  Denies chest pain, abdominal pain, nausea, vomiting or diarrhea.   ED work-up/course:  59 year old female with COPD on nasal cannula O2 presenting with subjective fever, cough, wheezing and shortness of breath. Differential includes, but is not limited to, viral syndrome, bronchitis including COPD exacerbation, pneumonia, reactive airway disease including asthma, CHF including exacerbation with or without pulmonary/interstitial edema, pneumothorax, ACS, thoracic trauma, and pulmonary embolism.  Laboratory results demonstrate mild leukocytosis, elevated troponin which is likely stress leak.  Will administer DuoNeb which will make patient's fourth nebulizer treatment tonight, 125 mg Solu-Medrol.  Will discuss with hospitalist services for admission.  Review of Systems: Positives marked in 'bold' Constitutional: Denies fever, chills, diaphoresis, poor appetite and fatigue.  HEENT: Denies photophobia, eye pain, redness, hearing loss, ear pain, congestion, sore throat, rhinorrhea, sneezing, mouth sores, trouble swallowing, neck pain, neck stiffness and tinnitus.   Respiratory: Denies  SOB, DOE, cough, chest tightness,  and wheezing.   Cardiovascular: Denies chest pain, palpitations and leg swelling.  Gastrointestinal: Denies nausea, vomiting, abdominal pain, diarrhea, constipation, blood in stool and abdominal distention.  Genitourinary: Denies dysuria, urgency, frequency, hematuria, flank pain and difficulty urinating.  Musculoskeletal: Denies myalgias, back pain, joint swelling, arthralgias and gait problem.  Skin: Denies pallor, rash and wound.  Neurological: Denies dizziness, seizures, syncope, weakness, light-headedness, numbness and headaches.  Hematological: Denies adenopathy. Easy bruising, personal or family bleeding history  Psychiatric/Behavioral: Denies suicidal ideation, mood changes, confusion, nervousness, sleep disturbance and agitation  Past Medical History: Past Medical History:  Diagnosis Date  . Allergy   . Anxiety   . Colitis    recurrent  . COPD (chronic obstructive pulmonary disease) (HCC)   . Depression   . Graves disease    s/p iodine radiation treatment - euthyroid for many years per pt  . Hyperlipidemia   . Hypertension   . Loss of eye, LEFT 2007    Past Surgical History:  Procedure Laterality Date  . ABDOMINAL HYSTERECTOMY    . FRACTURE SURGERY      Medications: Prior to Admission medications   Medication Sig Start Date End Date Taking? Authorizing Provider  albuterol (PROVENTIL) (2.5 MG/3ML) 0.083% nebulizer solution Take 3 mLs (2.5 mg total) by nebulization 4 (four) times daily. 11/13/18   Galen Manila, NP  albuterol (VENTOLIN HFA) 108 (90 Base) MCG/ACT inhaler INHALE 2  PUFFS INTO THE LUNGS EVERY 6 HOURS AS NEEDED FOR WHEEZING OR SHORTNESS OF BREATH 05/18/19   Malfi, Jodelle Gross, FNP  amLODipine (NORVASC) 5 MG tablet Take 1 tablet (5 mg total) by mouth daily. 05/27/19   Malfi, Jodelle Gross, FNP  aspirin EC 81 MG tablet Take 81 mg by mouth daily.    [provider]  atorvastatin (LIPITOR) 40 MG tablet Take 1 tablet (40 mg  total) by mouth daily. 05/27/19   Malfi, Jodelle Gross, FNP  budesonide (PULMICORT) 0.5 MG/2ML nebulizer solution Take 2 mLs (0.5 mg total) by nebulization 2 (two) times daily. 05/27/19 05/26/20  Malfi, Jodelle Gross, FNP  cetirizine (ZYRTEC) 10 MG tablet Take 1 tablet (10 mg total) by mouth daily. 05/27/19   Malfi, Jodelle Gross, FNP  cyclobenzaprine (FLEXERIL) 10 MG tablet TAKE 1 TABLET BY MOUTH 2 TIMES DAILY AS NEEDED FOR MUSCLE SPASMS 05/27/19   Malfi, Jodelle Gross, FNP  formoterol (PERFOROMIST) 20 MCG/2ML nebulizer solution Take 2 mLs (20 mcg total) by nebulization 2 (two) times daily. 08/01/18   Erin Fulling, MD  guaiFENesin (MUCINEX) 600 MG 12 hr tablet Take 2 tablets (1,200 mg total) by mouth 2 (two) times daily as needed for cough or to loosen phlegm. 05/27/19   Malfi, Jodelle Gross, FNP  hydrOXYzine (ATARAX/VISTARIL) 10 MG tablet TAKE 1 TO 2 TABLETS(10 TO 20 MG) BY MOUTH THREE TIMES DAILY AS NEEDED FOR ANXIETY OR INSOMNIA 05/27/19   Malfi, Jodelle Gross, FNP  Multiple Vitamin (MULTIVITAMIN WITH MINERALS) TABS tablet Take 1 tablet by mouth daily.    [provider]  omeprazole (PRILOSEC OTC) 20 MG tablet Take 1 tablet (20 mg total) by mouth daily. 05/27/19   Malfi, Jodelle Gross, FNP  OXYGEN Inhale 2.5 L into the lungs.    [provider]  sertraline (ZOLOFT) 100 MG tablet Take 1 tablet (100 mg total) by mouth daily. 05/27/19   Malfi, Jodelle Gross, FNP    Allergies:   Allergies  Allergen Reactions  . Codeine Hives and Nausea And Vomiting  . Shellfish Allergy Hives  . Penicillins Hives and Rash    Social History:  reports that she has quit smoking. Her smoking use included cigarettes. She smoked 0.10 packs per day. She has never used smokeless tobacco. She reports that she does not drink alcohol or use drugs.  Family History: Family History  Problem Relation Age of Onset  . Heart disease Father   . Alcohol abuse Father   . Alcohol abuse Brother   . Heart failure Sister   . Celiac disease Sister   . Healthy Sister       Physical Exam: Blood pressure 136/86, pulse (!) 128, temperature 97.8 F (36.6 C), temperature source Oral, resp. rate (!) 24, height 5\' 6"  (1.676 m), weight 59 kg, SpO2 93 %. General: Alert, awake, oriented x3, in no acute distress. Eyes: pink conjunctiva,anicteric sclera, loss of left eye  HEENT: normocephalic, atraumatic, oropharynx clear Neck: supple, no masses or lymphadenopathy, no goiter, no bruits, no JVD CVS: Tachycardic rate and regular rhythm, without murmurs, rubs or gallops. No lower extremity edema Resp : Poor air movement throughout left>right. Wheezing present. Tachypnea. SOB when speaking in full sentences.  GI : Soft, nontender, nondistended, positive bowel sounds, no masses. No hepatomegaly. No hernia.  Musculoskeletal: No clubbing or cyanosis, positive pedal pulses. No contracture. ROM intact  Neuro: Grossly intact, no focal neurological deficits, strength 5/5 upper and lower extremities bilaterally Psych: alert and oriented x 3, normal mood and affect  Skin: no rashes or lesions, warm and dry   LABS on Admission: I have personally reviewed all the labs and imagings below    Basic Metabolic Panel: Recent Labs  Lab 06/21/19 2323  NA 134*  K 3.9  CL 83*  CO2 39*  GLUCOSE 126*  BUN 16  CREATININE 0.74  CALCIUM 9.8   Liver Function Tests: No results for input(s): AST, ALT, ALKPHOS, BILITOT, PROT, ALBUMIN in the last 168 hours. No results for input(s): LIPASE, AMYLASE in the last 168 hours. No results for input(s): AMMONIA in the last 168 hours. CBC: Recent Labs  Lab 06/21/19 2323  WBC 13.7*  NEUTROABS 11.5*  HGB 13.8  HCT 43.3  MCV 93.5  PLT 272   Cardiac Enzymes: No results for input(s): CKTOTAL, CKMB, CKMBINDEX, TROPONINI in the last 168 hours. BNP: Invalid input(s): POCBNP CBG: No results for input(s): GLUCAP in the last 168 hours.  Radiological Exams on Admission:  DG Chest 2 View  Result Date: 06/22/2019 CLINICAL DATA:  Shortness of  breath. EXAM: CHEST - 2 VIEW COMPARISON:  Radiograph 12/06/2017 FINDINGS: Large left pneumothorax with near complete collapse of the left lung. No evidence of tension or mediastinal shift. The left lung is atelectatic. Hyperinflated right lung with emphysema. No pleural fluid or focal airspace disease. Heart is normal in size. No acute osseous abnormalities are seen. IMPRESSION: Large left pneumothorax with near complete collapse of the left lung. No evidence of tension. Critical Value/emergent results were called by telephone at the time of interpretation on 06/22/2019 at 12:33 am to Dr Luvenia Starch SUNG , who verbally acknowledged these results. Electronically Signed   By: Keith Rake M.D.   On: 06/22/2019 00:33      EKG: Independently reviewed. Sinus tachycardia. No acute ST segment changes.    Assessment/Plan Active Problems:   Chronic bronchitis with COPD (chronic obstructive pulmonary disease) (HCC)   Anxiety and depression   Elevated lipids   Essential hypertension   Pulmonary emphysema (HCC)   COPD exacerbation (HCC)   Chronic respiratory failure (HCC)   Elevated troponin   Pneumothorax on left  Acute on COPD with Chronic Respiratory Failure and Left Pneumothorax Received Solumedrol 125 mg and Duonebs in ED. Requiring 4L O2 to maintain saturations in low to mid 90s. CXR showed large left pneumothorax with near collapse of lung. ED provider placed chest tube.  -admit to progressive care unit with telemetry and continuous pulse ox -supplemental O2 by [place] to keep O2 sats >92%  -continue steroid therapy with PO prednisone  - avoid albuterol due to tachycardia; breathing treatments with ipratropium and xopenox prn  -continue Pulmicort and Formoterol  -obtain sputum gram stain and culture  -start empiric therapy with Levaquin  -ED provider made surgery team aware of chest tube placement   Subjective Fevers Reports fevers at home. Afebrile in ED.  -follow blood cultures and lactic acid  ordered by ED provider   Elevated Troponin  Troponin I elevated to 70. Most likely related to demand ischemia. EKG without acute ST segment changes.  -trend troponins  -repeat EKG in AM   HTN  BP stable at admit.  -Amlodipine 5 mg   HLD  -continue ASA and statin therapy   Anxiety/Depression  -continue Sertraline 100 mg   DVT prophylaxis: Lovenox   CODE STATUS: FULL   Consults called:   Family Communication: Admission, patients condition and plan of care including tests being ordered have been discussed with the patient who indicates understanding and agree with  the plan and Code Status  Admission status: Inpatient   The medical decision making on this patient was of high complexity and the patient is at high risk for clinical deterioration, therefore this is a level 3 admission.  Severity of Illness:     Severe  The appropriate patient status for this patient is INPATIENT. Inpatient status is judged to be reasonable and necessary in order to provide the required intensity of service to ensure the patient's safety. The patient's presenting symptoms, physical exam findings, and initial radiographic and laboratory data in the context of their chronic comorbidities is felt to place them at high risk for further clinical deterioration. Furthermore, it is not anticipated that the patient will be medically stable for discharge from the hospital within 2 midnights of admission. The following factors support the patient status of inpatient.   " The patient's presenting symptoms include SOB, cough with sputum production, need for increased home O2, fevers/chills. " The worrisome physical exam findings include tachycardia, diminished air movement, tachypnea, wheezes. " The initial radiographic and laboratory data are worrisome because of CXR with left pneumothorax, troponin 70. " The chronic co-morbidities include COPD, HTN, HLD, depression.   * I certify that at the point of admission it  is my clinical judgment that the patient will require inpatient hospital care spanning beyond 2 midnights from the point of admission due to high intensity of service, high risk for further deterioration and high frequency of surveillance required.*    Time Spent on Admission: 46 minutes      De Hollingshead D.O.  [facility] 06/22/2019, 12:41 AM

## 2019-06-22 NOTE — ED Provider Notes (Signed)
[facility] Emergency Department Provider Note   ____________________________________________   First MD Initiated Contact with Patient 06/22/19 0007     (approximate)  I have reviewed the triage vital signs and the nursing notes.   HISTORY  Chief Complaint Shortness of Breath    HPI Alicia Gibson is a 59 y.o. female who presents to the ED from home with a chief complaint of shortness of breath.  Patient has a history of COPD on 2.5 L nasal cannula oxygen.  For the past several days she has had to increase her oxygen to 4 L.  Endorses subjective fever, wet sounding cough, wheezing and shortness of breath.  Did 2 nebulizer treatments prior to arrival.  Also states she has not been able to eat or drink anything for the past 3 days.  Denies chest pain, abdominal pain, nausea, vomiting or diarrhea.       Past Medical History:  Diagnosis Date  . Allergy   . Anxiety   . Colitis    recurrent  . COPD (chronic obstructive pulmonary disease) (HCC)   . Depression   . Graves disease    s/p iodine radiation treatment - euthyroid for many years per pt  . Hyperlipidemia   . Hypertension   . Loss of eye, LEFT 2007    Patient Active Problem List   Diagnosis Date Noted  . COPD exacerbation (HCC) 06/22/2019  . Chronic respiratory failure (HCC) 06/22/2019  . Elevated troponin 06/22/2019  . Pneumothorax on left 06/22/2019  . Chronic bronchitis with COPD (chronic obstructive pulmonary disease) (HCC) 05/14/2018  . Anxiety and depression 05/14/2018  . Elevated lipids 05/14/2018  . Seasonal allergic rhinitis due to pollen 05/14/2018  . Essential hypertension 05/14/2018  . Pulmonary emphysema (HCC) 06/23/2015    Past Surgical History:  Procedure Laterality Date  . ABDOMINAL HYSTERECTOMY    . FRACTURE SURGERY      Prior to Admission medications   Medication Sig Start Date End Date Taking? Authorizing Provider  albuterol (PROVENTIL) (2.5 MG/3ML) 0.083%  nebulizer solution Take 3 mLs (2.5 mg total) by nebulization 4 (four) times daily. 11/13/18  Yes Galen Manila, NP  albuterol (VENTOLIN HFA) 108 (90 Base) MCG/ACT inhaler INHALE 2 PUFFS INTO THE LUNGS EVERY 6 HOURS AS NEEDED FOR WHEEZING OR SHORTNESS OF BREATH Patient taking differently: Inhale 2 puffs into the lungs every 6 (six) hours as needed for wheezing or shortness of breath.  05/18/19  Yes Malfi, Jodelle Gross, FNP  amLODipine (NORVASC) 5 MG tablet Take 1 tablet (5 mg total) by mouth daily. 05/27/19  Yes Malfi, Jodelle Gross, FNP  aspirin EC 81 MG tablet Take 81 mg by mouth daily.   Yes [provider]  atorvastatin (LIPITOR) 40 MG tablet Take 1 tablet (40 mg total) by mouth daily. 05/27/19  Yes Malfi, Jodelle Gross, FNP  budesonide (PULMICORT) 0.5 MG/2ML nebulizer solution Take 2 mLs (0.5 mg total) by nebulization 2 (two) times daily. 05/27/19 05/26/20 Yes Malfi, Jodelle Gross, FNP  cetirizine (ZYRTEC) 10 MG tablet Take 1 tablet (10 mg total) by mouth daily. 05/27/19  Yes Malfi, Jodelle Gross, FNP  cyclobenzaprine (FLEXERIL) 10 MG tablet TAKE 1 TABLET BY MOUTH 2 TIMES DAILY AS NEEDED FOR MUSCLE SPASMS Patient taking differently: Take 10 mg by mouth 2 (two) times daily as needed for muscle spasms.  05/27/19  Yes Malfi, Jodelle Gross, FNP  formoterol (PERFOROMIST) 20 MCG/2ML nebulizer solution Take 2 mLs (20 mcg total) by nebulization 2 (two) times daily. 08/01/18  Yes Erin Fulling, MD  guaiFENesin (MUCINEX) 600 MG 12 hr tablet Take 2 tablets (1,200 mg total) by mouth 2 (two) times daily as needed for cough or to loosen phlegm. 05/27/19  Yes Malfi, Jodelle Gross, FNP  hydrOXYzine (ATARAX/VISTARIL) 10 MG tablet TAKE 1 TO 2 TABLETS(10 TO 20 MG) BY MOUTH THREE TIMES DAILY AS NEEDED FOR ANXIETY OR INSOMNIA Patient taking differently: Take 10-20 mg by mouth 3 (three) times daily as needed for anxiety (insomnia).  05/27/19  Yes Malfi, Jodelle Gross, FNP  Multiple Vitamin (MULTIVITAMIN WITH MINERALS) TABS tablet Take 1 tablet by mouth daily.    Yes [provider]  omeprazole (PRILOSEC OTC) 20 MG tablet Take 1 tablet (20 mg total) by mouth daily. 05/27/19  Yes Malfi, Jodelle Gross, FNP  omeprazole (PRILOSEC) 20 MG capsule Take 20 mg by mouth daily. 05/27/19  Yes [provider]  OXYGEN Inhale 3 L into the lungs continuous.    Yes [provider]  sertraline (ZOLOFT) 100 MG tablet Take 1 tablet (100 mg total) by mouth daily. 05/27/19  Yes Malfi, Jodelle Gross, FNP    Allergies Codeine, Shellfish allergy, and Penicillins  Family History  Problem Relation Age of Onset  . Heart disease Father   . Alcohol abuse Father   . Alcohol abuse Brother   . Heart failure Sister   . Celiac disease Sister   . Healthy Sister     Social History Social History   Tobacco Use  . Smoking status: Former Smoker    Packs/day: 0.10    Types: Cigarettes  . Smokeless tobacco: Never Used  Substance Use Topics  . Alcohol use: Never  . Drug use: Never    Review of Systems  Constitutional: Positive for subjective fever. Eyes: No visual changes. ENT: No sore throat. Cardiovascular: Denies chest pain. Respiratory: Positive for cough, wheezing and shortness of breath. Gastrointestinal: No abdominal pain.  No nausea, no vomiting.  No diarrhea.  No constipation. Genitourinary: Negative for dysuria. Musculoskeletal: Negative for back pain. Skin: Negative for rash. Neurological: Negative for headaches, focal weakness or numbness.   ____________________________________________   PHYSICAL EXAM:  VITAL SIGNS: ED Triage Vitals  Enc Vitals Group     BP 06/21/19 2317 136/86     Pulse Rate 06/21/19 2317 (!) 128     Resp 06/21/19 2317 (!) 24     Temp 06/21/19 2317 97.8 F (36.6 C)     Temp Source 06/21/19 2317 Oral     SpO2 06/21/19 2317 93 %     Weight 06/21/19 2319 130 lb (59 kg)     Height 06/21/19 2319 5\' 6"  (1.676 m)     Head Circumference --      Peak Flow --      Pain Score 06/21/19 2319 9     Pain Loc --      Pain  Edu? --      Excl. in GC? --     Constitutional: Alert and oriented.  Chronically ill appearing and in mild to moderate acute distress. Eyes: Conjunctivae are normal. PERRL. EOMI. Head: Atraumatic. Nose: No congestion/rhinnorhea. Mouth/Throat: Mucous membranes are moist.  Oropharynx non-erythematous. Neck: No stridor.   Cardiovascular: Tachycardic rate, regular rhythm. Grossly normal heart sounds.  Good peripheral circulation. Respiratory: Increased respiratory effort.  Retractions. Lungs diminished bilaterally left>right with mild wheezing. Gastrointestinal: Soft and nontender. No distention. No abdominal bruits. No CVA tenderness. Musculoskeletal: No lower extremity tenderness nor edema.  No joint effusions. Neurologic:  Normal speech and  language. No gross focal neurologic deficits are appreciated.  Skin:  Skin is warm, dry and intact. No rash noted. Psychiatric: Mood and affect are normal. Speech and behavior are normal.  ____________________________________________   LABS (all labs ordered are listed, but only abnormal results are displayed)  Labs Reviewed  CBC WITH DIFFERENTIAL/PLATELET - Abnormal; Notable for the following components:      Result Value   WBC 13.7 (*)    Neutro Abs 11.5 (*)    Monocytes Absolute 1.1 (*)    All other components within normal limits  BASIC METABOLIC PANEL - Abnormal; Notable for the following components:   Sodium 134 (*)    Chloride 83 (*)    CO2 39 (*)    Glucose, Bld 126 (*)    All other components within normal limits  TROPONIN I (HIGH SENSITIVITY) - Abnormal; Notable for the following components:   Troponin I (High Sensitivity) 70 (*)    All other components within normal limits  TROPONIN I (HIGH SENSITIVITY) - Abnormal; Notable for the following components:   Troponin I (High Sensitivity) 60 (*)    All other components within normal limits  SARS CORONAVIRUS 2 BY RT PCR (HOSPITAL ORDER, PERFORMED IN [facility])    CULTURE, BLOOD (ROUTINE X 2)  CULTURE, BLOOD (ROUTINE X 2)  LACTIC ACID, PLASMA   ____________________________________________  EKG  ED ECG REPORT I, Onofrio Klemp J, the attending physician, personally viewed and interpreted this ECG.   Date: 06/22/2019  EKG Time: 2331  Rate: 125  Rhythm: sinus tachycardia  Axis: Normal  Intervals:none  ST&T Change: Nonspecific  ____________________________________________  RADIOLOGY  ED MD interpretation: Large left-sided pneumothorax; repeat chest x-ray demonstrates lung reinflation with pigtail catheter  Official radiology report(s): DG Chest 2 View  Result Date: 06/22/2019 CLINICAL DATA:  Shortness of breath. EXAM: CHEST - 2 VIEW COMPARISON:  Radiograph 12/06/2017 FINDINGS: Large left pneumothorax with near complete collapse of the left lung. No evidence of tension or mediastinal shift. The left lung is atelectatic. Hyperinflated right lung with emphysema. No pleural fluid or focal airspace disease. Heart is normal in size. No acute osseous abnormalities are seen. IMPRESSION: Large left pneumothorax with near complete collapse of the left lung. No evidence of tension. Critical Value/emergent results were called by telephone at the time of interpretation on 06/22/2019 at 12:33 am to Dr Lesly Rubenstein Rion Schnitzer , who verbally acknowledged these results. Electronically Signed   By: Narda Rutherford M.D.   On: 06/22/2019 00:33   DG Chest Portable 1 View  Result Date: 06/22/2019 CLINICAL DATA:  Chest tube placement EXAM: PORTABLE CHEST 1 VIEW COMPARISON:  Jun 22, 2019 FINDINGS: The heart size and mediastinal contours are within normal limits. There is interval placement of a left-sided pigtail catheter with re-expansion of the left lung. No definite residual pneumothorax is seen. Bilateral coarsened interstitial markings are seen throughout both lungs with hyperinflation of the upper lung zones. No pleural effusion. IMPRESSION: Interval placement of left-sided pigtail  catheter with no residual pneumothorax seen. Electronically Signed   By: Jonna Clark M.D.   On: 06/22/2019 03:31    ____________________________________________   PROCEDURES  Procedure(s) performed (including Critical Care):  .1-3 Lead EKG Interpretation Performed by: Irean Hong, MD Authorized by: Irean Hong, MD     Interpretation: abnormal     ECG rate:  120   ECG rate assessment: tachycardic     Rhythm: sinus tachycardia     Ectopy: none     Conduction:  normal   Comments:     Patient placed on cardiac monitor to monitor for arrhythmias    CRITICAL CARE Performed by: Paulette Blanch   Total critical care time: 45 minutes  Critical care time was exclusive of separately billable procedures and treating other patients.  Critical care was necessary to treat or prevent imminent or life-threatening deterioration.  Critical care was time spent personally by me on the following activities: development of treatment plan with patient and/or surrogate as well as nursing, discussions with consultants, evaluation of patient's response to treatment, examination of patient, obtaining history from patient or surrogate, ordering and performing treatments and interventions, ordering and review of laboratory studies, ordering and review of radiographic studies, pulse oximetry and re-evaluation of patient's condition.  ____________________________________________   INITIAL IMPRESSION / ASSESSMENT AND PLAN / ED COURSE  As part of my medical decision making, I reviewed the following data within the [facility] notes reviewed and incorporated, Labs reviewed, EKG interpreted, Old chart reviewed, Radiograph reviewed, Discussed with admitting physician and Notes from prior ED visits     Aminah Hagans was evaluated in Emergency Department on 06/22/2019 for the symptoms described in the history of present illness. She was evaluated in the context of the global COVID-19  pandemic, which necessitated consideration that the patient might be at risk for infection with the SARS-CoV-2 virus that causes COVID-19. Institutional protocols and algorithms that pertain to the evaluation of patients at risk for COVID-19 are in a state of rapid change based on information released by regulatory bodies including the CDC and federal and state organizations. These policies and algorithms were followed during the patient's care in the ED.    59 year old female with COPD on nasal cannula O2 presenting with subjective fever, cough, wheezing and shortness of breath. Differential includes, but is not limited to, viral syndrome, bronchitis including COPD exacerbation, pneumonia, reactive airway disease including asthma, CHF including exacerbation with or without pulmonary/interstitial edema, pneumothorax, ACS, thoracic trauma, and pulmonary embolism.  Laboratory results demonstrate mild leukocytosis, elevated troponin which is likely stress leak.  Will administer DuoNeb which will make patient's fourth nebulizer treatment tonight, 125 mg Solu-Medrol.  Will discuss with hospitalist services for admission.   Clinical Course as of Jun 21 508  Sat Jun 22, 2019  0119 Spoke with surgery Dr. Hampton Abbot who will place chest tube for large left-sided pneumothorax.   [JS]  0302 Chest tube placed by Dr. Hampton Abbot.  Will obtain postprocedural chest x-ray.   [JS]    Clinical Course User Index [JS] Paulette Blanch, MD     ____________________________________________   FINAL CLINICAL IMPRESSION(S) / ED DIAGNOSES  Final diagnoses:  COPD exacerbation ([place])  Elevated troponin  Pneumothorax, unspecified type     ED Discharge Orders    None       Note:  This document was prepared using Dragon voice recognition software and may include unintentional dictation errors.   Paulette Blanch, MD 06/22/19 [phone]

## 2019-06-22 NOTE — ED Notes (Addendum)
Pt in with inc WOB, accessory muscle use, says has been hurting in chest for a month but got really bad about three days ago. States stopped eating bc of it. Pt A&Ox4. EDP Dolores Frame denied need for repeat EKG.

## 2019-06-22 NOTE — ED Notes (Signed)
Attempted to call report. Floor will not accept due to tachycardia, tachypnea, and droplet precautions. Will contact provider to review.

## 2019-06-22 NOTE — Op Note (Signed)
  Procedure Date:  06/22/2019  Pre-operative Diagnosis:  Left Pneumothorax  Post-operative Diagnosis:  Left Pneumothorax  Procedure:  Left chest tube placement  Surgeon:  Howie Ill, MD  Anesthesia:  15 ml of 1% lidocaine  Estimated Blood Loss:  5 ml  Specimens:  None  Complications:  None  Indications for Procedure:  This is a 59 y.o. female with diagnosis of left pneumothorax, requiring chest tube placement.  The risks of bleeding, abscess or infection, injury to surrounding structures, and need for further procedures were all discussed with the patient and was willing to proceed.  Description of Procedure: The patient was correctly identified at bedside.  Appropriate time-outs were performed prior to procedure.  The patient's left chest was prepped and draped in usual sterile fashion.  Local anesthetic was infused intradermally at the 5th intercostal space, at the level of the midaxillary line.  A 14 gauge needle was used to enter the left chest cavity, going above the rib to avoid the neurovascular bundle.  There was air flow through the needle and syringe, confirming entry to the pneumothorax space.  Seldinger technique was then used, with guide wire, dilator, and 14 Fr chest tube.  The chest tube was then secured to the skin using 3-0 Silk suture and then connected to the pleuravac.  An occlussive dressing was placed using vaseline gauze, dry gauze, and tegaderm.  The distal end of the tube was taped to the patient's skin with tape.  Chest x-ray was performed, showing an expanded left lung with chest tube in good position.  The patient tolerated the procedure well and all sharps were appropriately disposed of at the end of the case.   Howie Ill, MD

## 2019-06-22 NOTE — ED Notes (Signed)
Pt leaving for xray. Will collect blood cultures and all once pt back to room.

## 2019-06-22 NOTE — ED Notes (Signed)
Pt given warm blankets. Bed locked low. Rails up. Call bell within reach. Pt agrees to notify this RN of any changes in condition or needs by using call bell.

## 2019-06-22 NOTE — ED Notes (Signed)
Chest tube to int suction; set at 20; bubbles initially noted in water seal.

## 2019-06-22 NOTE — Progress Notes (Signed)
06/22/2019  Subjective: Patient is s/p left chest tube placement overnight for a large left pneumothorax.  No acute events.  Patient reports the pain is better controlled with the different medications although she still having tenderness.  Denies any worsening shortness of breath.  Chest x-ray this morning shows continuously expanded left lung.  Vital signs: Temp:  [97.8 F (36.6 C)] 97.8 F (36.6 C) (05/28 2317) Pulse Rate:  [104-129] 108 (05/29 0830) Resp:  [13-31] 24 (05/29 0830) BP: (120-142)/(72-89) 137/89 (05/29 0830) SpO2:  [91 %-100 %] 92 % (05/29 0830) Weight:  [59 kg] 59 kg (05/28 2319)   Intake/Output: No intake/output data recorded.    Physical Exam: Constitutional: No acute distress Pulm: Left chest tube in place with no air leak on Pleur-evac.  Dressing intact.   Labs:  Recent Labs    06/21/19 2323 06/22/19 0924  WBC 13.7* 11.3*  HGB 13.8 12.7  HCT 43.3 40.6  PLT 272 220   Recent Labs    06/21/19 2323 06/22/19 0924  NA 134* 135  K 3.9 4.5  CL 83* 84*  CO2 39* 40*  GLUCOSE 126* 200*  BUN 16 21*  CREATININE 0.74 0.79  CALCIUM 9.8 9.1   No results for input(s): LABPROT, INR in the last 72 hours.  Imaging: DG Chest 2 View  Result Date: 06/22/2019 CLINICAL DATA:  Shortness of breath. EXAM: CHEST - 2 VIEW COMPARISON:  Radiograph 12/06/2017 FINDINGS: Large left pneumothorax with near complete collapse of the left lung. No evidence of tension or mediastinal shift. The left lung is atelectatic. Hyperinflated right lung with emphysema. No pleural fluid or focal airspace disease. Heart is normal in size. No acute osseous abnormalities are seen. IMPRESSION: Large left pneumothorax with near complete collapse of the left lung. No evidence of tension. Critical Value/emergent results were called by telephone at the time of interpretation on 06/22/2019 at 12:33 am to Dr Lesly Rubenstein SUNG , who verbally acknowledged these results. Electronically Signed   By: Narda Rutherford  M.D.   On: 06/22/2019 00:33   DG Chest Portable 1 View  Result Date: 06/22/2019 CLINICAL DATA:  Chest tube placement EXAM: PORTABLE CHEST 1 VIEW COMPARISON:  Jun 22, 2019 FINDINGS: The heart size and mediastinal contours are within normal limits. There is interval placement of a left-sided pigtail catheter with re-expansion of the left lung. No definite residual pneumothorax is seen. Bilateral coarsened interstitial markings are seen throughout both lungs with hyperinflation of the upper lung zones. No pleural effusion. IMPRESSION: Interval placement of left-sided pigtail catheter with no residual pneumothorax seen. Electronically Signed   By: Jonna Clark M.D.   On: 06/22/2019 03:31    Assessment/Plan: This is a 59 y.o. female s/p left chest tube placement for left-sided pneumothorax.  -Continue chest tube to suction for total of 48 hours. -Chest tube may be placed to waterseal temporarily for ambulation only. -Repeat chest x-ray in the morning.   Howie Ill, MD [facility]

## 2019-06-22 NOTE — Progress Notes (Signed)
Physical Therapy Evaluation Patient Details Name: Alicia Gibson MRN: [id number] DOB: 1960-02-18 Today's Date: 06/22/2019   History of Present Illness  Alicia Gibson is 59 y.o. female with PMH of COPD with chronic respiratory failure on 2.5L O2 at home, HTN, HLD who presents for worsening SOB and increasing O2 requirement. Has needed to use 4L O2 for past several days. Endorses subjective fever, wet sounding cough, wheezing and shortness of breath. Left pneumothorax found, now s/p Left chest tube placement 06/22/2019.  Clinical Impression  Patient agrees to PT evaluation. She reports 8/10 pain to left chest/lung.  She reports that she lives with a friend and ambulated without AD prior to admission. She has 3+/5 strength BLE hip flex and knee flex/ext. She needs min assist for bed mobility supine <> sit , min assist for transfers sit to stand with HHA. She has good sitting balance and fair standing balance. She is able to stand for 3 minutes with HHA before she fatigues and needs to rest. She will continue to benefit from skilled PT to improve mobility and strength.     Follow Up Recommendations Home health PT    Equipment Recommendations  Rolling walker with 5" wheels    Recommendations for Other Services       Precautions / Restrictions Precautions Precautions: Fall Restrictions Weight Bearing Restrictions: No Other Position/Activity Restrictions: L chest tube in place      Mobility  Bed Mobility Overal bed mobility: Needs Assistance Bed Mobility: Supine to Sit;Sit to Supine     Supine to sit: Supervision;HOB elevated Sit to supine: Supervision;HOB elevated   General bed mobility comments: Increased time suspect 2/2 pain from L chest tube  Transfers Overall transfer level: Needs assistance Equipment used: (HHA, belt,O2) Transfers: Sit to/from Stand Sit to Stand: Min guard;From elevated surface         General transfer comment: VC for safety  Ambulation/Gait                 Stairs            Wheelchair Mobility    Modified Rankin (Stroke Patients Only)       Balance Overall balance assessment: Needs assistance Sitting-balance support: Single extremity supported;Feet supported Sitting balance-Leahy Scale: Good     Standing balance support: No upper extremity supported;During functional activity Standing balance-Leahy Scale: Fair                               Pertinent Vitals/Pain Pain Assessment: 0-10 Pain Score: 8  Pain Location: (left chest) Pain Descriptors / Indicators: Aching;Discomfort Pain Intervention(s): Limited activity within patient's tolerance;Monitored during session    Home Living Family/patient expects to be discharged to:: Private residence Living Arrangements: Non-relatives/Friends(roommate (best friend)) Available Help at Discharge: Friend(s);Available 24 hours/day Type of Home: House Home Access: Ramped entrance;Stairs to enter   Entrance Stairs-Number of Steps: 4 Home Layout: One level Home Equipment: Grab bars - tub/shower      Prior Function Level of Independence: Independent with assistive device(s)         Comments: MOD I c home O2 for mobility and I/ADLs     Hand Dominance   Dominant Hand: Right    Extremity/Trunk Assessment   Upper Extremity Assessment Upper Extremity Assessment: Defer to OT evaluation    Lower Extremity Assessment Lower Extremity Assessment: RLE deficits/detail;LLE deficits/detail RLE Deficits / Details: (3+/5 hip flex, knee flex/ext 3+/5) LLE Deficits / Details: (3+/5 hip flex,  knee flex/ext 3+/5)       Communication   Communication: No difficulties  Cognition Arousal/Alertness: Awake/alert Behavior During Therapy: WFL for tasks assessed/performed Overall Cognitive Status: Within Functional Limits for tasks assessed                                 General Comments: Pt reports memory difficulties, not appreciated this date       General Comments General comments (skin integrity, edema, etc.): seated and standing O2 4 L 94%, HR 112    Exercises Other Exercises Other Exercises: Pt educated re: OT role, PLB, energy conservation strategies, DME recs, d/c recs, home/routines modifications, pain management, breathing techniques Other Exercises: Toileting, self-drinking, UBD, LBD, bed mobility, sup<>sit, sit<>stand x2, hand hygiene, PLB   Assessment/Plan    PT Assessment Patient needs continued PT services  PT Problem List Decreased strength;Decreased activity tolerance;Decreased balance;Decreased mobility       PT Treatment Interventions Gait training;Functional mobility training;Therapeutic activities;Therapeutic exercise    PT Goals (Current goals can be found in the Care Plan section)  Acute Rehab PT Goals Patient Stated Goal: to go home PT Goal Formulation: With patient Time For Goal Achievement: 07/06/19 Potential to Achieve Goals: Good    Frequency Min 2X/week   Barriers to discharge        Co-evaluation               AM-PAC PT "6 Clicks" Mobility  Outcome Measure Help needed turning from your back to your side while in a flat bed without using bedrails?: A Little Help needed moving from lying on your back to sitting on the side of a flat bed without using bedrails?: A Little Help needed moving to and from a bed to a chair (including a wheelchair)?: A Little Help needed standing up from a chair using your arms (e.g., wheelchair or bedside chair)?: A Little Help needed to walk in hospital room?: A Little Help needed climbing 3-5 steps with a railing? : A Little 6 Click Score: 18    End of Session   Activity Tolerance: Patient limited by pain Patient left: in bed Nurse Communication: Mobility status PT Visit Diagnosis: Unsteadiness on feet (R26.81);Muscle weakness (generalized) (M62.81);Difficulty in walking, not elsewhere classified (R26.2);Pain Pain - Right/Left: (left  chest)     Time: 9628-3662 PT Time Calculation (min) (ACUTE ONLY): 25 min   Charges:   PT Evaluation $PT Eval Low Complexity: 1 Low PT Treatments $Therapeutic Activity: 8-22 mins         Alanson Puls, PT DPT 06/22/2019, 2:34 PM

## 2019-06-22 NOTE — Consult Note (Signed)
Date of Consultation:  06/22/2019  Requesting Physician:  Chiquita Loth, MD  Reason for Consultation:  Left pneumothorax  History of Present Illness: Alicia Gibson is a 59 y.o. female with a history of COPD, on 2.5 L O2 at home, who presents with a 3 day history of worsening left sided chest pain, worsening breathing difficulty.  She had to increase her home O2 to 4L, and she reports that she's unable to really move or do much due to shortness of breath.  In the ED, CXR showed a large left pneumothorax.  Surgery was consulted for chest tube placement.  The patient reports this is the first time she has a pneumothorax and has not needed a chest tube in the past.  Past Medical History: Past Medical History:  Diagnosis Date  . Allergy   . Anxiety   . Colitis    recurrent  . COPD (chronic obstructive pulmonary disease) (HCC)   . Depression   . Graves disease    s/p iodine radiation treatment - euthyroid for many years per pt  . Hyperlipidemia   . Hypertension   . Loss of eye, LEFT 2007     Past Surgical History: Past Surgical History:  Procedure Laterality Date  . ABDOMINAL HYSTERECTOMY    . FRACTURE SURGERY      Home Medications: Prior to Admission medications   Medication Sig Start Date End Date Taking? Authorizing Provider  albuterol (PROVENTIL) (2.5 MG/3ML) 0.083% nebulizer solution Take 3 mLs (2.5 mg total) by nebulization 4 (four) times daily. 11/13/18  Yes Galen Manila, NP  albuterol (VENTOLIN HFA) 108 (90 Base) MCG/ACT inhaler INHALE 2 PUFFS INTO THE LUNGS EVERY 6 HOURS AS NEEDED FOR WHEEZING OR SHORTNESS OF BREATH Patient taking differently: Inhale 2 puffs into the lungs every 6 (six) hours as needed for wheezing or shortness of breath.  05/18/19  Yes Malfi, Jodelle Gross, FNP  amLODipine (NORVASC) 5 MG tablet Take 1 tablet (5 mg total) by mouth daily. 05/27/19  Yes Malfi, Jodelle Gross, FNP  aspirin EC 81 MG tablet Take 81 mg by mouth daily.   Yes [provider]   atorvastatin (LIPITOR) 40 MG tablet Take 1 tablet (40 mg total) by mouth daily. 05/27/19  Yes Malfi, Jodelle Gross, FNP  budesonide (PULMICORT) 0.5 MG/2ML nebulizer solution Take 2 mLs (0.5 mg total) by nebulization 2 (two) times daily. 05/27/19 05/26/20 Yes Malfi, Jodelle Gross, FNP  cetirizine (ZYRTEC) 10 MG tablet Take 1 tablet (10 mg total) by mouth daily. 05/27/19  Yes Malfi, Jodelle Gross, FNP  cyclobenzaprine (FLEXERIL) 10 MG tablet TAKE 1 TABLET BY MOUTH 2 TIMES DAILY AS NEEDED FOR MUSCLE SPASMS Patient taking differently: Take 10 mg by mouth 2 (two) times daily as needed for muscle spasms.  05/27/19  Yes Malfi, Jodelle Gross, FNP  formoterol (PERFOROMIST) 20 MCG/2ML nebulizer solution Take 2 mLs (20 mcg total) by nebulization 2 (two) times daily. 08/01/18  Yes Erin Fulling, MD  guaiFENesin (MUCINEX) 600 MG 12 hr tablet Take 2 tablets (1,200 mg total) by mouth 2 (two) times daily as needed for cough or to loosen phlegm. 05/27/19  Yes Malfi, Jodelle Gross, FNP  hydrOXYzine (ATARAX/VISTARIL) 10 MG tablet TAKE 1 TO 2 TABLETS(10 TO 20 MG) BY MOUTH THREE TIMES DAILY AS NEEDED FOR ANXIETY OR INSOMNIA Patient taking differently: Take 10-20 mg by mouth 3 (three) times daily as needed for anxiety (insomnia).  05/27/19  Yes Malfi, Jodelle Gross, FNP  Multiple Vitamin (MULTIVITAMIN WITH MINERALS) TABS tablet Take 1  tablet by mouth daily.   Yes [provider]  omeprazole (PRILOSEC OTC) 20 MG tablet Take 1 tablet (20 mg total) by mouth daily. 05/27/19  Yes Malfi, Jodelle Gross, FNP  omeprazole (PRILOSEC) 20 MG capsule Take 20 mg by mouth daily. 05/27/19  Yes [provider]  OXYGEN Inhale 3 L into the lungs continuous.    Yes [provider]  sertraline (ZOLOFT) 100 MG tablet Take 1 tablet (100 mg total) by mouth daily. 05/27/19  Yes Malfi, Jodelle Gross, FNP    Allergies: Allergies  Allergen Reactions  . Codeine Hives and Nausea And Vomiting  . Shellfish Allergy Hives  . Penicillins Hives and Rash    Social History:  reports  that she has quit smoking. Her smoking use included cigarettes. She smoked 0.10 packs per day. She has never used smokeless tobacco. She reports that she does not drink alcohol or use drugs.   Family History: Family History  Problem Relation Age of Onset  . Heart disease Father   . Alcohol abuse Father   . Alcohol abuse Brother   . Heart failure Sister   . Celiac disease Sister   . Healthy Sister     Review of Systems: Review of Systems  Constitutional: Negative for chills and fever.  HENT: Negative for hearing loss.   Eyes: Negative for blurred vision.  Respiratory: Positive for shortness of breath.   Cardiovascular: Positive for chest pain.  Gastrointestinal: Negative for abdominal pain, nausea and vomiting.  Genitourinary: Negative for dysuria.  Musculoskeletal: Positive for back pain. Negative for myalgias.  Skin: Negative for rash.  Neurological: Negative for dizziness.  Psychiatric/Behavioral: Negative for depression.    Physical Exam BP 125/78 (BP Location: Right Arm)   Pulse (!) 119   Temp 97.8 F (36.6 C) (Oral)   Resp (!) 26   Ht 5\' 6"  (1.676 m)   Wt 59 kg   SpO2 93%   BMI 20.98 kg/m  CONSTITUTIONAL: No acute distress, on nasal canula HEENT:  Normocephalic, atraumatic, left eye absent. NECK: Trachea is midline, and there is no jugular venous distension. RESPIRATORY:  Absent lung sounds on the left. CARDIOVASCULAR: sinus tachycardia on monitor GI: The abdomen is soft, non-distended, non-tender.  MUSCULOSKELETAL:  Normal muscle strength and tone in all four extremities.  No peripheral edema or cyanosis. SKIN: Skin turgor is normal. There are no pathologic skin lesions.  NEUROLOGIC:  Motor and sensation is grossly normal.  Cranial nerves are grossly intact. PSYCH:  Alert and oriented to person, place and time. Affect is normal.  Laboratory Analysis: Results for orders placed or performed during the hospital encounter of 06/22/19 (from the past 24 hour(s))   CBC with Differential     Status: Abnormal   Collection Time: 06/21/19 11:23 PM  Result Value Ref Range   WBC 13.7 (H) 4.0 - 10.5 K/uL   RBC 4.63 3.87 - 5.11 MIL/uL   Hemoglobin 13.8 12.0 - 15.0 g/dL   HCT 06/23/19 01.6 - 01.0 %   MCV 93.5 80.0 - 100.0 fL   MCH 29.8 26.0 - 34.0 pg   MCHC 31.9 30.0 - 36.0 g/dL   RDW 93.2 35.5 - 73.2 %   Platelets 272 150 - 400 K/uL   nRBC 0.0 0.0 - 0.2 %   Neutrophils Relative % 83 %   Neutro Abs 11.5 (H) 1.7 - 7.7 K/uL   Lymphocytes Relative 7 %   Lymphs Abs 1.0 0.7 - 4.0 K/uL   Monocytes Relative 8 %  Monocytes Absolute 1.1 (H) 0.1 - 1.0 K/uL   Eosinophils Relative 1 %   Eosinophils Absolute 0.1 0.0 - 0.5 K/uL   Basophils Relative 0 %   Basophils Absolute 0.1 0.0 - 0.1 K/uL   Immature Granulocytes 1 %   Abs Immature Granulocytes 0.07 0.00 - 0.07 K/uL  Basic metabolic panel     Status: Abnormal   Collection Time: 06/21/19 11:23 PM  Result Value Ref Range   Sodium 134 (L) 135 - 145 mmol/L   Potassium 3.9 3.5 - 5.1 mmol/L   Chloride 83 (L) 98 - 111 mmol/L   CO2 39 (H) 22 - 32 mmol/L   Glucose, Bld 126 (H) 70 - 99 mg/dL   BUN 16 6 - 20 mg/dL   Creatinine, Ser 0.74 0.44 - 1.00 mg/dL   Calcium 9.8 8.9 - 10.3 mg/dL   GFR calc non Af Amer >60 >60 mL/min   GFR calc Af Amer >60 >60 mL/min   Anion gap 12 5 - 15  Troponin I (High Sensitivity)     Status: Abnormal   Collection Time: 06/21/19 11:23 PM  Result Value Ref Range   Troponin I (High Sensitivity) 70 (H) <18 ng/L  SARS Coronavirus 2 by RT PCR (hospital order, performed in [place] hospital lab) Nasopharyngeal Nasopharyngeal Swab     Status: None   Collection Time: 06/22/19 12:05 AM   Specimen: Nasopharyngeal Swab  Result Value Ref Range   SARS Coronavirus 2 NEGATIVE NEGATIVE  Lactic acid, plasma     Status: None   Collection Time: 06/22/19  1:00 AM  Result Value Ref Range   Lactic Acid, Venous 1.0 0.5 - 1.9 mmol/L  Troponin I (High Sensitivity)     Status: Abnormal   Collection  Time: 06/22/19  1:22 AM  Result Value Ref Range   Troponin I (High Sensitivity) 60 (H) <18 ng/L    Imaging: DG Chest 2 View  Result Date: 06/22/2019 CLINICAL DATA:  Shortness of breath. EXAM: CHEST - 2 VIEW COMPARISON:  Radiograph 12/06/2017 FINDINGS: Large left pneumothorax with near complete collapse of the left lung. No evidence of tension or mediastinal shift. The left lung is atelectatic. Hyperinflated right lung with emphysema. No pleural fluid or focal airspace disease. Heart is normal in size. No acute osseous abnormalities are seen. IMPRESSION: Large left pneumothorax with near complete collapse of the left lung. No evidence of tension. Critical Value/emergent results were called by telephone at the time of interpretation on 06/22/2019 at 12:33 am to Dr Luvenia Starch SUNG , who verbally acknowledged these results. Electronically Signed   By: Keith Rake M.D.   On: 06/22/2019 00:33   DG Chest Portable 1 View  Result Date: 06/22/2019 CLINICAL DATA:  Chest tube placement EXAM: PORTABLE CHEST 1 VIEW COMPARISON:  Jun 22, 2019 FINDINGS: The heart size and mediastinal contours are within normal limits. There is interval placement of a left-sided pigtail catheter with re-expansion of the left lung. No definite residual pneumothorax is seen. Bilateral coarsened interstitial markings are seen throughout both lungs with hyperinflation of the upper lung zones. No pleural effusion. IMPRESSION: Interval placement of left-sided pigtail catheter with no residual pneumothorax seen. Electronically Signed   By: Prudencio Pair M.D.   On: 06/22/2019 03:31    Assessment and Plan: This is a 59 y.o. female with large left pneumothorax.  --Discussed with the patient that we would need to place a chest tube to evacuate the air from within the chest cavity and allow the lung  to re-expand.  Discussed with her the risks of bleeding, infection, injury to surrounding structures.  Discussed that chest tube would remain in place  to suction for 48 hours, followed by trial of waterseal and then removal if lung remains expanded.   --Patient will be admitted to hospitalist team.  May have a diet.  Chest tube to suction to -20 cm H2O. --Will order appropriate pain control. --CXR in AM.  Face-to-face time spent with the patient and care providers was 80 minutes, with more than 50% of the time spent counseling, educating, and coordinating care of the patient.     Howie Ill, MD [facility] Pg:  [phone]

## 2019-06-22 NOTE — Evaluation (Signed)
Occupational Therapy Evaluation Patient Details Name: Alicia Gibson MRN: [id number] DOB: Apr 15, 1960 Today's Date: 06/22/2019    History of Present Illness Alicia Gibson is 59 y.o. female with PMH of COPD with chronic respiratory failure on 2.5L O2 at home, HTN, HLD who presents for worsening SOB and increasing O2 requirement. Has needed to use 4L O2 for past several days. Endorses subjective fever, wet sounding cough, wheezing and shortness of breath. Left pneumothorax found, now s/p Left chest tube placement 06/22/2019.   Clinical Impression   [name] was seen for OT evaluation this date. Prior to hospital admission, pt was MOD I for mobility and ADLs using 2.5L O2 at baseline. Pt lives with her roommate who is available to assist c all needs. Pt presents to acute OT demonstrating impaired ADL performance and functional mobility 2/2 decreased activity tolerance, functional strength/ROM/balance deficits, and pain. Upon arrival pt found reclined in stretcher, SpO2 94% on 4.5L [place] and reporting 8/10 pain in L chest - RN notified. Pt currently requires SBA + RW toileting including clothing management and perihygiene in standing. SBA don shoes seated EOB (avoided trunk flexion, able to slip on home shoes c heel support). CGA for UBD in standing - VCs to maintain single UE support on RW. Pt desat during in room mobility to mid 80s, increasing to high 80s c VC for breaks and PLB on 4.5 L SpO2. Momentary desat to 79% on RA during mobility (pt removed [place]), resolved to 90% c seated rest, PLB, and 5L [place] from O2 tank. Pt would benefit from skilled OT to address noted impairments and functional limitations (see below for any additional details) in order to maximize safety and independence while minimizing falls risk and caregiver burden. Upon hospital discharge, recommend HHOT to maximize pt safety and return to functional independence during meaningful occupations of daily life.     Follow Up Recommendations   Home health OT    Equipment Recommendations  3 in 1 bedside commode    Recommendations for Other Services       Precautions / Restrictions Precautions Precautions: Fall Restrictions Weight Bearing Restrictions: No Other Position/Activity Restrictions: L chest tube in place      Mobility Bed Mobility Overal bed mobility: Needs Assistance Bed Mobility: Supine to Sit;Sit to Supine     Supine to sit: Supervision;HOB elevated Sit to supine: Supervision;HOB elevated   General bed mobility comments: Increased time suspect 2/2 pain from L chest tube  Transfers Overall transfer level: Needs assistance Equipment used: Rolling walker (2 wheeled) Transfers: Sit to/from Stand Sit to Stand: Min guard;From elevated surface         General transfer comment: SBA + RW from elevated stretcher height    Balance Overall balance assessment: Needs assistance Sitting-balance support: Single extremity supported;Feet supported Sitting balance-Leahy Scale: Good     Standing balance support: No upper extremity supported;During functional activity Standing balance-Leahy Scale: Fair                             ADL either performed or assessed with clinical judgement   ADL Overall ADL's : Needs assistance/impaired                                       General ADL Comments: SBA toileting including clothing management and perihygiene in standing. SBA don shoes (slipped on) seated EOB. CGA  for UBD standing - VCs to maintain single UE support on RW     Vision Baseline Vision/History: (L eye deficits r/t prior fall injuring eye, eye patch PRN) Patient Visual Report: No change from baseline       Perception     Praxis      Pertinent Vitals/Pain Pain Assessment: 0-10 Pain Score: 8  Pain Location: L chest Pain Descriptors / Indicators: Aching;Grimacing;Guarding Pain Intervention(s): Limited activity within patient's tolerance;Patient requesting pain meds-RN  notified;Utilized relaxation techniques;Repositioned     Hand Dominance Right   Extremity/Trunk Assessment Upper Extremity Assessment Upper Extremity Assessment: Overall WFL for tasks assessed   Lower Extremity Assessment Lower Extremity Assessment: Overall WFL for tasks assessed(Limited trunk flexion 2/2 pain )       Communication Communication Communication: No difficulties   Cognition Arousal/Alertness: Awake/alert Behavior During Therapy: WFL for tasks assessed/performed Overall Cognitive Status: Within Functional Limits for tasks assessed                                 General Comments: Pt reports memory difficulties, not appreciated this date   General Comments  Seated EOB: SpO2 94% on 4.5L [place]. In room mobility: SpO2 mid 80s increasing to high 80s c VC for breaks and PLB on 4.5 L SpO2. Desat to 79% on RA during mobility (pt removed [place]), resolved c seated rest and 5L [place] (O2 tank) to 90%. End of session reclined in bed: SpO2 94% on 4.5L     Exercises Exercises: Other exercises Other Exercises Other Exercises: Pt educated re: OT role, PLB, energy conservation strategies, DME recs, d/c recs, home/routines modifications, pain management, breathing techniques Other Exercises: Toileting, self-drinking, UBD, LBD, bed mobility, sup<>sit, sit<>stand x2, hand hygiene, PLB   Shoulder Instructions      Home Living Family/patient expects to be discharged to:: Private residence Living Arrangements: Non-relatives/Friends(roommate (best friend)) Available Help at Discharge: Friend(s);Available 24 hours/day Type of Home: House Home Access: Ramped entrance;Stairs to enter Entrance Stairs-Number of Steps: 4   Home Layout: One level     Bathroom Shower/Tub: Chief Strategy Officer: Standard     Home Equipment: Grab bars - tub/shower          Prior Functioning/Environment Level of Independence: Independent with assistive device(s)         Comments: MOD I c home O2 for mobility and I/ADLs        OT Problem List: Decreased strength;Decreased range of motion;Impaired balance (sitting and/or standing);Impaired vision/perception;Decreased knowledge of use of DME or AE;Decreased activity tolerance      OT Treatment/Interventions: Self-care/ADL training;Therapeutic exercise;Energy conservation;DME and/or AE instruction;Therapeutic activities;Visual/perceptual remediation/compensation;Patient/family education;Balance training    OT Goals(Current goals can be found in the care plan section) Acute Rehab OT Goals Patient Stated Goal: To breathe easier OT Goal Formulation: With patient Time For Goal Achievement: 07/06/19 Potential to Achieve Goals: Good ADL Goals Pt Will Perform Lower Body Dressing: with set-up;sit to/from stand;with supervision(c LRAD & AE PRN) Pt Will Transfer to Toilet: ambulating;regular height toilet;with supervision(c LRAD PRN) Additional ADL Goal #1: Pt will Independnetly verbalize plan to implement x3 energy conservation strategies  OT Frequency: Min 1X/week   Barriers to D/C: Inaccessible home environment          Co-evaluation              AM-PAC OT "6 Clicks" Daily Activity     Outcome Measure Help from another person  eating meals?: None Help from another person taking care of personal grooming?: None Help from another person toileting, which includes using toliet, bedpan, or urinal?: A Little Help from another person bathing (including washing, rinsing, drying)?: A Little Help from another person to put on and taking off regular upper body clothing?: A Little Help from another person to put on and taking off regular lower body clothing?: A Little 6 Click Score: 20   End of Session Equipment Utilized During Treatment: Rolling walker;Oxygen(4.5 L [place]) Nurse Communication: Patient requests pain meds;Mobility status  Activity Tolerance: Patient tolerated treatment well Patient left: in  bed;with call bell/phone within reach  OT Visit Diagnosis: Unsteadiness on feet (R26.81);Other abnormalities of gait and mobility (R26.89)                Time: 2334-3568 OT Time Calculation (min): 37 min Charges:  OT General Charges $OT Visit: 1 Visit OT Evaluation $OT Eval Moderate Complexity: 1 Mod OT Treatments $Self Care/Home Management : 23-37 mins  Dessie Coma, M.S. OTR/L  06/22/19, 11:41 AM

## 2019-06-22 NOTE — ED Notes (Addendum)
Pt given phone to call family as personal phone having issues. EDP Sung given 2% lido with Epi as requested.

## 2019-06-23 ENCOUNTER — Inpatient Hospital Stay: Payer: Medicaid Other

## 2019-06-23 DIAGNOSIS — J9621 Acute and chronic respiratory failure with hypoxia: Secondary | ICD-10-CM | POA: Diagnosis present

## 2019-06-23 LAB — PHOSPHORUS: Phosphorus: 2.9 mg/dL (ref 2.5–4.6)

## 2019-06-23 LAB — MAGNESIUM: Magnesium: 2.1 mg/dL (ref 1.7–2.4)

## 2019-06-23 LAB — CBC WITH DIFFERENTIAL/PLATELET
Abs Immature Granulocytes: 0.05 10*3/uL (ref 0.00–0.07)
Basophils Absolute: 0 10*3/uL (ref 0.0–0.1)
Basophils Relative: 0 %
Eosinophils Absolute: 0.1 10*3/uL (ref 0.0–0.5)
Eosinophils Relative: 1 %
HCT: 36.6 % (ref 36.0–46.0)
Hemoglobin: 11.8 g/dL — ABNORMAL LOW (ref 12.0–15.0)
Immature Granulocytes: 1 %
Lymphocytes Relative: 10 %
Lymphs Abs: 1.1 10*3/uL (ref 0.7–4.0)
MCH: 29.4 pg (ref 26.0–34.0)
MCHC: 32.2 g/dL (ref 30.0–36.0)
MCV: 91.3 fL (ref 80.0–100.0)
Monocytes Absolute: 1 10*3/uL (ref 0.1–1.0)
Monocytes Relative: 9 %
Neutro Abs: 8.7 10*3/uL — ABNORMAL HIGH (ref 1.7–7.7)
Neutrophils Relative %: 79 %
Platelets: 229 10*3/uL (ref 150–400)
RBC: 4.01 MIL/uL (ref 3.87–5.11)
RDW: 12.7 % (ref 11.5–15.5)
WBC: 11 10*3/uL — ABNORMAL HIGH (ref 4.0–10.5)
nRBC: 0 % (ref 0.0–0.2)

## 2019-06-23 LAB — COMPREHENSIVE METABOLIC PANEL
ALT: 14 U/L (ref 0–44)
AST: 18 U/L (ref 15–41)
Albumin: 3.6 g/dL (ref 3.5–5.0)
Alkaline Phosphatase: 53 U/L (ref 38–126)
Anion gap: 13 (ref 5–15)
BUN: 23 mg/dL — ABNORMAL HIGH (ref 6–20)
CO2: 37 mmol/L — ABNORMAL HIGH (ref 22–32)
Calcium: 9.3 mg/dL (ref 8.9–10.3)
Chloride: 85 mmol/L — ABNORMAL LOW (ref 98–111)
Creatinine, Ser: 0.73 mg/dL (ref 0.44–1.00)
GFR calc Af Amer: >60 mL/min (ref >60)
GFR calc non Af Amer: >60 mL/min (ref >60)
Glucose, Bld: 102 mg/dL — ABNORMAL HIGH (ref 70–99)
Potassium: 3.7 mmol/L (ref 3.5–5.1)
Sodium: 135 mmol/L (ref 135–145)
Total Bilirubin: 0.8 mg/dL (ref 0.3–1.2)
Total Protein: 6.9 g/dL (ref 6.5–8.1)

## 2019-06-23 MED ORDER — AMLODIPINE BESYLATE 10 MG PO TABS
10.0000 mg | ORAL_TABLET | Freq: Every day | ORAL | Status: DC
  Administered 2019-06-24 – 2019-06-25 (×2): 10 mg via ORAL
  Filled 2019-06-23 (×2): qty 1

## 2019-06-23 MED ORDER — AMLODIPINE BESYLATE 5 MG PO TABS
5.0000 mg | ORAL_TABLET | Freq: Once | ORAL | Status: AC
  Administered 2019-06-23: 5 mg via ORAL
  Filled 2019-06-23: qty 1

## 2019-06-23 MED ORDER — ADULT MULTIVITAMIN W/MINERALS CH
1.0000 | ORAL_TABLET | Freq: Every day | ORAL | Status: DC
  Administered 2019-06-24 – 2019-06-25 (×2): 1 via ORAL
  Filled 2019-06-23 (×2): qty 1

## 2019-06-23 MED ORDER — ENSURE ENLIVE PO LIQD
237.0000 mL | Freq: Two times a day (BID) | ORAL | Status: DC
  Administered 2019-06-23 – 2019-06-25 (×3): 237 mL via ORAL

## 2019-06-23 NOTE — Progress Notes (Signed)
Initial Nutrition Assessment  RD working remotely.  DOCUMENTATION CODES:   Not applicable  INTERVENTION:   -Ensure Enlive po BID, each supplement provides 350 kcal and 20 grams of protein -MVI with minerals daily  NUTRITION DIAGNOSIS:   Increased nutrient needs related to post-op healing as evidenced by estimated needs.  GOAL:   Patient will meet greater than or equal to 90% of their needs  MONITOR:   PO intake, Supplement acceptance, Labs, Weight trends, Skin, I & O's  REASON FOR ASSESSMENT:   Consult Assessment of nutrition requirement/status  ASSESSMENT:   Alicia Gibson is a 59 y.o. female with a history of COPD, on 2.5 L O2 at home, who presents with a 3 day history of worsening left sided chest pain, worsening breathing difficulty.  She had to increase her home O2 to 4L, and she reports that she's unable to really move or do much due to shortness of breath.  In the ED, CXR showed a large left pneumothorax.  Surgery was consulted for chest tube placement.  Pt admitted with large lt pneumothorax.   5/29- s/p lt chest tube placement  Attempted to speak with pt via phone, however, no answer.   Per general surgery notes, plan chest tube suction for another 48 hours.   Pt currently on a heart healthy diet, however, no meal completion records available to assess at this time. Pt with increased nutritional needs for post-operative healing and would benefit from addition of oral nutrition supplements.   Reviewed wt hx; no wt loss over the past year.  Medications reviewed and include prednisone and levaquin.   Labs reviewed.   Diet Order:   Diet Order            Diet Heart Room service appropriate? Yes; Fluid consistency: Thin  Diet effective now              EDUCATION NEEDS:   No education needs have been identified at this time  Skin:  Skin Assessment: Skin Integrity Issues: Skin Integrity Issues:: Incisions Incisions: lt chest tube  Last BM:   Unknown  Height:   Ht Readings from Last 1 Encounters:  06/21/19 5\' 6"  (1.676 m)    Weight:   Wt Readings from Last 1 Encounters:  06/21/19 59 kg    Ideal Body Weight:  59.1 kg  BMI:  Body mass index is 20.98 kg/m.  Estimated Nutritional Needs:   Kcal:  1750-1950  Protein:  90-105 grams  Fluid:  > 1.7 L    06/23/19, RD, LDN, CDCES Registered Dietitian II Certified Diabetes Care and Education Specialist Please refer to [facility] for RD and/or RD on-call/weekend/after hours pager

## 2019-06-23 NOTE — ED Notes (Signed)
X-ray at bedside

## 2019-06-23 NOTE — Progress Notes (Signed)
PROGRESS NOTE    Alicia Gibson  FTD:[id number] DOB: 06-24-1960 DOA: 06/22/2019 PCP: Verl Bangs, FNP     Brief Narrative:  Alicia Gibson is 59 y.o. 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 Has needed to use 4L O2 for past several days. Endorses subjective fever, wet sounding cough, wheezing and shortness of breath. Did 2 nebulizer treatments prior to arrival. Also states she has not been able to eat or drink anything for the past 3 days. Denies chest pain, abdominal pain, nausea, vomiting or diarrhea.   ED work-up/course:  59 year old female with COPD on nasal cannula O2 presenting with subjective fever, cough, wheezing and shortness of breath.Differential includes, but is not limited to, viral syndrome, bronchitis including COPD exacerbation, pneumonia, reactive airway disease including asthma, CHF including exacerbation with or without pulmonary/interstitial edema, pneumothorax, ACS, thoracic trauma, and pulmonary embolism.  Laboratory results demonstrate mild leukocytosis, elevated troponin which is likely stress leak. Will administer DuoNeb which will make patient's fourth nebulizer treatment tonight, 125 mg Solu-Medrol. Will discuss with hospitalist services for admission.   Subjective: 5/30 afebrile overnight, A/O x4, positive acute on chronic respiratory failure with hypoxia, positive chest pain around chest tube site.  Negative abdominal pain.   Assessment & Plan:   Active Problems:   Chronic bronchitis with COPD (chronic obstructive pulmonary disease) (HCC)   Anxiety and depression   Elevated lipids   Essential hypertension   Pulmonary emphysema (HCC)   COPD exacerbation (HCC)   Chronic respiratory failure (HCC)   Elevated troponin   Pneumothorax on left   Pneumothorax  Acute on chronic respiratory failure with hypoxia -Arformoterol nebulizer 50 mcg  BID -Budesonide 0.5 mg BID -Ipratropium nebulizer QID -Xopenex nebulizer PRN -Prednisone 40 mg daily -Incentive spirometry   COPD -See respiratory failure  Spontaneous LEFT pneumothorax  -5/29 LEFT chest tube placed 14 Fr chest tube inserted -On exam negative air leak, site covered and clean. -5/30 LEFT pneumothorax appears resolved will place to waterseal, however since I did not insert chest tube will allow surgery to make that decision.   Subjective Fevers -Lactic acid negative, patient afebrile but given her extensive lung disease would complete 5-day course antibiotics  Elevated Troponin  Troponin I elevated to 70. Most likely related to demand ischemia. EKG without acute ST segment changes.  -trend troponins  -repeat EKG in AM   HTN  -5/30 increase amlodipine 10 mg  HLD  -continue ASA and statin therapy   Anxiety/Depression  -continue Sertraline 100 mg   Tobacco abuse -Nicotine patch    DVT prophylaxis: Lovenox Code Status: Full Family Communication:  Disposition Plan:  Status is: Inpatient    Dispo: The patient is from: Home              Anticipated d/c is to: Home              Anticipated d/c date is: 7 June              Patient currently unstable      Consultants:  Surgery Dr. Bennett Scrape Pisc[place]    Procedures/Significant Events:  5/29 LEFT chest tube placed 14 Fr chest tube inserted5/29>>> 5/30 PCXR;-no pneumothorax. Stable peripheral left chest tube position. -Hyperinflated lungs and emphysema, compatible with COPD. -. Stable patchy left lung base opacity, favor atelectasis   I have personally reviewed and interpreted all radiology studies and my findings are as  above.  VENTILATOR SETTINGS:    Cultures 5/29 blood pending 5/29 SARS coronavirus negative 5/29 respiratory virus panel pending 5/29 sputum pending   Antimicrobials: Anti-infectives (From admission, onward)   Start     Dose/Rate Stop   06/22/19 0900  levofloxacin  (LEVAQUIN) IVPB 750 mg     750 mg 100 mL/hr over 90 Minutes 06/27/19 0859       Devices    LINES / TUBES:      Continuous Infusions: . levofloxacin (LEVAQUIN) IV Stopped (06/22/19 1343)     Objective: Vitals:   06/23/19 0500 06/23/19 0530 06/23/19 0600 06/23/19 0758  BP: (!) 153/90 (!) 150/85 (!) 149/99 (!) 157/95  Pulse: 94 92  93  Resp: (!) 31 (!) 24 15 (!) 29  Temp:    97.9 F (36.6 C)  TempSrc:    Oral  SpO2: 96% 93%  97%  Weight:      Height:       No intake or output data in the 24 hours ending 06/23/19 0848 Filed Weights   06/21/19 2319  Weight: 59 kg   Physical Exam:  General: A/O x4, positive acute on chronic respiratory distress, cachectic Eyes: negative scleral hemorrhage, negative anisocoria, negative icterus ENT: Negative Runny nose, negative gingival bleeding, Neck:  Negative scars, masses, torticollis, lymphadenopathy, JVD Lungs: Clear to auscultation bilaterally without wheezes or crackles, LEFT side +14 French chest tube in place.  Minimal serosanguineous drainage. Cardiovascular: Regular rate and rhythm without murmur gallop or rub normal S1 and S2 Abdomen: negative abdominal pain, nondistended, positive soft, bowel sounds, no rebound, no ascites, no appreciable mass Extremities: No significant cyanosis, clubbing, or edema bilateral lower extremities Skin: Negative rashes, lesions, ulcers Psychiatric:  Negative depression, negative anxiety, negative fatigue, negative mania  Central nervous system:  Cranial nerves II through XII intact, tongue/uvula midline, all extremities muscle strength 5/5, sensation intact throughout, negative dysarthria, negative expressive aphasia, negative receptive aphasia.  .     Data Reviewed: Care during the described time interval was provided by me .  I have reviewed this patient's available data, including medical history, events of note, physical examination, and all test results as part of my  evaluation.  CBC: Recent Labs  Lab 06/21/19 2323 06/22/19 0924 06/23/19 0602  WBC 13.7* 11.3* 11.0*  NEUTROABS 11.5* 10.8* 8.7*  HGB 13.8 12.7 11.8*  HCT 43.3 40.6 36.6  MCV 93.5 93.8 91.3  PLT 272 220 229   Basic Metabolic Panel: Recent Labs  Lab 06/21/19 2323 06/22/19 0924 06/23/19 0602  NA 134* 135 135  K 3.9 4.5 3.7  CL 83* 84* 85*  CO2 39* 40* 37*  GLUCOSE 126* 200* 102*  BUN 16 21* 23*  CREATININE 0.74 0.79 0.73  CALCIUM 9.8 9.1 9.3  MG  --  2.1 2.1  PHOS  --  5.7* 2.9   GFR: Estimated Creatinine Clearance: 72.3 mL/min (by C-G formula based on SCr of 0.73 mg/dL). Liver Function Tests: Recent Labs  Lab 06/22/19 0924 06/23/19 0602  AST 21 18  ALT 17 14  ALKPHOS 61 53  BILITOT 0.6 0.8  PROT 7.8 6.9  ALBUMIN 4.1 3.6   No results for input(s): LIPASE, AMYLASE in the last 168 hours. No results for input(s): AMMONIA in the last 168 hours. Coagulation Profile: No results for input(s): INR, PROTIME in the last 168 hours. Cardiac Enzymes: No results for input(s): CKTOTAL, CKMB, CKMBINDEX, TROPONINI in the last 168 hours. BNP (last 3 results) No results for input(s): PROBNP in  the last 8760 hours. HbA1C: No results for input(s): HGBA1C in the last 72 hours. CBG: No results for input(s): GLUCAP in the last 168 hours. Lipid Profile: No results for input(s): CHOL, HDL, LDLCALC, TRIG, CHOLHDL, LDLDIRECT in the last 72 hours. Thyroid Function Tests: No results for input(s): TSH, T4TOTAL, FREET4, T3FREE, THYROIDAB in the last 72 hours. Anemia Panel: No results for input(s): VITAMINB12, FOLATE, FERRITIN, TIBC, IRON, RETICCTPCT in the last 72 hours. Sepsis Labs: Recent Labs  Lab 06/22/19 0100 06/22/19 0924 06/22/19 1133  LATICACIDVEN 1.0 1.1 1.2    Recent Results (from the past 240 hour(s))  Culture, blood (routine x 2)     Status: None (Preliminary result)   Collection Time: 06/22/19 12:05 AM   Specimen: BLOOD  Result Value Ref Range Status   Specimen  Description BLOOD Blood Culture adequate volume  Final   Special Requests   Final    BOTTLES DRAWN AEROBIC AND ANAEROBIC BLOOD LEFT FOREARM   Culture   Final    NO GROWTH 1 DAY Performed at [facility], [address], [place], [place] [address]    Report Status PENDING  Incomplete  SARS Coronavirus 2 by RT PCR (hospital order, performed in [facility] hospital lab) Nasopharyngeal Nasopharyngeal Swab     Status: None   Collection Time: 06/22/19 12:05 AM   Specimen: Nasopharyngeal Swab  Result Value Ref Range Status   SARS Coronavirus 2 NEGATIVE NEGATIVE Final    Comment: (NOTE) SARS-CoV-2 target nucleic acids are NOT DETECTED. The SARS-CoV-2 RNA is generally detectable in upper and lower respiratory specimens during the acute phase of infection. The lowest concentration of SARS-CoV-2 viral copies this assay can detect is 250 copies / mL. A negative result does not preclude SARS-CoV-2 infection and should not be used as the sole basis for treatment or other patient management decisions.  A negative result may occur with improper specimen collection / handling, submission of specimen other than nasopharyngeal swab, presence of viral mutation(s) within the areas targeted by this assay, and inadequate number of viral copies (<250 copies / mL). A negative result must be combined with clinical observations, patient history, and epidemiological information. Fact Sheet for Patients:   BoilerBrush.com.cy Fact Sheet for Healthcare Providers: https://pope.com/ This test is not yet approved or cleared  by the [place] FDA and has been authorized for detection and/or diagnosis of SARS-CoV-2 by FDA under an Emergency Use Authorization (EUA).  This EUA will remain in effect (meaning this test can be used) for the duration of the COVID-19 declaration under Section 564(b)(1) of the Act, 21 U.S.C. section 360bbb-3(b)(1), unless the  authorization is terminated or revoked sooner. Performed at [facility], [address] [place], [place] [address]   Culture, blood (routine x 2)     Status: None (Preliminary result)   Collection Time: 06/22/19 12:10 AM   Specimen: BLOOD  Result Value Ref Range Status   Specimen Description BLOOD Blood Culture adequate volume  Final   Special Requests   Final    BOTTLES DRAWN AEROBIC AND ANAEROBIC RESISTANT WRIST   Culture   Final    NO GROWTH 1 DAY Performed at [facility], [address] [place], [place] [address]    Report Status PENDING  Incomplete         Radiology Studies: DG Chest 2 View  Result Date: 06/22/2019 CLINICAL DATA:  Shortness of breath. EXAM: CHEST - 2 VIEW COMPARISON:  Radiograph 12/06/2017 FINDINGS: Large left pneumothorax with near  complete collapse of the left lung. No evidence of tension or mediastinal shift. The left lung is atelectatic. Hyperinflated right lung with emphysema. No pleural fluid or focal airspace disease. Heart is normal in size. No acute osseous abnormalities are seen. IMPRESSION: Large left pneumothorax with near complete collapse of the left lung. No evidence of tension. Critical Value/emergent results were called by telephone at the time of interpretation on 06/22/2019 at 12:33 am to Dr Lesly Rubenstein SUNG , who verbally acknowledged these results. Electronically Signed   By: Narda Rutherford M.D.   On: 06/22/2019 00:33   DG Chest Port 1 View  Result Date: 06/23/2019 CLINICAL DATA:  Left pneumothorax with chest tube in place.  COPD. EXAM: PORTABLE CHEST 1 VIEW COMPARISON:  Chest radiograph from one day prior. FINDINGS: Stable peripheral left chest tube. Stable cardiomediastinal silhouette with normal heart size. No pneumothorax. No significant pleural effusions. Hyperinflated lungs. Emphysema. No pulmonary edema. Stable patchy left lung base opacity. IMPRESSION: 1. No pneumothorax. Stable peripheral left chest tube position. 2.  Hyperinflated lungs and emphysema, compatible with COPD. 3. Stable patchy left lung base opacity, favor atelectasis. Electronically Signed   By: Delbert Phenix M.D.   On: 06/23/2019 07:17   DG Chest Port 1 View  Result Date: 06/22/2019 CLINICAL DATA:  Follow-up left pneumothorax EXAM: PORTABLE CHEST 1 VIEW COMPARISON:  06/22/2019 FINDINGS: Left chest tube is again noted although has withdrawn somewhat in the interval with only half of the sideholes within the chest cavity. No recurrent pneumothorax on the left is noted. Diffuse interstitial changes are again noted bilaterally consistent with chronic scarring. No new focal abnormality is seen. Aortic calcifications are noted. Cardiac shadow is stable. IMPRESSION: Slight withdrawal of left chest tube with approximately half of the sideholes extrinsic to the ribcage. No recurrent pneumothorax is noted. Electronically Signed   By: Alcide Clever M.D.   On: 06/22/2019 13:08   DG Chest Portable 1 View  Result Date: 06/22/2019 CLINICAL DATA:  Chest tube placement EXAM: PORTABLE CHEST 1 VIEW COMPARISON:  Jun 22, 2019 FINDINGS: The heart size and mediastinal contours are within normal limits. There is interval placement of a left-sided pigtail catheter with re-expansion of the left lung. No definite residual pneumothorax is seen. Bilateral coarsened interstitial markings are seen throughout both lungs with hyperinflation of the upper lung zones. No pleural effusion. IMPRESSION: Interval placement of left-sided pigtail catheter with no residual pneumothorax seen. Electronically Signed   By: Jonna Clark M.D.   On: 06/22/2019 03:31        Scheduled Meds: . acetaminophen  1,000 mg Oral Q6H  . amLODipine  5 mg Oral Daily  . arformoterol  15 mcg Nebulization Q12H  . aspirin EC  81 mg Oral Daily  . atorvastatin  40 mg Oral Daily  . budesonide  0.5 mg Nebulization BID  . enoxaparin (LOVENOX) injection  40 mg Subcutaneous Q24H  . ipratropium  0.5 mg Nebulization  Q6H  . ketorolac  15 mg Intravenous Q6H  . loratadine  10 mg Oral Daily  . nicotine  21 mg Transdermal Daily  . pantoprazole  20 mg Oral Daily  . predniSONE  40 mg Oral Q breakfast  . sertraline  100 mg Oral Daily   Continuous Infusions: . levofloxacin (LEVAQUIN) IV Stopped (06/22/19 1343)     LOS: 1 day    Time spent:40 min    Chaunice Obie, Roselind Messier, MD [facility] Pager [phone]  If 7PM-7AM, please contact night-coverage [email or web address] Password [facility] 06/23/2019, 8:48  AM  

## 2019-06-23 NOTE — Progress Notes (Signed)
PT Cancellation Note  Patient Details Name: Alicia Gibson MRN: [id number] DOB: Mar 10, 1960   Cancelled Treatment:    Reason Eval/Treat Not Completed: Medical issues which prohibited therapy  During chart review MEWS noted to be red.  Discussed with RN upon arrival to unit.  Stated pt had just used to bathroom and was settling back in bed.  Will hold session at this time given recent mobility/OOB and continue as appropriate.   Danielle Dess 06/23/2019, 2:33 PM

## 2019-06-23 NOTE — ED Notes (Signed)
Attempted to call receiving RN. Provided name and number. Awaiting return call.

## 2019-06-23 NOTE — Progress Notes (Signed)
ED attempted to call report. Would not accept due to MEWS score of 3 with tachycardia, tachypnea (with a new chest tube and on 4L- O2 [place]) and droplet precautions of unknown reason. Also, 3 different respiratory/microbiology tests ordered and not done including one that is a STAT order. This patient was originally assigned to a progressive unit. Asked ED RN to address these issues and call me back.

## 2019-06-23 NOTE — Progress Notes (Signed)
06/23/2019  Subjective: Patient is s/p left chest tube placement.  NO acute events overnight.  Continues with some left chest pain from the tube, but controllable with pain meds.  CXR this morning did not show any pneumothorax.  Vital signs: Temp:  [97.9 F (36.6 C)-98.5 F (36.9 C)] 98.4 F (36.9 C) (05/30 1248) Pulse Rate:  [92-108] 100 (05/30 1315) Resp:  [15-37] 28 (05/30 1315) BP: (131-163)/(74-99) 138/76 (05/30 1248) SpO2:  [93 %-99 %] 96 % (05/30 1315)   Intake/Output: No intake/output data recorded.    Physical Exam: Constitutional: No acute distress Pulm:  Left chest tube in place, clean dressing, no air leak on pleuravac.   Labs:  Recent Labs    06/22/19 0924 06/23/19 0602  WBC 11.3* 11.0*  HGB 12.7 11.8*  HCT 40.6 36.6  PLT 220 229   Recent Labs    06/22/19 0924 06/23/19 0602  NA 135 135  K 4.5 3.7  CL 84* 85*  CO2 40* 37*  GLUCOSE 200* 102*  BUN 21* 23*  CREATININE 0.79 0.73  CALCIUM 9.1 9.3   No results for input(s): LABPROT, INR in the last 72 hours.  Imaging: DG Chest Port 1 View  Result Date: 06/23/2019 CLINICAL DATA:  Left pneumothorax with chest tube in place.  COPD. EXAM: PORTABLE CHEST 1 VIEW COMPARISON:  Chest radiograph from one day prior. FINDINGS: Stable peripheral left chest tube. Stable cardiomediastinal silhouette with normal heart size. No pneumothorax. No significant pleural effusions. Hyperinflated lungs. Emphysema. No pulmonary edema. Stable patchy left lung base opacity. IMPRESSION: 1. No pneumothorax. Stable peripheral left chest tube position. 2. Hyperinflated lungs and emphysema, compatible with COPD. 3. Stable patchy left lung base opacity, favor atelectasis. Electronically Signed   By: Delbert Phenix M.D.   On: 06/23/2019 07:17    Assessment/Plan: This is a 59 y.o. female s/p left chest tube placement.  --continue chest tube to suction today. --repeat CXR in AM --if no PTX on CXR tomorrow, will place to waterseal.   Howie Ill, MD [facility]

## 2019-06-23 NOTE — ED Notes (Signed)
Pt assisted up to commode to void. Bedside commode cleansed. Pt assisted back to bed. Po fluids provided. Pt assisted with application fo hand sanitizer.

## 2019-06-24 ENCOUNTER — Inpatient Hospital Stay: Payer: Medicaid Other

## 2019-06-24 ENCOUNTER — Other Ambulatory Visit: Payer: Self-pay | Admitting: Family Medicine

## 2019-06-24 DIAGNOSIS — E785 Hyperlipidemia, unspecified: Secondary | ICD-10-CM

## 2019-06-24 LAB — CBC WITH DIFFERENTIAL/PLATELET
Abs Immature Granulocytes: 0.07 10*3/uL (ref 0.00–0.07)
Basophils Absolute: 0.1 10*3/uL (ref 0.0–0.1)
Basophils Relative: 1 %
Eosinophils Absolute: 0.3 10*3/uL (ref 0.0–0.5)
Eosinophils Relative: 3 %
HCT: 36.3 % (ref 36.0–46.0)
Hemoglobin: 11.9 g/dL — ABNORMAL LOW (ref 12.0–15.0)
Immature Granulocytes: 1 %
Lymphocytes Relative: 13 %
Lymphs Abs: 1.6 10*3/uL (ref 0.7–4.0)
MCH: 29.2 pg (ref 26.0–34.0)
MCHC: 32.8 g/dL (ref 30.0–36.0)
MCV: 89 fL (ref 80.0–100.0)
Monocytes Absolute: 0.8 10*3/uL (ref 0.1–1.0)
Monocytes Relative: 7 %
Neutro Abs: 9.3 10*3/uL — ABNORMAL HIGH (ref 1.7–7.7)
Neutrophils Relative %: 75 %
Platelets: 248 10*3/uL (ref 150–400)
RBC: 4.08 MIL/uL (ref 3.87–5.11)
RDW: 13.2 % (ref 11.5–15.5)
WBC: 12.1 10*3/uL — ABNORMAL HIGH (ref 4.0–10.5)
nRBC: 0 % (ref 0.0–0.2)

## 2019-06-24 LAB — COMPREHENSIVE METABOLIC PANEL
ALT: 19 U/L (ref 0–44)
AST: 30 U/L (ref 15–41)
Albumin: 3.3 g/dL — ABNORMAL LOW (ref 3.5–5.0)
Alkaline Phosphatase: 70 U/L (ref 38–126)
Anion gap: 11 (ref 5–15)
BUN: 33 mg/dL — ABNORMAL HIGH (ref 6–20)
CO2: 35 mmol/L — ABNORMAL HIGH (ref 22–32)
Calcium: 9.2 mg/dL (ref 8.9–10.3)
Chloride: 89 mmol/L — ABNORMAL LOW (ref 98–111)
Creatinine, Ser: 0.96 mg/dL (ref 0.44–1.00)
GFR calc Af Amer: >60 mL/min (ref >60)
GFR calc non Af Amer: >60 mL/min (ref >60)
Glucose, Bld: 97 mg/dL (ref 70–99)
Potassium: 3.8 mmol/L (ref 3.5–5.1)
Sodium: 135 mmol/L (ref 135–145)
Total Bilirubin: 0.6 mg/dL (ref 0.3–1.2)
Total Protein: 6.3 g/dL — ABNORMAL LOW (ref 6.5–8.1)

## 2019-06-24 LAB — MAGNESIUM: Magnesium: 2 mg/dL (ref 1.7–2.4)

## 2019-06-24 LAB — PHOSPHORUS: Phosphorus: 2.6 mg/dL (ref 2.5–4.6)

## 2019-06-24 NOTE — Progress Notes (Signed)
PROGRESS NOTE    Alicia Gibson  LGX:[id number] DOB: Jun 17, 1960 DOA: 06/22/2019 PCP: [name], FNP     Brief Narrative:  Alicia Gibson is 59 y.o. 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 Has needed to use 4L O2 for past several days. Endorses subjective fever, wet sounding cough, wheezing and shortness of breath. Did 2 nebulizer treatments prior to arrival. Also states she has not been able to eat or drink anything for the past 3 days. Denies chest pain, abdominal pain, nausea, vomiting or diarrhea.   ED work-up/course:  59 year old female with COPD on nasal cannula O2 presenting with subjective fever, cough, wheezing and shortness of breath.Differential includes, but is not limited to, viral syndrome, bronchitis including COPD exacerbation, pneumonia, reactive airway disease including asthma, CHF including exacerbation with or without pulmonary/interstitial edema, pneumothorax, ACS, thoracic trauma, and pulmonary embolism.  Laboratory results demonstrate mild leukocytosis, elevated troponin which is likely stress leak. Will administer DuoNeb which will make patient's fourth nebulizer treatment tonight, 125 mg Solu-Medrol. Will discuss with hospitalist services for admission.   Subjective: 5/31 afebrile overnight, A/O x4, positive acute on chronic respiratory failure with hypoxia.  Having left-sided chest pain secondary to chest tube removal   Assessment & Plan:   Active Problems:   Chronic bronchitis with COPD (chronic obstructive pulmonary disease) (HCC)   Anxiety and depression   Elevated lipids   Essential hypertension   Pulmonary emphysema (HCC)   COPD exacerbation (HCC)   Chronic respiratory failure (HCC)   Elevated troponin   Pneumothorax on left   Pneumothorax   Acute on chronic respiratory failure with hypoxia (HCC)  Acute on chronic respiratory failure  with hypoxia -Arformoterol nebulizer 50 mcg BID -Budesonide 0.5 mg BID -Ipratropium nebulizer QID -Xopenex nebulizer PRN -Prednisone 40 mg daily -Incentive spirometry  COPD -See respiratory failure  Spontaneous LEFT pneumothorax  -5/29 LEFT chest tube placed 14 Fr chest tube inserted -On exam negative air leak, site covered and clean. -5/31 LEFT pneumothorax appears resolved: LEFT chest tube removed   Subjective Fevers -Lactic acid negative, patient afebrile but given her extensive lung disease would complete 5-day course antibiotics  Elevated Troponin  Troponin I elevated to 70. Most likely related to demand ischemia. EKG without acute ST segment changes.  -trend troponins  Results for Alicia Gibson, Alicia Gibson (MRN [id number]) as of 06/24/2019 18:09  Ref. Range 06/21/2019 23:23 06/22/2019 01:22  Troponin I (High Sensitivity) Latest Ref Range: <18 ng/L 70 (H) 60 (H)    HTN  -5/30 increase amlodipine 10 mg -5/31 well-controlled today  HLD  -Lipitor 40 mg daily  Anxiety/Depression  -continue Sertraline 100 mg   Tobacco abuse -Nicotine patch    DVT prophylaxis: Lovenox Code Status: Full Family Communication:  Disposition Plan:  Status is: Inpatient    Dispo: The patient is from: Home              Anticipated d/c is to: Home              Anticipated d/c date is: 6/1              Patient currently unstable      Consultants:  Surgery Dr. Adline Peals [place]    Procedures/Significant Events:  5/29 LEFT chest tube placed 14 Fr chest tube inserted5/29>>> 5/30 PCXR;-no pneumothorax. Stable peripheral left chest tube position. -Hyperinflated lungs and emphysema, compatible with COPD. -. Stable patchy left  lung base opacity, favor atelectasis   I have personally reviewed and interpreted all radiology studies and my findings are as above.  VENTILATOR SETTINGS: Nasal cannula 5/31 Flow 4 L/min SPO2 91%   Cultures 5/29 blood pending 5/29 SARS coronavirus  negative 5/29 respiratory virus panel pending 5/29 sputum pending 5/29 blood NGTD    Antimicrobials: Anti-infectives (From admission, onward)   Start     Dose/Rate Stop   06/22/19 0900  levofloxacin (LEVAQUIN) IVPB 750 mg     750 mg 100 mL/hr over 90 Minutes 06/27/19 0859       Devices    LINES / TUBES:      Continuous Infusions: . levofloxacin (LEVAQUIN) IV 750 mg (06/24/19 0909)     Objective: Vitals:   06/23/19 2053 06/24/19 0500 06/24/19 0859 06/24/19 1240  BP: (!) 143/91 (!) 150/95 130/77 128/78  Pulse: (!) 105   (!) 107  Resp: 20     Temp: 97.7 F (36.5 C) (!) 97.5 F (36.4 C)  97.6 F (36.4 C)  TempSrc: Oral Axillary  Oral  SpO2: 98% 94%  91%  Weight:      Height:        Intake/Output Summary (Last 24 hours) at 06/24/2019 1423 Last data filed at 06/23/2019 1817 Gross per 24 hour  Intake 240 ml  Output 5 ml  Net 235 ml   Filed Weights   06/21/19 2319  Weight: 59 kg   Physical Exam:  General: A/O x4, positive acute on chronic respiratory distress, cachectic Eyes: negative scleral hemorrhage, negative anisocoria, negative icterus ENT: Negative Runny nose, negative gingival bleeding, Neck:  Negative scars, masses, torticollis, lymphadenopathy, JVD Lungs: Clear to auscultation bilaterally without wheezes or crackles Cardiovascular: Regular rate and rhythm without murmur gallop or rub normal S1 and S2 Abdomen: negative abdominal pain, nondistended, positive soft, bowel sounds, no rebound, no ascites, no appreciable mass Extremities: No significant cyanosis, clubbing, or edema bilateral lower extremities Skin: Negative rashes, lesions, ulcers Psychiatric:  Negative depression, negative anxiety, negative fatigue, negative mania  Central nervous system:  Cranial nerves II through XII intact, tongue/uvula midline, all extremities muscle strength 5/5, sensation intact throughout, negative dysarthria, negative expressive aphasia, negative receptive  aphasia.  .     Data Reviewed: Care during the described time interval was provided by me .  I have reviewed this patient's available data, including medical history, events of note, physical examination, and all test results as part of my evaluation.  CBC: Recent Labs  Lab 06/21/19 2323 06/22/19 0924 06/23/19 0602 06/24/19 0729  WBC 13.7* 11.3* 11.0* 12.1*  NEUTROABS 11.5* 10.8* 8.7* 9.3*  HGB 13.8 12.7 11.8* 11.9*  HCT 43.3 40.6 36.6 36.3  MCV 93.5 93.8 91.3 89.0  PLT 272 220 229 096   Basic Metabolic Panel: Recent Labs  Lab 06/21/19 2323 06/22/19 0924 06/23/19 0602 06/24/19 0729  NA 134* 135 135 135  K 3.9 4.5 3.7 3.8  CL 83* 84* 85* 89*  CO2 39* 40* 37* 35*  GLUCOSE 126* 200* 102* 97  BUN 16 21* 23* 33*  CREATININE 0.74 0.79 0.73 0.96  CALCIUM 9.8 9.1 9.3 9.2  MG  --  2.1 2.1 2.0  PHOS  --  5.7* 2.9 2.6   GFR: Estimated Creatinine Clearance: 60.2 mL/min (by C-G formula based on SCr of 0.96 mg/dL). Liver Function Tests: Recent Labs  Lab 06/22/19 0924 06/23/19 0602 06/24/19 0729  AST 21 18 30   ALT 17 14 19   ALKPHOS 61 53 70  BILITOT  0.6 0.8 0.6  PROT 7.8 6.9 6.3*  ALBUMIN 4.1 3.6 3.3*   No results for input(s): LIPASE, AMYLASE in the last 168 hours. No results for input(s): AMMONIA in the last 168 hours. Coagulation Profile: No results for input(s): INR, PROTIME in the last 168 hours. Cardiac Enzymes: No results for input(s): CKTOTAL, CKMB, CKMBINDEX, TROPONINI in the last 168 hours. BNP (last 3 results) No results for input(s): PROBNP in the last 8760 hours. HbA1C: No results for input(s): HGBA1C in the last 72 hours. CBG: No results for input(s): GLUCAP in the last 168 hours. Lipid Profile: No results for input(s): CHOL, HDL, LDLCALC, TRIG, CHOLHDL, LDLDIRECT in the last 72 hours. Thyroid Function Tests: No results for input(s): TSH, T4TOTAL, FREET4, T3FREE, THYROIDAB in the last 72 hours. Anemia Panel: No results for input(s): VITAMINB12,  FOLATE, FERRITIN, TIBC, IRON, RETICCTPCT in the last 72 hours. Sepsis Labs: Recent Labs  Lab 06/22/19 0100 06/22/19 0924 06/22/19 1133  LATICACIDVEN 1.0 1.1 1.2    Recent Results (from the past 240 hour(s))  Culture, blood (routine x 2)     Status: None (Preliminary result)   Collection Time: 06/22/19 12:05 AM   Specimen: BLOOD  Result Value Ref Range Status   Specimen Description BLOOD Blood Culture adequate volume  Final   Special Requests   Final    BOTTLES DRAWN AEROBIC AND ANAEROBIC BLOOD LEFT FOREARM   Culture   Final    NO GROWTH 2 DAYS Performed at [facility], [address], [place], [place] [address]    Report Status PENDING  Incomplete  SARS Coronavirus 2 by RT PCR (hospital order, performed in [facility] hospital lab) Nasopharyngeal Nasopharyngeal Swab     Status: None   Collection Time: 06/22/19 12:05 AM   Specimen: Nasopharyngeal Swab  Result Value Ref Range Status   SARS Coronavirus 2 NEGATIVE NEGATIVE Final    Comment: (NOTE) SARS-CoV-2 target nucleic acids are NOT DETECTED. The SARS-CoV-2 RNA is generally detectable in upper and lower respiratory specimens during the acute phase of infection. The lowest concentration of SARS-CoV-2 viral copies this assay can detect is 250 copies / mL. A negative result does not preclude SARS-CoV-2 infection and should not be used as the sole basis for treatment or other patient management decisions.  A negative result may occur with improper specimen collection / handling, submission of specimen other than nasopharyngeal swab, presence of viral mutation(s) within the areas targeted by this assay, and inadequate number of viral copies (<250 copies / mL). A negative result must be combined with clinical observations, patient history, and epidemiological information. Fact Sheet for Patients:   BoilerBrush.com.cy Fact Sheet for Healthcare  Providers: https://pope.com/ This test is not yet approved or cleared  by the [place] FDA and has been authorized for detection and/or diagnosis of SARS-CoV-2 by FDA under an Emergency Use Authorization (EUA).  This EUA will remain in effect (meaning this test can be used) for the duration of the COVID-19 declaration under Section 564(b)(1) of the Act, 21 U.S.C. section 360bbb-3(b)(1), unless the authorization is terminated or revoked sooner. Performed at [facility], [address] [place], [place] [address]   Culture, blood (routine x 2)     Status: None (Preliminary result)   Collection Time: 06/22/19 12:10 AM   Specimen: BLOOD  Result Value Ref Range Status   Specimen Description BLOOD Blood Culture adequate volume  Final   Special Requests   Final    BOTTLES DRAWN AEROBIC AND ANAEROBIC  RESISTANT WRIST   Culture   Final    NO GROWTH 2 DAYS Performed at [facility], [address] [place], [place] [address]    Report Status PENDING  Incomplete         Radiology Studies: DG Chest Port 1 View  Result Date: 06/24/2019 CLINICAL DATA:  Chest tube placed to water seal EXAM: PORTABLE CHEST 1 VIEW COMPARISON:  Jun 24, 2019 FINDINGS: Chest tube position unchanged. No pneumothorax. There is a left pleural effusion with left base atelectasis. There is mild atelectatic change in the right base. No new opacity evident. Heart size and pulmonary vascularity are within normal limits. No evident adenopathy. No bone lesions. IMPRESSION: No change in chest tube position. No pneumothorax. Stable left pleural effusion. Bibasilar atelectasis. No new opacity. Stable cardiac silhouette. Electronically Signed   By: Bretta Bang III M.D.   On: 06/24/2019 13:08   DG Chest Port 1 View  Result Date: 06/24/2019 CLINICAL DATA:  Recent pneumothorax with chest tube placement EXAM: PORTABLE CHEST 1 VIEW COMPARISON:  Jun 23, 2019 FINDINGS: Chest tube  present peripherally in the left lateral hemithorax, stable. No pneumothorax evident. There is a small left pleural effusion with left base atelectasis. There is mild atelectatic change in the right base. Heart size and pulmonary vascularity are within normal limits. No adenopathy. No bone lesions. IMPRESSION: No appreciable pneumothorax. Small left pleural effusion with left base atelectasis. There is also right base atelectasis. Stable cardiac silhouette. Electronically Signed   By: Bretta Bang III M.D.   On: 06/24/2019 07:53   DG Chest Port 1 View  Result Date: 06/23/2019 CLINICAL DATA:  Left pneumothorax with chest tube in place.  COPD. EXAM: PORTABLE CHEST 1 VIEW COMPARISON:  Chest radiograph from one day prior. FINDINGS: Stable peripheral left chest tube. Stable cardiomediastinal silhouette with normal heart size. No pneumothorax. No significant pleural effusions. Hyperinflated lungs. Emphysema. No pulmonary edema. Stable patchy left lung base opacity. IMPRESSION: 1. No pneumothorax. Stable peripheral left chest tube position. 2. Hyperinflated lungs and emphysema, compatible with COPD. 3. Stable patchy left lung base opacity, favor atelectasis. Electronically Signed   By: Delbert Phenix M.D.   On: 06/23/2019 07:17        Scheduled Meds: . acetaminophen  1,000 mg Oral Q6H  . amLODipine  10 mg Oral Daily  . amLODipine  5 mg Oral Daily  . arformoterol  15 mcg Nebulization Q12H  . aspirin EC  81 mg Oral Daily  . atorvastatin  40 mg Oral Daily  . budesonide  0.5 mg Nebulization BID  . enoxaparin (LOVENOX) injection  40 mg Subcutaneous Q24H  . feeding supplement (ENSURE ENLIVE)  237 mL Oral BID BM  . ipratropium  0.5 mg Nebulization Q6H  . ketorolac  15 mg Intravenous Q6H  . loratadine  10 mg Oral Daily  . multivitamin with minerals  1 tablet Oral Daily  . nicotine  21 mg Transdermal Daily  . pantoprazole  20 mg Oral Daily  . predniSONE  40 mg Oral Q breakfast  . sertraline  100 mg  Oral Daily   Continuous Infusions: . levofloxacin (LEVAQUIN) IV 750 mg (06/24/19 0909)     LOS: 2 days    Time spent:40 min    Khyla Mccumbers, Roselind Messier, MD [facility] Pager [phone]  If 7PM-7AM, please contact night-coverage [email or web address] Password [email or web address] 06/24/2019, 2:23 PM

## 2019-06-24 NOTE — Progress Notes (Signed)
06/24/2019  Subjective: No acute events.  Patient's chest tube without issues.  CXR this morning again without pneumothorax.  Vital signs: Temp:  [97.5 F (36.4 C)-98.5 F (36.9 C)] 97.5 F (36.4 C) (05/31 0500) Pulse Rate:  [96-108] 105 (05/30 2053) Resp:  [20-37] 20 (05/30 2053) BP: (138-150)/(76-95) 150/95 (05/31 0500) SpO2:  [94 %-98 %] 94 % (05/31 0500)   Intake/Output: 05/30 0701 - 05/31 0700 In: 240 [P.O.:240] Out: 5 [Chest Tube:5]    Physical Exam: Constitutional: No acute distress  Pulm:  Left chest tube in place.  Dressing clean, dry.  No air leak.  Labs:  Recent Labs    06/22/19 0924 06/23/19 0602  WBC 11.3* 11.0*  HGB 12.7 11.8*  HCT 40.6 36.6  PLT 220 229   Recent Labs    06/22/19 0924 06/23/19 0602  NA 135 135  K 4.5 3.7  CL 84* 85*  CO2 40* 37*  GLUCOSE 200* 102*  BUN 21* 23*  CREATININE 0.79 0.73  CALCIUM 9.1 9.3   No results for input(s): LABPROT, INR in the last 72 hours.  Imaging: DG Chest Port 1 View  Result Date: 06/24/2019 CLINICAL DATA:  Recent pneumothorax with chest tube placement EXAM: PORTABLE CHEST 1 VIEW COMPARISON:  Jun 23, 2019 FINDINGS: Chest tube present peripherally in the left lateral hemithorax, stable. No pneumothorax evident. There is a small left pleural effusion with left base atelectasis. There is mild atelectatic change in the right base. Heart size and pulmonary vascularity are within normal limits. No adenopathy. No bone lesions. IMPRESSION: No appreciable pneumothorax. Small left pleural effusion with left base atelectasis. There is also right base atelectasis. Stable cardiac silhouette. Electronically Signed   By: Bretta Bang III M.D.   On: 06/24/2019 07:53    Assessment/Plan: This is a 59 y.o. female s/p left chest tube placement for pneumothorax.  --will place chest tube to waterseal this morning. --repeat CXR at 1 pm.  If no PTX, will likely remove chest tube.   Howie Ill, MD [facility]

## 2019-06-25 ENCOUNTER — Inpatient Hospital Stay: Payer: Medicaid Other

## 2019-06-25 LAB — CBC WITH DIFFERENTIAL/PLATELET
Abs Immature Granulocytes: 0.06 10*3/uL (ref 0.00–0.07)
Basophils Absolute: 0 10*3/uL (ref 0.0–0.1)
Basophils Relative: 0 %
Eosinophils Absolute: 0.2 10*3/uL (ref 0.0–0.5)
Eosinophils Relative: 2 %
HCT: 34.5 % — ABNORMAL LOW (ref 36.0–46.0)
Hemoglobin: 10.9 g/dL — ABNORMAL LOW (ref 12.0–15.0)
Immature Granulocytes: 1 %
Lymphocytes Relative: 12 %
Lymphs Abs: 1.1 10*3/uL (ref 0.7–4.0)
MCH: 29.3 pg (ref 26.0–34.0)
MCHC: 31.6 g/dL (ref 30.0–36.0)
MCV: 92.7 fL (ref 80.0–100.0)
Monocytes Absolute: 0.7 10*3/uL (ref 0.1–1.0)
Monocytes Relative: 7 %
Neutro Abs: 7 10*3/uL (ref 1.7–7.7)
Neutrophils Relative %: 78 %
Platelets: 318 10*3/uL (ref 150–400)
RBC: 3.72 MIL/uL — ABNORMAL LOW (ref 3.87–5.11)
RDW: 13.6 % (ref 11.5–15.5)
WBC: 8.9 10*3/uL (ref 4.0–10.5)
nRBC: 0 % (ref 0.0–0.2)

## 2019-06-25 LAB — COMPREHENSIVE METABOLIC PANEL
ALT: 20 U/L (ref 0–44)
AST: 33 U/L (ref 15–41)
Albumin: 3.4 g/dL — ABNORMAL LOW (ref 3.5–5.0)
Alkaline Phosphatase: 52 U/L (ref 38–126)
Anion gap: 8 (ref 5–15)
BUN: 27 mg/dL — ABNORMAL HIGH (ref 6–20)
CO2: 37 mmol/L — ABNORMAL HIGH (ref 22–32)
Calcium: 8.9 mg/dL (ref 8.9–10.3)
Chloride: 95 mmol/L — ABNORMAL LOW (ref 98–111)
Creatinine, Ser: 0.89 mg/dL (ref 0.44–1.00)
GFR calc Af Amer: >60 mL/min (ref >60)
GFR calc non Af Amer: >60 mL/min (ref >60)
Glucose, Bld: 91 mg/dL (ref 70–99)
Potassium: 3.6 mmol/L (ref 3.5–5.1)
Sodium: 140 mmol/L (ref 135–145)
Total Bilirubin: 0.5 mg/dL (ref 0.3–1.2)
Total Protein: 6.3 g/dL — ABNORMAL LOW (ref 6.5–8.1)

## 2019-06-25 LAB — MAGNESIUM: Magnesium: 2.1 mg/dL (ref 1.7–2.4)

## 2019-06-25 LAB — PHOSPHORUS: Phosphorus: 3.7 mg/dL (ref 2.5–4.6)

## 2019-06-25 MED ORDER — LEVOFLOXACIN 750 MG PO TABS: 750.0000 mg | ORAL_TABLET | Freq: Every day | ORAL | 0 refills | Status: DC

## 2019-06-25 MED ORDER — OXYCODONE HCL 5 MG PO TABS: 5.0000 mg | ORAL_TABLET | ORAL | 0 refills | Status: DC | PRN

## 2019-06-25 MED ORDER — HYDROXYZINE HCL 25 MG PO TABS: 25.0000 mg | ORAL_TABLET | Freq: Three times a day (TID) | ORAL | 0 refills | Status: DC | PRN

## 2019-06-25 MED ORDER — NICOTINE 21 MG/24HR TD PT24: 21.0000 mg | MEDICATED_PATCH | Freq: Every day | TRANSDERMAL | 0 refills | Status: DC

## 2019-06-25 MED ORDER — PREDNISONE 20 MG PO TABS: 40.0000 mg | ORAL_TABLET | Freq: Every day | ORAL | 0 refills | Status: DC

## 2019-06-25 MED ORDER — AMLODIPINE BESYLATE 10 MG PO TABS: 10.0000 mg | ORAL_TABLET | Freq: Every day | ORAL | 0 refills | Status: DC

## 2019-06-25 MED ORDER — IPRATROPIUM-ALBUTEROL 0.5-2.5 (3) MG/3ML IN SOLN
3.0000 mL | Freq: Three times a day (TID) | RESPIRATORY_TRACT | Status: DC
  Administered 2019-06-25: 3 mL via RESPIRATORY_TRACT
  Filled 2019-06-25: qty 3

## 2019-06-25 MED ORDER — ALBUTEROL SULFATE (2.5 MG/3ML) 0.083% IN NEBU: 2.5000 mg | INHALATION_SOLUTION | RESPIRATORY_TRACT | Status: DC | PRN

## 2019-06-25 MED ORDER — LEVOFLOXACIN 750 MG PO TABS: 750.0000 mg | ORAL_TABLET | Freq: Every day | ORAL | Status: DC

## 2019-06-25 NOTE — TOC Initial Note (Addendum)
Transition of Care [facility]) - Initial/Assessment Note    Patient Details  Name: Alicia Gibson MRN: [id number] Date of Birth: [birth year]  [facility] CM/SW Contact:    Beverly Sessions, RN Phone Number: 06/25/2019, 4:08 PM  Clinical Narrative:                  Patient admitted with COPD exacerbation.   S/p chest tube removal   Patient states that she lives at home with her roommate who is her best friend  PCP [facility] - denies issues with transportation or obtaining medications.   Patient states that she has continuous home O2 and a working nebulizer  Roommate will bring portable O2 for discharge.   PT has assessed patient and recommends home health PT and RW.  Patient in agreement.  Referral made to Zack with [employer] to deliver RW to room prior to discharge.  Patient states that she does not have a preference of home health agency.  Patient would also benefit from home health RN for management of COPD.  Referral made to [facility] with [place].  Notified MD via voicemail and secure chat to place order for home health   Update: notifed by [place] at 445 pm that they were unable to staff the case.  RNCM unable to find another agency that was able to provide home health services.  RNCM reached out to patient via phone at home not notify her I was unable to set her up with home health services    Expected Discharge Plan: [place] Services Barriers to Discharge: No Barriers Identified   Patient Goals and CMS Choice   CMS Medicare.gov Compare Post Acute Care list provided to:: Patient    Expected Discharge Plan and Services Expected Discharge Plan: [place]   Discharge Planning Services: CM Consult Post Acute Care Choice: Durable Medical Equipment, Home Health Living arrangements for the past 2 months: Single Family Home Expected Discharge Date: 06/25/19               DME Arranged: [place]ford Rile rolling DME Agency:  [facility] Date DME Agency Contacted: 06/25/19   Representative spoke with at DME Agency: [place]: RN [place] Agency: [place] ([place]) Date [place]: 06/25/19   Representative spoke with at [place]: [name]  Prior Living Arrangements/Services Living arrangements for the past 2 months: [place] with:: Roommate Patient language and need for interpreter reviewed:: Yes Do you feel safe going back to the place where you live?: Yes      Need for Family Participation in Patient Care: Yes (Comment) Care giver support system in place?: Yes (comment) Current home services: DME Criminal Activity/Legal Involvement Pertinent to Current Situation/Hospitalization: No - Comment as needed  Activities of Daily Living Home Assistive Devices/Equipment: None ADL Screening (condition at time of admission) Patient's cognitive ability adequate to safely complete daily activities?: Yes Is the patient deaf or have difficulty hearing?: No Does the patient have difficulty seeing, even when wearing glasses/contacts?: Yes Does the patient have difficulty concentrating, remembering, or making decisions?: No Patient able to express need for assistance with ADLs?: Yes Does the patient have difficulty dressing or bathing?: No Independently performs ADLs?: Yes (appropriate for developmental age) Does the patient have difficulty walking or climbing stairs?: Yes Weakness of Legs: Both Weakness of Arms/Hands: Both  Permission Sought/Granted  Emotional Assessment Appearance:: Appears older than stated age Attitude/Demeanor/Rapport: Gracious Affect (typically observed): Accepting Orientation: : Oriented to Self, Oriented to Place, Oriented to  Time, Oriented to Situation   Psych Involvement: No (comment)  Admission diagnosis:  Elevated troponin [R77.8] COPD exacerbation (HCC) [J44.1] Pneumothorax on left [J93.9] Pneumothorax [J93.9] Acute  on chronic respiratory failure with hypoxia (HCC) [J96.21] Pneumothorax, unspecified type [J93.9] Patient Active Problem List   Diagnosis Date Noted  . Acute on chronic respiratory failure with hypoxia (HCC) 06/23/2019  . COPD exacerbation (HCC) 06/22/2019  . Chronic respiratory failure (HCC) 06/22/2019  . Elevated troponin 06/22/2019  . Pneumothorax on left 06/22/2019  . Pneumothorax 06/22/2019  . Chronic bronchitis with COPD (chronic obstructive pulmonary disease) (HCC) 05/14/2018  . Anxiety and depression 05/14/2018  . Elevated lipids 05/14/2018  . Seasonal allergic rhinitis due to pollen 05/14/2018  . Essential hypertension 05/14/2018  . Pulmonary emphysema (HCC) 06/23/2015   PCP:  [name], FNP Pharmacy:   [facility] [phone] [place], [place] - [address] AT [facility] OF [address] [place] [address] [place] [address] Phone: [phone] Fax: [phone]     Social Determinants of Health (SDOH) Interventions    Readmission Risk Interventions No flowsheet data found.

## 2019-06-25 NOTE — Progress Notes (Signed)
Chest tube removed yesterday, no PTX on follow-up CXR this morning.   No further surgical needs.  Will sign off.

## 2019-06-25 NOTE — Discharge Summary (Addendum)
Physician Discharge Summary  Montina Dorrance NKN:[id number] DOB: August 07, 1960 DOA: 06/22/2019  PCP: [name], FNP  Admit date: 06/22/2019 Discharge date: 06/25/2019  Time spent:30 minutes  Recommendations for Outpatient Follow-up:   Acute on chronic respiratory failure with hypoxia -Arformoterol nebulizer 50 mcg BID -Budesonide 0.5 mg BID -Ipratropium nebulizer QID -Xopenex nebulizer PRN -Prednisone 40 mg daily -Incentive spirometry -Follow-up with NP Danielle Rankin in 2 weeks acute on chronic respiratory failure with hypoxia, spontaneous LEFT pneumothorax.  COPD -See respiratory failure  Spontaneous LEFT pneumothorax  -5/29 LEFT chest tube placed 14 Fr chest tube inserted -On exam negative air leak, site covered and clean. -5/31 LEFT pneumothorax appears resolved: LEFT chest tube removed  -6/1 pneumothorax resolved patient safe for discharge per surgery.  Subjective Fevers -Lactic acid negative, patient afebrile but given her extensive lung disease would complete 5-day course antibiotics  Elevated Troponin/Demand ischemia Troponin I elevated to 70. Most likely related to demand ischemia. EKG without acute ST segment changes.  -trend troponins  Results for RECIE, CIRRINCIONE (MRN [id number]) as of 06/25/2019 13:19  Ref. Range 06/21/2019 23:23 06/22/2019 01:22  Troponin I (High Sensitivity) Latest Ref Range: <18 ng/L 70 (H) 60 (H)  -Elevated troponin consistent with demand ischemia.  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)   Anxiety and depression   Elevated lipids   Essential hypertension   Pulmonary emphysema (HCC)   COPD exacerbation (HCC)   Chronic respiratory failure (HCC)   Elevated troponin   Pneumothorax  on left   Pneumothorax   Acute on chronic respiratory failure with hypoxia [facility])   Discharge Condition: Stable  Diet recommendation: Heart healthy  Filed Weights   06/21/19 2319  Weight: 59 kg    History of present illness:  [place] y.o.WF PMHx  COPD with chronic respiratory failure on 2.5L O2 at home, HTN, HLD   who presents for worsening SOB and increasing O2 requirement. Has needed to use 4L O2 for past several days. Endorses subjective fever, wet sounding cough, wheezing and shortness of breath. Did 2 nebulizer treatments prior to arrival. Also states she has not been able to eat or drink anything for the past 3 days. Denies chest pain, abdominal pain, nausea, vomiting or diarrhea.   ED work-up/course: 59 year old female with COPD on nasal cannula O2 presenting with subjective fever, cough, wheezing and shortness of breath.Differential includes, but is not limited to, viral syndrome, bronchitis including COPD exacerbation, pneumonia, reactive airway disease including asthma, CHF including exacerbation with or without pulmonary/interstitial edema, pneumothorax, ACS, thoracic trauma, and pulmonary embolism.  Laboratory results demonstrate mild leukocytosis, elevated troponin which is likely stress leak. Will administer DuoNeb which will make patient's fourth nebulizer treatment tonight, 125 mg Solu-Medrol. Will discuss with hospitalist services for admission.  Hospital Course:  See above  Procedures: 5/29 LEFT chest tube placed 14 Fr chest tube inserted5/29>>> 5/30 PCXR;-no pneumothorax. Stable peripheral left chest tube position. -Hyperinflated lungs and emphysema, compatible with COPD. -. Stable patchy left lung base opacity, favor atelectasis   Consultations: Surgery Dr. Adline Peals [place]  Cultures 5/29 blood pending 5/29 SARS coronavirus negative 5/29 respiratory virus panel pending 5/29 sputum pending 5/29 blood  NGTD   Antibiotics Anti-infectives (From admission, onward)   Start     Dose/Rate Stop  06/26/19 1000  levofloxacin (LEVAQUIN) tablet 750 mg     750 mg 06/28/19 0959   06/22/19 0900  levofloxacin (LEVAQUIN) IVPB 750 mg  Status:  Discontinued     750 mg 100 mL/hr over 90 Minutes 06/25/19 1325      Discharge Exam: Vitals:   06/25/19 0500 06/25/19 0746 06/25/19 0851 06/25/19 1237  BP: 125/65  122/65 112/63  Pulse: 87 90  (!) 101  Resp: (!) 22 18    Temp: 97.9 F (36.6 C)   97.6 F (36.4 C)  TempSrc: Oral   Oral  SpO2: 95% 94%  91%  Weight:      Height:        General: A/O x4, positive acute on chronic respiratory distress, cachectic Eyes: negative scleral hemorrhage, negative anisocoria, negative icterus ENT: Negative Runny nose, negative gingival bleeding, Neck:  Negative scars, masses, torticollis, lymphadenopathy, JVD Lungs: Clear to auscultation bilaterally without wheezes or crackles Cardiovascular: Regular rate and rhythm without murmur gallop or rub normal S1 and S2   Discharge Instructions   Allergies as of 06/25/2019      Reactions   Codeine Hives, Nausea And Vomiting   Shellfish Allergy Hives   Penicillins Hives, Rash      Medication List    STOP taking these medications   omeprazole 20 MG capsule Commonly known as: PRILOSEC     TAKE these medications   albuterol (2.5 MG/3ML) 0.083% nebulizer solution Commonly known as: PROVENTIL Take 3 mLs (2.5 mg total) by nebulization 4 (four) times daily. What changed: Another medication with the same name was changed. Make sure you understand how and when to take each.   albuterol 108 (90 Base) MCG/ACT inhaler Commonly known as: VENTOLIN HFA INHALE 2 PUFFS INTO THE LUNGS EVERY 6 HOURS AS NEEDED FOR WHEEZING OR SHORTNESS OF BREATH What changed: See the new instructions.   amLODipine 10 MG tablet Commonly known as: NORVASC Take 1 tablet (10 mg total) by mouth daily. Start taking on: June 26, 2019 What  changed:   medication strength  how much to take   aspirin EC 81 MG tablet Take 81 mg by mouth daily.   atorvastatin 40 MG tablet Commonly known as: LIPITOR TAKE 1 TABLET(40 MG) BY MOUTH DAILY What changed: See the new instructions.   budesonide 0.5 MG/2ML nebulizer solution Commonly known as: PULMICORT Take 2 mLs (0.5 mg total) by nebulization 2 (two) times daily.   cetirizine 10 MG tablet Commonly known as: ZYRTEC Take 1 tablet (10 mg total) by mouth daily.   cyclobenzaprine 10 MG tablet Commonly known as: FLEXERIL TAKE 1 TABLET BY MOUTH 2 TIMES DAILY AS NEEDED FOR MUSCLE SPASMS What changed:   how much to take  how to take this  when to take this  reasons to take this  additional instructions   hydrOXYzine 25 MG tablet Commonly known as: ATARAX/VISTARIL Take 1 tablet (25 mg total) by mouth 3 (three) times daily as needed for anxiety. What changed:   medication strength  how much to take  how to take this  when to take this  reasons to take this  additional instructions   levofloxacin 750 MG tablet Commonly known as: LEVAQUIN Take 1 tablet (750 mg total) by mouth daily. Start taking on: June 26, 2019   Mucinex 600 MG 12 hr tablet Generic drug: guaiFENesin Take 2 tablets (1,200 mg total) by mouth 2 (two) times daily as needed for cough or to loosen phlegm.   multivitamin with minerals  Tabs tablet Take 1 tablet by mouth daily.   nicotine 21 mg/24hr patch Commonly known as: NICODERM CQ - dosed in mg/24 hours Place 1 patch (21 mg total) onto the skin daily. Start taking on: June 26, 2019   omeprazole 20 MG tablet Commonly known as: PRILOSEC OTC Take 1 tablet (20 mg total) by mouth daily.   oxyCODONE 5 MG immediate release tablet Commonly known as: Oxy IR/ROXICODONE Take 1-2 tablets (5-10 mg total) by mouth every 4 (four) hours as needed for moderate pain or severe pain.   OXYGEN Inhale 3 L into the lungs continuous.   Perforomist 20  MCG/2ML nebulizer solution Generic drug: formoterol Take 2 mLs (20 mcg total) by nebulization 2 (two) times daily.   predniSONE 20 MG tablet Commonly known as: DELTASONE Take 2 tablets (40 mg total) by mouth daily with breakfast. Start taking on: June 26, 2019   sertraline 100 MG tablet Commonly known as: ZOLOFT Take 1 tablet (100 mg total) by mouth daily.      Allergies  Allergen Reactions  . Codeine Hives and Nausea And Vomiting  . Shellfish Allergy Hives  . Penicillins Hives and Rash   Follow-up Information    Malfi, Jodelle Gross, FNP. Schedule an appointment as soon as possible for a visit in 2 week(s).   Specialty: Family Medicine Why: Follow-up with NP Danielle Rankin in 2 weeks acute on chronic respiratory failure with hypoxia, spontaneous LEFT pneumothorax Contact information: [address] [place] [address] [phone]            The results of significant diagnostics from this hospitalization (including imaging, microbiology, ancillary and laboratory) are listed below for reference.    Significant Diagnostic Studies: DG Chest 2 View  Result Date: 06/22/2019 CLINICAL DATA:  Shortness of breath. EXAM: CHEST - 2 VIEW COMPARISON:  Radiograph 12/06/2017 FINDINGS: Large left pneumothorax with near complete collapse of the left lung. No evidence of tension or mediastinal shift. The left lung is atelectatic. Hyperinflated right lung with emphysema. No pleural fluid or focal airspace disease. Heart is normal in size. No acute osseous abnormalities are seen. IMPRESSION: Large left pneumothorax with near complete collapse of the left lung. No evidence of tension. Critical Value/emergent results were called by telephone at the time of interpretation on 06/22/2019 at 12:33 am to Dr Lesly Rubenstein SUNG , who verbally acknowledged these results. Electronically Signed   By: Narda Rutherford M.D.   On: 06/22/2019 00:33   DG CHEST PORT 1 VIEW  Result Date: 06/25/2019 CLINICAL DATA:  Pneumothorax  EXAM: PORTABLE CHEST 1 VIEW COMPARISON:  None. FINDINGS: The heart size and mediastinal contours are within normal limits. Aortic knob calcifications are seen. No definite residual pneumothorax is noted. There is diffusely increased pulmonary vasculature throughout both lungs. A small left pleural effusion is present. IMPRESSION: No definite pneumothorax. Pulmonary vascular congestion and small left pleural effusion. Electronically Signed   By: Jonna Clark M.D.   On: 06/25/2019 05:47   DG Chest Port 1 View  Result Date: 06/24/2019 CLINICAL DATA:  Chest tube removal. EXAM: PORTABLE CHEST 1 VIEW COMPARISON:  Radiograph earlier this day. FINDINGS: Removal of left pigtail catheter. Slight lucency paralleling the left heart border may represent anterior pneumothorax, no pleural line is seen. Stable left basilar pleuroparenchymal opacity. Chronic hyperinflation and bronchial thickening unchanged. No new airspace disease. Stable heart size and mediastinal contours. IMPRESSION: 1. Removal of left pigtail catheter. Slight lucency paralleling the left heart border may represent small anterior pneumothorax or  be artifact. No pleural line is seen. Recommend follow-up radiograph. 2. Unchanged left basilar pleuroparenchymal opacity. Electronically Signed   By: Narda Rutherford M.D.   On: 06/24/2019 18:10   DG Chest Port 1 View  Result Date: 06/24/2019 CLINICAL DATA:  Chest tube placed to water seal EXAM: PORTABLE CHEST 1 VIEW COMPARISON:  Jun 24, 2019 FINDINGS: Chest tube position unchanged. No pneumothorax. There is a left pleural effusion with left base atelectasis. There is mild atelectatic change in the right base. No new opacity evident. Heart size and pulmonary vascularity are within normal limits. No evident adenopathy. No bone lesions. IMPRESSION: No change in chest tube position. No pneumothorax. Stable left pleural effusion. Bibasilar atelectasis. No new opacity. Stable cardiac silhouette. Electronically Signed    By: Bretta Bang III M.D.   On: 06/24/2019 13:08   DG Chest Port 1 View  Result Date: 06/24/2019 CLINICAL DATA:  Recent pneumothorax with chest tube placement EXAM: PORTABLE CHEST 1 VIEW COMPARISON:  Jun 23, 2019 FINDINGS: Chest tube present peripherally in the left lateral hemithorax, stable. No pneumothorax evident. There is a small left pleural effusion with left base atelectasis. There is mild atelectatic change in the right base. Heart size and pulmonary vascularity are within normal limits. No adenopathy. No bone lesions. IMPRESSION: No appreciable pneumothorax. Small left pleural effusion with left base atelectasis. There is also right base atelectasis. Stable cardiac silhouette. Electronically Signed   By: Bretta Bang III M.D.   On: 06/24/2019 07:53   DG Chest Port 1 View  Result Date: 06/23/2019 CLINICAL DATA:  Left pneumothorax with chest tube in place.  COPD. EXAM: PORTABLE CHEST 1 VIEW COMPARISON:  Chest radiograph from one day prior. FINDINGS: Stable peripheral left chest tube. Stable cardiomediastinal silhouette with normal heart size. No pneumothorax. No significant pleural effusions. Hyperinflated lungs. Emphysema. No pulmonary edema. Stable patchy left lung base opacity. IMPRESSION: 1. No pneumothorax. Stable peripheral left chest tube position. 2. Hyperinflated lungs and emphysema, compatible with COPD. 3. Stable patchy left lung base opacity, favor atelectasis. Electronically Signed   By: Delbert Phenix M.D.   On: 06/23/2019 07:17   DG Chest Port 1 View  Result Date: 06/22/2019 CLINICAL DATA:  Follow-up left pneumothorax EXAM: PORTABLE CHEST 1 VIEW COMPARISON:  06/22/2019 FINDINGS: Left chest tube is again noted although has withdrawn somewhat in the interval with only half of the sideholes within the chest cavity. No recurrent pneumothorax on the left is noted. Diffuse interstitial changes are again noted bilaterally consistent with chronic scarring. No new focal  abnormality is seen. Aortic calcifications are noted. Cardiac shadow is stable. IMPRESSION: Slight withdrawal of left chest tube with approximately half of the sideholes extrinsic to the ribcage. No recurrent pneumothorax is noted. Electronically Signed   By: Alcide Clever M.D.   On: 06/22/2019 13:08   DG Chest Portable 1 View  Result Date: 06/22/2019 CLINICAL DATA:  Chest tube placement EXAM: PORTABLE CHEST 1 VIEW COMPARISON:  Jun 22, 2019 FINDINGS: The heart size and mediastinal contours are within normal limits. There is interval placement of a left-sided pigtail catheter with re-expansion of the left lung. No definite residual pneumothorax is seen. Bilateral coarsened interstitial markings are seen throughout both lungs with hyperinflation of the upper lung zones. No pleural effusion. IMPRESSION: Interval placement of left-sided pigtail catheter with no residual pneumothorax seen. Electronically Signed   By: Jonna Clark M.D.   On: 06/22/2019 03:31    Microbiology: Recent Results (from the past 240 hour(s))  Culture,  blood (routine x 2)     Status: None (Preliminary result)   Collection Time: 06/22/19 12:05 AM   Specimen: BLOOD  Result Value Ref Range Status   Specimen Description BLOOD Blood Culture adequate volume  Final   Special Requests   Final    BOTTLES DRAWN AEROBIC AND ANAEROBIC BLOOD LEFT FOREARM   Culture   Final    NO GROWTH 3 DAYS Performed at [facility], [address] [place], [place] [address]    Report Status PENDING  Incomplete  SARS Coronavirus 2 by RT PCR (hospital order, performed in [place] hospital lab) Nasopharyngeal Nasopharyngeal Swab     Status: None   Collection Time: 06/22/19 12:05 AM   Specimen: Nasopharyngeal Swab  Result Value Ref Range Status   SARS Coronavirus 2 NEGATIVE NEGATIVE Final    Comment: (NOTE) SARS-CoV-2 target nucleic acids are NOT DETECTED. The SARS-CoV-2 RNA is generally detectable in upper and lower respiratory  specimens during the acute phase of infection. The lowest concentration of SARS-CoV-2 viral copies this assay can detect is 250 copies / mL. A negative result does not preclude SARS-CoV-2 infection and should not be used as the sole basis for treatment or other patient management decisions.  A negative result may occur with improper specimen collection / handling, submission of specimen other than nasopharyngeal swab, presence of viral mutation(s) within the areas targeted by this assay, and inadequate number of viral copies (<250 copies / mL). A negative result must be combined with clinical observations, patient history, and epidemiological information. Fact Sheet for Patients:   StrictlyIdeas.no Fact Sheet for Healthcare Providers: BankingDealers.co.za This test is not yet approved or cleared  by the [place] FDA and has been authorized for detection and/or diagnosis of SARS-CoV-2 by FDA under an Emergency Use Authorization (EUA).  This EUA will remain in effect (meaning this test can be used) for the duration of the COVID-19 declaration under Section 564(b)(1) of the Act, 21 U.S.C. section 360bbb-3(b)(1), unless the authorization is terminated or revoked sooner. Performed at [facility], [address] [place], [place] [address]   Culture, blood (routine x 2)     Status: None (Preliminary result)   Collection Time: 06/22/19 12:10 AM   Specimen: BLOOD  Result Value Ref Range Status   Specimen Description BLOOD Blood Culture adequate volume  Final   Special Requests   Final    BOTTLES DRAWN AEROBIC AND ANAEROBIC RESISTANT WRIST   Culture   Final    NO GROWTH 3 DAYS Performed at [facility], [place], [place] [address]    Report Status PENDING  Incomplete     Labs: Basic Metabolic Panel: Recent Labs  Lab 06/21/19 2323 06/22/19 0924 06/23/19 0602 06/24/19 0729 06/25/19 0410  NA 134*  135 135 135 140  K 3.9 4.5 3.7 3.8 3.6  CL 83* 84* 85* 89* 95*  CO2 39* 40* 37* 35* 37*  GLUCOSE 126* 200* 102* 97 91  BUN 16 21* 23* 33* 27*  CREATININE 0.74 0.79 0.73 0.96 0.89  CALCIUM 9.8 9.1 9.3 9.2 8.9  MG  --  2.1 2.1 2.0 2.1  PHOS  --  5.7* 2.9 2.6 3.7   Liver Function Tests: Recent Labs  Lab 06/22/19 0924 06/23/19 0602 06/24/19 0729 06/25/19 0410  AST 21 18 30  33  ALT 17 14 19 20   ALKPHOS 61 53 70 52  BILITOT 0.6 0.8 0.6 0.5  PROT 7.8 6.9 6.3* 6.3*  ALBUMIN  4.1 3.6 3.3* 3.4*   No results for input(s): LIPASE, AMYLASE in the last 168 hours. No results for input(s): AMMONIA in the last 168 hours. CBC: Recent Labs  Lab 06/21/19 2323 06/22/19 0924 06/23/19 0602 06/24/19 0729 06/25/19 0410  WBC 13.7* 11.3* 11.0* 12.1* 8.9  NEUTROABS 11.5* 10.8* 8.7* 9.3* 7.0  HGB 13.8 12.7 11.8* 11.9* 10.9*  HCT 43.3 40.6 36.6 36.3 34.5*  MCV 93.5 93.8 91.3 89.0 92.7  PLT 272 220 229 248 318   Cardiac Enzymes: No results for input(s): CKTOTAL, CKMB, CKMBINDEX, TROPONINI in the last 168 hours. BNP: BNP (last 3 results) No results for input(s): BNP in the last 8760 hours.  ProBNP (last 3 results) No results for input(s): PROBNP in the last 8760 hours.  CBG: No results for input(s): GLUCAP in the last 168 hours.     Signed:  Carolyne Littlesurtis Kaitlin Alcindor, MD [facility] [phone] pager

## 2019-06-25 NOTE — Progress Notes (Signed)
Alicia Gibson and O x4. VSS. Pt tolerating diet well. No complaints of nausea or vomiting. IV removed intact, prescriptions given. Pt voices understanding of discharge instructions with no further questions. Patient discharged via wheelchair with RN with RW  Allergies as of 06/25/2019      Reactions   Codeine Hives, Nausea And Vomiting   Shellfish Allergy Hives   Penicillins Hives, Rash      Medication List    STOP taking these medications   omeprazole 20 MG capsule Commonly known as: PRILOSEC     TAKE these medications   albuterol (2.5 MG/3ML) 0.083% nebulizer solution Commonly known as: PROVENTIL Take 3 mLs (2.5 mg total) by nebulization 4 (four) times daily. What changed: Another medication with the same name was changed. Make sure you understand how and when to take each.   albuterol 108 (90 Base) MCG/ACT inhaler Commonly known as: VENTOLIN HFA INHALE 2 PUFFS INTO THE LUNGS EVERY 6 HOURS AS NEEDED FOR WHEEZING OR SHORTNESS OF BREATH What changed: See the new instructions.   amLODipine 10 MG tablet Commonly known as: NORVASC Take 1 tablet (10 mg total) by mouth daily. Start taking on: June 26, 2019 What changed:   medication strength  how much to take   aspirin EC 81 MG tablet Take 81 mg by mouth daily.   atorvastatin 40 MG tablet Commonly known as: LIPITOR TAKE 1 TABLET(40 MG) BY MOUTH DAILY What changed: See the new instructions.   budesonide 0.5 MG/2ML nebulizer solution Commonly known as: PULMICORT Take 2 mLs (0.5 mg total) by nebulization 2 (two) times daily.   cetirizine 10 MG tablet Commonly known as: ZYRTEC Take 1 tablet (10 mg total) by mouth daily.   cyclobenzaprine 10 MG tablet Commonly known as: FLEXERIL TAKE 1 TABLET BY MOUTH 2 TIMES DAILY AS NEEDED FOR MUSCLE SPASMS What changed:   how much to take  how to take this  when to take this  reasons to take this  additional instructions   hydrOXYzine 25 MG tablet Commonly known as:  ATARAX/VISTARIL Take 1 tablet (25 mg total) by mouth 3 (three) times daily as needed for anxiety. What changed:   medication strength  how much to take  how to take this  when to take this  reasons to take this  additional instructions   levofloxacin 750 MG tablet Commonly known as: LEVAQUIN Take 1 tablet (750 mg total) by mouth daily. Start taking on: June 26, 2019   Mucinex 600 MG 12 hr tablet Generic drug: guaiFENesin Take 2 tablets (1,200 mg total) by mouth 2 (two) times daily as needed for cough or to loosen phlegm.   multivitamin with minerals Tabs tablet Take 1 tablet by mouth daily.   nicotine 21 mg/24hr patch Commonly known as: NICODERM CQ - dosed in mg/24 hours Place 1 patch (21 mg total) onto the skin daily. Start taking on: June 26, 2019   omeprazole 20 MG tablet Commonly known as: PRILOSEC OTC Take 1 tablet (20 mg total) by mouth daily.   oxyCODONE 5 MG immediate release tablet Commonly known as: Oxy IR/ROXICODONE Take 1-2 tablets (5-10 mg total) by mouth every 4 (four) hours as needed for moderate pain or severe pain.   OXYGEN Inhale 3 L into the lungs continuous.   Perforomist 20 MCG/2ML nebulizer solution Generic drug: formoterol Take 2 mLs (20 mcg total) by nebulization 2 (two) times daily.   predniSONE 20 MG tablet Commonly known as: DELTASONE Take 2 tablets (40 mg total)  by mouth daily with breakfast. Start taking on: June 26, 2019   sertraline 100 MG tablet Commonly known as: ZOLOFT Take 1 tablet (100 mg total) by mouth daily.            Durable Medical Equipment  (From admission, onward)         Start     Ordered   06/25/19 1602  For home use only DME Walker rolling  Once    Question Answer Comment  Walker: With 5 Inch Wheels   Patient needs Gibson walker to treat with the following condition Weakness      06/25/19 1601          Vitals:   06/25/19 1237 06/25/19 1407  BP: 112/63   Pulse: (!) 101 (!) 102  Resp:  18  Temp:  97.6 F (36.4 C)   SpO2: 91% 92%    Alicia Gibson

## 2019-06-26 ENCOUNTER — Telehealth: Payer: Self-pay

## 2019-06-26 NOTE — Telephone Encounter (Signed)
  Patient was recently discharged from the hospital on 06/25/2019  No TCM completed, patient does not qualify for TCM services due to insurance coverage/medicaid  Per discharge summary patient needs follow up with PCP, Scheduled for 07/09/2019

## 2019-06-26 NOTE — Telephone Encounter (Signed)
Received fax from pharmacy for refill request for Prednisone 20mg . Patient has not been seen since 08/01/2018 and looking at the notes she was supposed to take 40mg  daily for 10 days. Is the patient supposed to be on this medication still?  Please advise

## 2019-06-27 LAB — CULTURE, BLOOD (ROUTINE X 2)
Culture: NO GROWTH
Culture: NO GROWTH
Specimen Description: ADEQUATE
Specimen Description: ADEQUATE

## 2019-07-05 NOTE — Telephone Encounter (Signed)
Please advise on previous message.  

## 2019-07-08 NOTE — Telephone Encounter (Signed)
Pt was last seen in office on 08/01/2018.

## 2019-07-08 NOTE — Telephone Encounter (Signed)
Hello, sorry for late reply, can you tell me when she was last assessed, I think she will need to revisit this question with OV with any provider

## 2019-07-09 ENCOUNTER — Telehealth: Payer: Self-pay | Admitting: Primary Care

## 2019-07-09 ENCOUNTER — Ambulatory Visit
Admission: RE | Admit: 2019-07-09 | Discharge: 2019-07-09 | Disposition: A | Discharge status: Home / Self Care | Payer: Medicaid Other | Attending: Family Medicine | Admitting: Family Medicine

## 2019-07-09 ENCOUNTER — Other Ambulatory Visit: Payer: Self-pay

## 2019-07-09 ENCOUNTER — Ambulatory Visit (INDEPENDENT_AMBULATORY_CARE_PROVIDER_SITE_OTHER): Payer: Medicaid Other | Admitting: Family Medicine

## 2019-07-09 ENCOUNTER — Ambulatory Visit
Admission: RE | Admit: 2019-07-09 | Discharge: 2019-07-09 | Disposition: A | Discharge status: Home / Self Care | Payer: Medicaid Other | Source: Ambulatory Visit | Attending: Family Medicine | Admitting: Family Medicine

## 2019-07-09 ENCOUNTER — Encounter: Payer: Self-pay | Admitting: Family Medicine

## 2019-07-09 VITALS — BP 122/62 | HR 89 | Temp 97.8°F | Resp 20 | Ht 66.0 in | Wt 141.0 lb

## 2019-07-09 DIAGNOSIS — J90 Pleural effusion, not elsewhere classified: Secondary | ICD-10-CM | POA: Diagnosis not present

## 2019-07-09 DIAGNOSIS — J449 Chronic obstructive pulmonary disease, unspecified: Secondary | ICD-10-CM | POA: Diagnosis not present

## 2019-07-09 DIAGNOSIS — I10 Essential (primary) hypertension: Secondary | ICD-10-CM | POA: Diagnosis not present

## 2019-07-09 DIAGNOSIS — J939 Pneumothorax, unspecified: Secondary | ICD-10-CM

## 2019-07-09 DIAGNOSIS — Z515 Encounter for palliative care: Secondary | ICD-10-CM | POA: Insufficient documentation

## 2019-07-09 DIAGNOSIS — J9611 Chronic respiratory failure with hypoxia: Secondary | ICD-10-CM

## 2019-07-09 DIAGNOSIS — J439 Emphysema, unspecified: Secondary | ICD-10-CM

## 2019-07-09 DIAGNOSIS — Z789 Other specified health status: Secondary | ICD-10-CM | POA: Diagnosis not present

## 2019-07-09 MED ORDER — PREDNISONE 10 MG PO TABS: ORAL_TABLET | ORAL | 0 refills | Status: DC

## 2019-07-09 NOTE — Progress Notes (Signed)
Subjective:    Patient ID: Alicia Gibson, female    DOB: 05-22-1960, 59 y.o.   MRN: [id number]  Alicia Gibson is a 59 y.o. female presenting on 07/09/2019 for Hospitalization Follow-up (acute on chronic respiratory failure with hypoxia, spontaneous LEFT pneumothorax. x 14 days. Pt breathing seems to have worsen since discharge, fatigue and coughing up blood (brownish, blood phelgm). Pt complaining of some spasms in the left lung and even some discomfort in the Rt lung. )   HPI  Alicia Gibson presents to clinic for follow up visit after having been hospitalized with [facility] on 05/29/20201 with a COPD exacerbation and left spontaneous pneumothorax.  Reports since discharge she has only felt worse, increased work of breathing, fatigue, coughing up brown phlegm, with some left lung spasms.  Has been wearing her continuous oxygen at 3L via nasal cannula.  Has not yet scheduled an appointment for follow up with pulmonary.  Has completed the prednisone and levaquin that was written for her at time of discharge.  Denies dizziness, lightheadedness, visual changes, DOE, difficulty catching her breath.  Has caregiver here in office with her today.  Depression screen [facility] 2/9 05/27/2019 11/13/2018 08/13/2018  Decreased Interest 1 0 0  Down, Depressed, Hopeless 1 0 1  PHQ - 2 Score 2 0 1  Altered sleeping 1 1 0  Tired, decreased energy 1 1 1   Change in appetite 0 0 0  Feeling bad or failure about yourself  0 0 0  Trouble concentrating 0 0 0  Moving slowly or fidgety/restless 1 0 1  Suicidal thoughts 0 0 0  PHQ-9 Score 5 2 3   Difficult doing work/chores Not difficult at all Not difficult at all Not difficult at all    Social History   Tobacco Use  . Smoking status: Former Smoker    Packs/day: 0.10    Types: Cigarettes  . Smokeless tobacco: Never Used  Vaping Use  . Vaping Use: Never used  Substance Use Topics  . Alcohol use: Never  . Drug use: Never    Review of Systems  Constitutional: Positive  for fatigue. Negative for activity change, appetite change, chills, diaphoresis, fever and unexpected weight change.  HENT: Negative.   Eyes: Negative.   Respiratory: Positive for cough, shortness of breath and wheezing. Negative for apnea, choking, chest tightness and stridor.   Cardiovascular: Negative.   Gastrointestinal: Negative.   Endocrine: Negative.   Genitourinary: Negative.   Musculoskeletal: Negative.   Skin: Negative.   Allergic/Immunologic: Negative.   Neurological: Negative.   Hematological: Negative.   Psychiatric/Behavioral: Negative.    Per HPI unless specifically indicated above     Objective:    BP 122/62 (BP Location: Right Arm, Patient Position: Sitting, Cuff Size: Normal)   Pulse 89   Temp 97.8 F (36.6 C) (Temporal)   Resp 20   Ht 5\' 6"  (1.676 m)   Wt 141 lb (64 kg)   SpO2 91%   BMI 22.76 kg/m   Wt Readings from Last 3 Encounters:  07/09/19 141 lb (64 kg)  06/21/19 130 lb (59 kg)  04/02/18 115 lb 6.4 oz (52.3 kg)    Physical Exam Vitals reviewed.  Constitutional:      General: She is not in acute distress.    Appearance: Normal appearance. She is normal weight. She is not ill-appearing or toxic-appearing.  HENT:     Head: Normocephalic and atraumatic.     Nose:     Comments: 07/11/19 is in place, covering mouth  and nose  Eyes:     General: Lids are normal. Vision grossly intact.        Right eye: No discharge.        Left eye: No discharge.     Extraocular Movements: Extraocular movements intact.     Conjunctiva/sclera: Conjunctivae normal.     Pupils: Pupils are equal, round, and reactive to light.  Cardiovascular:     Rate and Rhythm: Normal rate and regular rhythm.     Pulses: Normal pulses.     Heart sounds: Normal heart sounds. No murmur heard.  No friction rub. No gallop.   Pulmonary:     Effort: Pulmonary effort is normal. Prolonged expiration present. No tachypnea, bradypnea, respiratory distress or retractions.     Breath  sounds: No stridor. Wheezing present. No rhonchi or rales.     Comments: Diffuse wheezing on exhalation Musculoskeletal:     Right lower leg: No edema.     Left lower leg: No edema.  Skin:    General: Skin is warm and dry.     Capillary Refill: Capillary refill takes less than 2 seconds.  Neurological:     General: No focal deficit present.     Mental Status: She is alert and oriented to person, place, and time.     Cranial Nerves: No cranial nerve deficit.     Sensory: No sensory deficit.     Motor: No weakness.     Coordination: Coordination normal.     Gait: Gait normal.  Psychiatric:        Attention and Perception: Attention and perception normal.        Mood and Affect: Mood and affect normal.        Speech: Speech normal.        Behavior: Behavior normal. Behavior is cooperative.        Thought Content: Thought content normal.        Cognition and Memory: Cognition and memory normal.        Judgment: Judgment normal.    Results for orders placed or performed during the hospital encounter of 06/22/19  Culture, blood (routine x 2)   Specimen: BLOOD  Result Value Ref Range   Specimen Description BLOOD Blood Culture adequate volume    Special Requests      BOTTLES DRAWN AEROBIC AND ANAEROBIC BLOOD LEFT FOREARM   Culture      NO GROWTH 5 DAYS Performed at [facility], [address] [place], [place] [address]    Report Status 06/27/2019 FINAL   Culture, blood (routine x 2)   Specimen: BLOOD  Result Value Ref Range   Specimen Description BLOOD Blood Culture adequate volume    Special Requests      BOTTLES DRAWN AEROBIC AND ANAEROBIC RESISTANT WRIST   Culture      NO GROWTH 5 DAYS Performed at [facility], [address], [place], [place] [address]    Report Status 06/27/2019 FINAL   SARS Coronavirus 2 by RT PCR (hospital order, performed in [facility] hospital lab) Nasopharyngeal Nasopharyngeal Swab   Specimen: Nasopharyngeal Swab  Result  Value Ref Range   SARS Coronavirus 2 NEGATIVE NEGATIVE  CBC with Differential  Result Value Ref Range   WBC 13.7 (H) 4.0 - 10.5 K/uL   RBC 4.63 3.87 - 5.11 MIL/uL   Hemoglobin 13.8 12.0 - 15.0 g/dL   HCT 78.4 36 - 46 %   MCV 93.5 80.0 - 100.0 fL   MCH  29.8 26.0 - 34.0 pg   MCHC 31.9 30.0 - 36.0 g/dL   RDW 13.0 11.5 - 15.5 %   Platelets 272 150 - 400 K/uL   nRBC 0.0 0.0 - 0.2 %   Neutrophils Relative % 83 %   Neutro Abs 11.5 (H) 1.7 - 7.7 K/uL   Lymphocytes Relative 7 %   Lymphs Abs 1.0 0.7 - 4.0 K/uL   Monocytes Relative 8 %   Monocytes Absolute 1.1 (H) 0 - 1 K/uL   Eosinophils Relative 1 %   Eosinophils Absolute 0.1 0 - 0 K/uL   Basophils Relative 0 %   Basophils Absolute 0.1 0 - 0 K/uL   Immature Granulocytes 1 %   Abs Immature Granulocytes 0.07 0.00 - 0.07 K/uL  Basic metabolic panel  Result Value Ref Range   Sodium 134 (L) 135 - 145 mmol/L   Potassium 3.9 3.5 - 5.1 mmol/L   Chloride 83 (L) 98 - 111 mmol/L   CO2 39 (H) 22 - 32 mmol/L   Glucose, Bld 126 (H) 70 - 99 mg/dL   BUN 16 6 - 20 mg/dL   Creatinine, Ser 0.74 0.44 - 1.00 mg/dL   Calcium 9.8 8.9 - 10.3 mg/dL   GFR calc non Af Amer >60 >60 mL/min   GFR calc Af Amer >60 >60 mL/min   Anion gap 12 5 - 15  Lactic acid, plasma  Result Value Ref Range   Lactic Acid, Venous 1.0 0.5 - 1.9 mmol/L  Comprehensive metabolic panel  Result Value Ref Range   Sodium 135 135 - 145 mmol/L   Potassium 4.5 3.5 - 5.1 mmol/L   Chloride 84 (L) 98 - 111 mmol/L   CO2 40 (H) 22 - 32 mmol/L   Glucose, Bld 200 (H) 70 - 99 mg/dL   BUN 21 (H) 6 - 20 mg/dL   Creatinine, Ser 0.79 0.44 - 1.00 mg/dL   Calcium 9.1 8.9 - 10.3 mg/dL   Total Protein 7.8 6.5 - 8.1 g/dL   Albumin 4.1 3.5 - 5.0 g/dL   AST 21 15 - 41 U/L   ALT 17 0 - 44 U/L   Alkaline Phosphatase 61 38 - 126 U/L   Total Bilirubin 0.6 0.3 - 1.2 mg/dL   GFR calc non Af Amer >60 >60 mL/min   GFR calc Af Amer >60 >60 mL/min   Anion gap 11 5 - 15  Magnesium  Result Value Ref  Range   Magnesium 2.1 1.7 - 2.4 mg/dL  Phosphorus  Result Value Ref Range   Phosphorus 5.7 (H) 2.5 - 4.6 mg/dL  CBC with Differential/Platelet  Result Value Ref Range   WBC 11.3 (H) 4.0 - 10.5 K/uL   RBC 4.33 3.87 - 5.11 MIL/uL   Hemoglobin 12.7 12.0 - 15.0 g/dL   HCT 40.6 36 - 46 %   MCV 93.8 80.0 - 100.0 fL   MCH 29.3 26.0 - 34.0 pg   MCHC 31.3 30.0 - 36.0 g/dL   RDW 12.8 11.5 - 15.5 %   Platelets 220 150 - 400 K/uL   nRBC 0.0 0.0 - 0.2 %   Neutrophils Relative % 96 %   Neutro Abs 10.8 (H) 1.7 - 7.7 K/uL   Lymphocytes Relative 2 %   Lymphs Abs 0.3 (L) 0.7 - 4.0 K/uL   Monocytes Relative 2 %   Monocytes Absolute 0.2 0 - 1 K/uL   Eosinophils Relative 0 %   Eosinophils Absolute 0.0 0 - 0 K/uL  Basophils Relative 0 %   Basophils Absolute 0.0 0 - 0 K/uL   Immature Granulocytes 0 %   Abs Immature Granulocytes 0.05 0.00 - 0.07 K/uL  Lactic acid, plasma  Result Value Ref Range   Lactic Acid, Venous 1.1 0.5 - 1.9 mmol/L  Lactic acid, plasma  Result Value Ref Range   Lactic Acid, Venous 1.2 0.5 - 1.9 mmol/L  HIV Antibody (routine testing w rflx)  Result Value Ref Range   HIV Screen 4th Generation wRfx Non Reactive Non Reactive  Comprehensive metabolic panel  Result Value Ref Range   Sodium 135 135 - 145 mmol/L   Potassium 3.7 3.5 - 5.1 mmol/L   Chloride 85 (L) 98 - 111 mmol/L   CO2 37 (H) 22 - 32 mmol/L   Glucose, Bld 102 (H) 70 - 99 mg/dL   BUN 23 (H) 6 - 20 mg/dL   Creatinine, Ser 8.91 0.44 - 1.00 mg/dL   Calcium 9.3 8.9 - 69.4 mg/dL   Total Protein 6.9 6.5 - 8.1 g/dL   Albumin 3.6 3.5 - 5.0 g/dL   AST 18 15 - 41 U/L   ALT 14 0 - 44 U/L   Alkaline Phosphatase 53 38 - 126 U/L   Total Bilirubin 0.8 0.3 - 1.2 mg/dL   GFR calc non Af Amer >60 >60 mL/min   GFR calc Af Amer >60 >60 mL/min   Anion gap 13 5 - 15  CBC with Differential/Platelet  Result Value Ref Range   WBC 11.0 (H) 4.0 - 10.5 K/uL   RBC 4.01 3.87 - 5.11 MIL/uL   Hemoglobin 11.8 (L) 12.0 - 15.0 g/dL    HCT 50.3 36 - 46 %   MCV 91.3 80.0 - 100.0 fL   MCH 29.4 26.0 - 34.0 pg   MCHC 32.2 30.0 - 36.0 g/dL   RDW 88.8 28.0 - 03.4 %   Platelets 229 150 - 400 K/uL   nRBC 0.0 0.0 - 0.2 %   Neutrophils Relative % 79 %   Neutro Abs 8.7 (H) 1.7 - 7.7 K/uL   Lymphocytes Relative 10 %   Lymphs Abs 1.1 0.7 - 4.0 K/uL   Monocytes Relative 9 %   Monocytes Absolute 1.0 0 - 1 K/uL   Eosinophils Relative 1 %   Eosinophils Absolute 0.1 0 - 0 K/uL   Basophils Relative 0 %   Basophils Absolute 0.0 0 - 0 K/uL   Immature Granulocytes 1 %   Abs Immature Granulocytes 0.05 0.00 - 0.07 K/uL  Magnesium  Result Value Ref Range   Magnesium 2.1 1.7 - 2.4 mg/dL  Phosphorus  Result Value Ref Range   Phosphorus 2.9 2.5 - 4.6 mg/dL  Comprehensive metabolic panel  Result Value Ref Range   Sodium 135 135 - 145 mmol/L   Potassium 3.8 3.5 - 5.1 mmol/L   Chloride 89 (L) 98 - 111 mmol/L   CO2 35 (H) 22 - 32 mmol/L   Glucose, Bld 97 70 - 99 mg/dL   BUN 33 (H) 6 - 20 mg/dL   Creatinine, Ser 9.17 0.44 - 1.00 mg/dL   Calcium 9.2 8.9 - 91.5 mg/dL   Total Protein 6.3 (L) 6.5 - 8.1 g/dL   Albumin 3.3 (L) 3.5 - 5.0 g/dL   AST 30 15 - 41 U/L   ALT 19 0 - 44 U/L   Alkaline Phosphatase 70 38 - 126 U/L   Total Bilirubin 0.6 0.3 - 1.2 mg/dL   GFR calc non Af Amer >  60 >60 mL/min   GFR calc Af Amer >60 >60 mL/min   Anion gap 11 5 - 15  CBC with Differential/Platelet  Result Value Ref Range   WBC 12.1 (H) 4.0 - 10.5 K/uL   RBC 4.08 3.87 - 5.11 MIL/uL   Hemoglobin 11.9 (L) 12.0 - 15.0 g/dL   HCT 54.6 36 - 46 %   MCV 89.0 80.0 - 100.0 fL   MCH 29.2 26.0 - 34.0 pg   MCHC 32.8 30.0 - 36.0 g/dL   RDW 27.0 35.0 - 09.3 %   Platelets 248 150 - 400 K/uL   nRBC 0.0 0.0 - 0.2 %   Neutrophils Relative % 75 %   Neutro Abs 9.3 (H) 1.7 - 7.7 K/uL   Lymphocytes Relative 13 %   Lymphs Abs 1.6 0.7 - 4.0 K/uL   Monocytes Relative 7 %   Monocytes Absolute 0.8 0 - 1 K/uL   Eosinophils Relative 3 %   Eosinophils Absolute 0.3 0 - 0  K/uL   Basophils Relative 1 %   Basophils Absolute 0.1 0 - 0 K/uL   Immature Granulocytes 1 %   Abs Immature Granulocytes 0.07 0.00 - 0.07 K/uL  Magnesium  Result Value Ref Range   Magnesium 2.0 1.7 - 2.4 mg/dL  Phosphorus  Result Value Ref Range   Phosphorus 2.6 2.5 - 4.6 mg/dL  Comprehensive metabolic panel  Result Value Ref Range   Sodium 140 135 - 145 mmol/L   Potassium 3.6 3.5 - 5.1 mmol/L   Chloride 95 (L) 98 - 111 mmol/L   CO2 37 (H) 22 - 32 mmol/L   Glucose, Bld 91 70 - 99 mg/dL   BUN 27 (H) 6 - 20 mg/dL   Creatinine, Ser 8.18 0.44 - 1.00 mg/dL   Calcium 8.9 8.9 - 29.9 mg/dL   Total Protein 6.3 (L) 6.5 - 8.1 g/dL   Albumin 3.4 (L) 3.5 - 5.0 g/dL   AST 33 15 - 41 U/L   ALT 20 0 - 44 U/L   Alkaline Phosphatase 52 38 - 126 U/L   Total Bilirubin 0.5 0.3 - 1.2 mg/dL   GFR calc non Af Amer >60 >60 mL/min   GFR calc Af Amer >60 >60 mL/min   Anion gap 8 5 - 15  CBC with Differential/Platelet  Result Value Ref Range   WBC 8.9 4.0 - 10.5 K/uL   RBC 3.72 (L) 3.87 - 5.11 MIL/uL   Hemoglobin 10.9 (L) 12.0 - 15.0 g/dL   HCT 37.1 (L) 36 - 46 %   MCV 92.7 80.0 - 100.0 fL   MCH 29.3 26.0 - 34.0 pg   MCHC 31.6 30.0 - 36.0 g/dL   RDW 69.6 78.9 - 38.1 %   Platelets 318 150 - 400 K/uL   nRBC 0.0 0.0 - 0.2 %   Neutrophils Relative % 78 %   Neutro Abs 7.0 1.7 - 7.7 K/uL   Lymphocytes Relative 12 %   Lymphs Abs 1.1 0.7 - 4.0 K/uL   Monocytes Relative 7 %   Monocytes Absolute 0.7 0 - 1 K/uL   Eosinophils Relative 2 %   Eosinophils Absolute 0.2 0 - 0 K/uL   Basophils Relative 0 %   Basophils Absolute 0.0 0 - 0 K/uL   Immature Granulocytes 1 %   Abs Immature Granulocytes 0.06 0.00 - 0.07 K/uL  Magnesium  Result Value Ref Range   Magnesium 2.1 1.7 - 2.4 mg/dL  Phosphorus  Result Value Ref Range  Phosphorus 3.7 2.5 - 4.6 mg/dL  Troponin I (High Sensitivity)  Result Value Ref Range   Troponin I (High Sensitivity) 70 (H) <18 ng/L  Troponin I (High Sensitivity)  Result Value  Ref Range   Troponin I (High Sensitivity) 60 (H) <18 ng/L      Assessment & Plan:   Problem List Items Addressed This Visit      Cardiovascular and Mediastinum   Essential hypertension   Relevant Orders   Ambulatory referral to Chronic Care Management Services   Amb Referral to Palliative Care     Respiratory   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 oxygen.  [facility] with spontaneous left pneumothorax and COPD exacerbation.  Unsure what her oxygen percentage is at home, currently 91% here and reports this has been her approximate baseline since discharge.  Completed antibiotics and steroids without improvement in her fatigue, increased work of breathing or coughing up brown sputum.  Has not contacted her pulmonologist for follow up visit, last visit with them 07/2018.    Plan: 1. Chest xray to be taken today and will contact once results are received 2. To begin Prednisone taper over the next 9 days 3. Referral coordinator scheduled pulmonology appointment for Ms. Spacek for tomorrow at 11:00am [facility] [place] 4. Palliative care referral placed 5. CCM referral placed 6. Continue all medications as directed 7. Follow up with us in 3 months      Relevant Medications   predniSONE (DELTASONE) 10 MG tablet   Other Relevant Orders   Ambulatory referral to Pulmonology   DG Chest 2 View   Ambulatory referral to Chronic Care Management Services   Amb Referral to Palliative Care   Pulmonary emphysema (HCC)   Relevant Medications   predniSONE (DELTASONE) 10 MG tablet   Other Relevant Orders   Ambulatory referral to Chronic Care Management Services   Amb Referral to Palliative Care   Chronic respiratory failure [facility](HCC)   Relevant Orders   Ambulatory referral to Chronic Care Management Services   Amb Referral to Palliative Care   Pneumothorax on left   Relevant  Orders   Ambulatory referral to Pulmonology   DG Chest 2 View   Ambulatory referral to Chronic Care Management Services   Amb Referral to Palliative Care     Other   Palliative care patient   Enrolled in chronic care management      Meds ordered this encounter  Medications  . predniSONE (DELTASONE) 10 MG tablet    Sig: Take 3 tablets (30 mg total) by mouth daily with breakfast for 3 days, THEN 2 tablets (20 mg total) daily with breakfast for 3 days, THEN 1 tablet (10 mg total) daily with breakfast for 3 days.    Dispense:  18 tablet    Refill:  0      Follow up plan: Return in about 3 months (around 10/09/2019) for HTN & COPD F/U.   Charlaine DaltonNicole Marie Aubreanna Percle, FNP Family Nurse Practitioner [facility] 07/09/2019, 12:45 PM

## 2019-07-09 NOTE — Patient Instructions (Addendum)
Keep your appointment with [facility] for tomorrow. Phone: [phone]  We will contact you once your xray has resulted.  I have sent in a prescription for Prednisone to take 30mg  for the next 3 days, then 20mg  for 3 days and then down to 10mg  for 3 days.    A referral to palliative care has been placed today.  If you have not heard from the specialty office or our referral coordinator within 1 week, please let know and we will follow up with the referral coordinator for an update.  We will plan to see you back in 3 months for hypertension and COPD follow up  You will receive a survey after today's visit either digitally by e-mail or paper by USPS mail. Your experiences and feedback matter to .  Please respond so we know how we are doing as we provide care for you.  Call with any questions/concerns/needs.  It is my goal to be available to you for your health concerns.  Thanks for choosing me to be a partner in your healthcare needs!  [name], FNP-C Family Nurse Practitioner [facility] Phone: [phone]

## 2019-07-09 NOTE — Assessment & Plan Note (Addendum)
Continuous 3L via [place] oxygen.  Recently discharged from [facility] >2 weeks ago with spontaneous left pneumothorax and COPD exacerbation.  Unsure what her oxygen percentage is at home, currently 91% here and reports this has been her approximate baseline since discharge.  Completed antibiotics and steroids without improvement in her fatigue, increased work of breathing or coughing up brown sputum.  Has not contacted her pulmonologist for follow up visit, last visit with them 07/2018.    Plan: 1. Chest xray to be taken today and will contact once results are received 2. To begin Prednisone taper over the next 9 days 3. Referral coordinator scheduled pulmonology appointment for Alicia Gibson for tomorrow at 11:00am with [facility] 4. Palliative care referral placed 5. CCM referral placed 6. Continue all medications as directed 7. Follow up with [name] in 3 months

## 2019-07-10 ENCOUNTER — Observation Stay: Payer: Medicaid Other

## 2019-07-10 ENCOUNTER — Inpatient Hospital Stay
Admission: EM | Admit: 2019-07-10 | Discharge: 2019-07-13 | DRG: 190 | Disposition: A | Discharge status: Home Health | Payer: Medicaid Other | Attending: Internal Medicine | Admitting: Internal Medicine

## 2019-07-10 ENCOUNTER — Other Ambulatory Visit: Payer: Self-pay

## 2019-07-10 ENCOUNTER — Ambulatory Visit (INDEPENDENT_AMBULATORY_CARE_PROVIDER_SITE_OTHER): Payer: Disability Insurance | Admitting: Pulmonary Disease

## 2019-07-10 ENCOUNTER — Encounter: Payer: Self-pay | Admitting: Pulmonary Disease

## 2019-07-10 ENCOUNTER — Emergency Department: Payer: Medicaid Other

## 2019-07-10 ENCOUNTER — Encounter: Payer: Self-pay | Admitting: Emergency Medicine

## 2019-07-10 ENCOUNTER — Encounter: Payer: Self-pay | Admitting: Internal Medicine

## 2019-07-10 DIAGNOSIS — Z91041 Radiographic dye allergy status: Secondary | ICD-10-CM

## 2019-07-10 DIAGNOSIS — J439 Emphysema, unspecified: Secondary | ICD-10-CM | POA: Diagnosis not present

## 2019-07-10 DIAGNOSIS — E785 Hyperlipidemia, unspecified: Secondary | ICD-10-CM | POA: Diagnosis present

## 2019-07-10 DIAGNOSIS — J90 Pleural effusion, not elsewhere classified: Secondary | ICD-10-CM | POA: Diagnosis present

## 2019-07-10 DIAGNOSIS — J40 Bronchitis, not specified as acute or chronic: Secondary | ICD-10-CM | POA: Diagnosis not present

## 2019-07-10 DIAGNOSIS — F419 Anxiety disorder, unspecified: Secondary | ICD-10-CM | POA: Diagnosis present

## 2019-07-10 DIAGNOSIS — Z880 Allergy status to penicillin: Secondary | ICD-10-CM

## 2019-07-10 DIAGNOSIS — Z720 Tobacco use: Secondary | ICD-10-CM

## 2019-07-10 DIAGNOSIS — Z7189 Other specified counseling: Secondary | ICD-10-CM | POA: Diagnosis not present

## 2019-07-10 DIAGNOSIS — Z9981 Dependence on supplemental oxygen: Secondary | ICD-10-CM

## 2019-07-10 DIAGNOSIS — Z91013 Allergy to seafood: Secondary | ICD-10-CM

## 2019-07-10 DIAGNOSIS — J9611 Chronic respiratory failure with hypoxia: Secondary | ICD-10-CM | POA: Diagnosis not present

## 2019-07-10 DIAGNOSIS — Z7952 Long term (current) use of systemic steroids: Secondary | ICD-10-CM

## 2019-07-10 DIAGNOSIS — J9621 Acute and chronic respiratory failure with hypoxia: Secondary | ICD-10-CM

## 2019-07-10 DIAGNOSIS — Z811 Family history of alcohol abuse and dependence: Secondary | ICD-10-CM

## 2019-07-10 DIAGNOSIS — Z7951 Long term (current) use of inhaled steroids: Secondary | ICD-10-CM

## 2019-07-10 DIAGNOSIS — Z79899 Other long term (current) drug therapy: Secondary | ICD-10-CM

## 2019-07-10 DIAGNOSIS — J441 Chronic obstructive pulmonary disease with (acute) exacerbation: Secondary | ICD-10-CM | POA: Diagnosis not present

## 2019-07-10 DIAGNOSIS — A419 Sepsis, unspecified organism: Secondary | ICD-10-CM | POA: Diagnosis not present

## 2019-07-10 DIAGNOSIS — Z20822 Contact with and (suspected) exposure to covid-19: Secondary | ICD-10-CM | POA: Diagnosis not present

## 2019-07-10 DIAGNOSIS — F329 Major depressive disorder, single episode, unspecified: Secondary | ICD-10-CM | POA: Diagnosis not present

## 2019-07-10 DIAGNOSIS — J9610 Chronic respiratory failure, unspecified whether with hypoxia or hypercapnia: Secondary | ICD-10-CM | POA: Diagnosis present

## 2019-07-10 DIAGNOSIS — R0602 Shortness of breath: Secondary | ICD-10-CM

## 2019-07-10 DIAGNOSIS — Z8379 Family history of other diseases of the digestive system: Secondary | ICD-10-CM

## 2019-07-10 DIAGNOSIS — Z515 Encounter for palliative care: Secondary | ICD-10-CM

## 2019-07-10 DIAGNOSIS — R2681 Unsteadiness on feet: Secondary | ICD-10-CM | POA: Diagnosis present

## 2019-07-10 DIAGNOSIS — J939 Pneumothorax, unspecified: Secondary | ICD-10-CM | POA: Diagnosis not present

## 2019-07-10 DIAGNOSIS — Z90710 Acquired absence of both cervix and uterus: Secondary | ICD-10-CM

## 2019-07-10 DIAGNOSIS — Z7982 Long term (current) use of aspirin: Secondary | ICD-10-CM

## 2019-07-10 DIAGNOSIS — J9601 Acute respiratory failure with hypoxia: Secondary | ICD-10-CM | POA: Diagnosis not present

## 2019-07-10 DIAGNOSIS — I10 Essential (primary) hypertension: Secondary | ICD-10-CM | POA: Diagnosis present

## 2019-07-10 DIAGNOSIS — R042 Hemoptysis: Secondary | ICD-10-CM | POA: Diagnosis present

## 2019-07-10 DIAGNOSIS — Z885 Allergy status to narcotic agent status: Secondary | ICD-10-CM

## 2019-07-10 DIAGNOSIS — J690 Pneumonitis due to inhalation of food and vomit: Secondary | ICD-10-CM | POA: Diagnosis not present

## 2019-07-10 DIAGNOSIS — E0500 Thyrotoxicosis with diffuse goiter without thyrotoxic crisis or storm: Secondary | ICD-10-CM | POA: Diagnosis present

## 2019-07-10 DIAGNOSIS — Z8249 Family history of ischemic heart disease and other diseases of the circulatory system: Secondary | ICD-10-CM

## 2019-07-10 DIAGNOSIS — Z87891 Personal history of nicotine dependence: Secondary | ICD-10-CM

## 2019-07-10 LAB — CBC WITH DIFFERENTIAL/PLATELET
Abs Immature Granulocytes: 0.03 10*3/uL (ref 0.00–0.07)
Basophils Absolute: 0.1 10*3/uL (ref 0.0–0.1)
Basophils Relative: 1 %
Eosinophils Absolute: 0.5 10*3/uL (ref 0.0–0.5)
Eosinophils Relative: 7 %
HCT: 34 % — ABNORMAL LOW (ref 36.0–46.0)
Hemoglobin: 10.4 g/dL — ABNORMAL LOW (ref 12.0–15.0)
Immature Granulocytes: 0 %
Lymphocytes Relative: 13 %
Lymphs Abs: 0.9 10*3/uL (ref 0.7–4.0)
MCH: 29 pg (ref 26.0–34.0)
MCHC: 30.6 g/dL (ref 30.0–36.0)
MCV: 94.7 fL (ref 80.0–100.0)
Monocytes Absolute: 0.5 10*3/uL (ref 0.1–1.0)
Monocytes Relative: 8 %
Neutro Abs: 5 10*3/uL (ref 1.7–7.7)
Neutrophils Relative %: 71 %
Platelets: 322 10*3/uL (ref 150–400)
RBC: 3.59 MIL/uL — ABNORMAL LOW (ref 3.87–5.11)
RDW: 12.9 % (ref 11.5–15.5)
WBC: 7.1 10*3/uL (ref 4.0–10.5)
nRBC: 0 % (ref 0.0–0.2)

## 2019-07-10 LAB — BRAIN NATRIURETIC PEPTIDE: B Natriuretic Peptide: 20.2 pg/mL (ref 0.0–100.0)

## 2019-07-10 LAB — COMPREHENSIVE METABOLIC PANEL
ALT: 21 U/L (ref 0–44)
AST: 22 U/L (ref 15–41)
Albumin: 3.6 g/dL (ref 3.5–5.0)
Alkaline Phosphatase: 93 U/L (ref 38–126)
Anion gap: 13 (ref 5–15)
BUN: 14 mg/dL (ref 6–20)
CO2: 38 mmol/L — ABNORMAL HIGH (ref 22–32)
Calcium: 9.2 mg/dL (ref 8.9–10.3)
Chloride: 88 mmol/L — ABNORMAL LOW (ref 98–111)
Creatinine, Ser: 0.59 mg/dL (ref 0.44–1.00)
GFR calc Af Amer: >60 mL/min (ref >60)
GFR calc non Af Amer: >60 mL/min (ref >60)
Glucose, Bld: 124 mg/dL — ABNORMAL HIGH (ref 70–99)
Potassium: 4.4 mmol/L (ref 3.5–5.1)
Sodium: 139 mmol/L (ref 135–145)
Total Bilirubin: 0.5 mg/dL (ref 0.3–1.2)
Total Protein: 7.3 g/dL (ref 6.5–8.1)

## 2019-07-10 LAB — TROPONIN I (HIGH SENSITIVITY)
Troponin I (High Sensitivity): 10 ng/L (ref <18)
Troponin I (High Sensitivity): 8 ng/L (ref <18)

## 2019-07-10 LAB — PROCALCITONIN: Procalcitonin: <0.1 ng/mL

## 2019-07-10 LAB — LACTIC ACID, PLASMA
Lactic Acid, Venous: 0.5 mmol/L (ref 0.5–1.9)
Lactic Acid, Venous: 1.2 mmol/L (ref 0.5–1.9)

## 2019-07-10 LAB — PROTIME-INR
INR: 0.9 (ref 0.8–1.2)
Prothrombin Time: 11.8 seconds (ref 11.4–15.2)

## 2019-07-10 LAB — SARS CORONAVIRUS 2 BY RT PCR (HOSPITAL ORDER, PERFORMED IN ~~LOC~~ HOSPITAL LAB): SARS Coronavirus 2: NEGATIVE

## 2019-07-10 MED ORDER — OXYCODONE HCL 5 MG PO TABS
5.0000 mg | ORAL_TABLET | ORAL | Status: DC | PRN
  Administered 2019-07-10: 5 mg via ORAL
  Administered 2019-07-11 – 2019-07-13 (×7): 10 mg via ORAL
  Administered 2019-07-13 (×2): 5 mg via ORAL
  Filled 2019-07-10: qty 1
  Filled 2019-07-10: qty 2
  Filled 2019-07-10 (×2): qty 1
  Filled 2019-07-10 (×4): qty 2
  Filled 2019-07-10 (×2): qty 1
  Filled 2019-07-10: qty 2

## 2019-07-10 MED ORDER — ADULT MULTIVITAMIN W/MINERALS CH
1.0000 | ORAL_TABLET | Freq: Every day | ORAL | Status: DC
  Administered 2019-07-11 – 2019-07-13 (×3): 1 via ORAL
  Filled 2019-07-10 (×3): qty 1

## 2019-07-10 MED ORDER — ACETAMINOPHEN 650 MG RE SUPP: 650.0000 mg | Freq: Four times a day (QID) | RECTAL | Status: DC | PRN

## 2019-07-10 MED ORDER — CYCLOBENZAPRINE HCL 10 MG PO TABS
10.0000 mg | ORAL_TABLET | Freq: Two times a day (BID) | ORAL | Status: DC | PRN
  Administered 2019-07-10 – 2019-07-12 (×4): 10 mg via ORAL
  Filled 2019-07-10 (×4): qty 1

## 2019-07-10 MED ORDER — NICOTINE 21 MG/24HR TD PT24
21.0000 mg | MEDICATED_PATCH | Freq: Once | TRANSDERMAL | Status: AC
  Administered 2019-07-10: 21 mg via TRANSDERMAL
  Filled 2019-07-10: qty 1

## 2019-07-10 MED ORDER — ARFORMOTEROL TARTRATE 15 MCG/2ML IN NEBU
15.0000 ug | INHALATION_SOLUTION | Freq: Two times a day (BID) | RESPIRATORY_TRACT | Status: DC
  Administered 2019-07-11 – 2019-07-13 (×4): 15 ug via RESPIRATORY_TRACT
  Filled 2019-07-10 (×8): qty 2

## 2019-07-10 MED ORDER — BUDESONIDE 0.25 MG/2ML IN SUSP
0.5000 mg | Freq: Two times a day (BID) | RESPIRATORY_TRACT | Status: DC
  Administered 2019-07-11 – 2019-07-12 (×4): 0.5 mg via RESPIRATORY_TRACT
  Filled 2019-07-10 (×4): qty 4

## 2019-07-10 MED ORDER — IPRATROPIUM-ALBUTEROL 0.5-2.5 (3) MG/3ML IN SOLN
3.0000 mL | Freq: Once | RESPIRATORY_TRACT | Status: AC
  Administered 2019-07-10: 3 mL via RESPIRATORY_TRACT
  Filled 2019-07-10: qty 3

## 2019-07-10 MED ORDER — METHYLPREDNISOLONE SODIUM SUCC 125 MG IJ SOLR
60.0000 mg | Freq: Four times a day (QID) | INTRAMUSCULAR | Status: DC
  Administered 2019-07-11: 60 mg via INTRAVENOUS
  Filled 2019-07-10 (×2): qty 2

## 2019-07-10 MED ORDER — GUAIFENESIN ER 600 MG PO TB12
600.0000 mg | ORAL_TABLET | Freq: Two times a day (BID) | ORAL | Status: DC
  Administered 2019-07-10 – 2019-07-13 (×6): 600 mg via ORAL
  Filled 2019-07-10 (×6): qty 1

## 2019-07-10 MED ORDER — ATORVASTATIN CALCIUM 20 MG PO TABS
40.0000 mg | ORAL_TABLET | Freq: Every day | ORAL | Status: DC
  Administered 2019-07-11 – 2019-07-12 (×2): 40 mg via ORAL
  Filled 2019-07-10 (×2): qty 2

## 2019-07-10 MED ORDER — PANTOPRAZOLE SODIUM 20 MG PO TBEC
20.0000 mg | DELAYED_RELEASE_TABLET | Freq: Every day | ORAL | Status: DC
  Administered 2019-07-12 – 2019-07-13 (×2): 20 mg via ORAL
  Filled 2019-07-10 (×5): qty 1

## 2019-07-10 MED ORDER — ALBUTEROL SULFATE (2.5 MG/3ML) 0.083% IN NEBU
2.5000 mg | INHALATION_SOLUTION | Freq: Four times a day (QID) | RESPIRATORY_TRACT | Status: DC | PRN
  Administered 2019-07-11: 2.5 mg via RESPIRATORY_TRACT
  Filled 2019-07-10: qty 3

## 2019-07-10 MED ORDER — SERTRALINE HCL 50 MG PO TABS
100.0000 mg | ORAL_TABLET | Freq: Every day | ORAL | Status: DC
  Administered 2019-07-11 – 2019-07-13 (×3): 100 mg via ORAL
  Filled 2019-07-10 (×3): qty 2

## 2019-07-10 MED ORDER — AMLODIPINE BESYLATE 10 MG PO TABS
10.0000 mg | ORAL_TABLET | Freq: Every day | ORAL | Status: DC
  Administered 2019-07-11 – 2019-07-13 (×3): 10 mg via ORAL
  Filled 2019-07-10 (×2): qty 1
  Filled 2019-07-10: qty 2

## 2019-07-10 MED ORDER — METHYLPREDNISOLONE SODIUM SUCC 125 MG IJ SOLR
125.0000 mg | Freq: Once | INTRAMUSCULAR | Status: AC
  Administered 2019-07-10: 125 mg via INTRAVENOUS
  Filled 2019-07-10: qty 2

## 2019-07-10 MED ORDER — HYDROXYZINE HCL 25 MG PO TABS
25.0000 mg | ORAL_TABLET | Freq: Three times a day (TID) | ORAL | Status: DC | PRN
  Administered 2019-07-11 – 2019-07-13 (×2): 25 mg via ORAL
  Filled 2019-07-10 (×2): qty 1

## 2019-07-10 MED ORDER — LORATADINE 10 MG PO TABS
10.0000 mg | ORAL_TABLET | Freq: Every day | ORAL | Status: DC
  Administered 2019-07-11 – 2019-07-13 (×3): 10 mg via ORAL
  Filled 2019-07-10 (×3): qty 1

## 2019-07-10 MED ORDER — ASPIRIN EC 81 MG PO TBEC
81.0000 mg | DELAYED_RELEASE_TABLET | Freq: Every day | ORAL | Status: DC
  Administered 2019-07-11 – 2019-07-13 (×3): 81 mg via ORAL
  Filled 2019-07-10 (×3): qty 1

## 2019-07-10 MED ORDER — HYDROCODONE-ACETAMINOPHEN 5-325 MG PO TABS
1.0000 | ORAL_TABLET | Freq: Once | ORAL | Status: AC
  Administered 2019-07-10: 1 via ORAL
  Filled 2019-07-10: qty 1

## 2019-07-10 MED ORDER — IOHEXOL 350 MG/ML SOLN: 75.0000 mL | Freq: Once | INTRAVENOUS | Status: DC | PRN

## 2019-07-10 MED ORDER — NICOTINE 21 MG/24HR TD PT24: 21.0000 mg | MEDICATED_PATCH | Freq: Every day | TRANSDERMAL | Status: DC

## 2019-07-10 MED ORDER — ACETAMINOPHEN 325 MG PO TABS: 650.0000 mg | ORAL_TABLET | Freq: Four times a day (QID) | ORAL | Status: DC | PRN

## 2019-07-10 MED ORDER — GUAIFENESIN ER 600 MG PO TB12: 1200.0000 mg | ORAL_TABLET | Freq: Two times a day (BID) | ORAL | Status: DC | PRN

## 2019-07-10 MED ORDER — ALBUTEROL SULFATE (2.5 MG/3ML) 0.083% IN NEBU: 2.5000 mg | INHALATION_SOLUTION | Freq: Four times a day (QID) | RESPIRATORY_TRACT | Status: DC

## 2019-07-10 NOTE — ED Provider Notes (Signed)
[facility] Emergency Department Provider Note ____________________________________________   First MD Initiated Contact with Patient 07/10/19 1541     (approximate)  I have reviewed the triage vital signs and the nursing notes.   HISTORY  Chief Complaint Shortness of Breath    HPI Megyn Leng is a 59 y.o. female with PMH as noted below including COPD on 3 L O2 at home who presents with worsening shortness of breath, hypoxia, cough, and weakness over the last few weeks, gradual onset, and persisting ever since she most recently left the hospital.  The patient was seen at pulmonology today and was referred to the ED for further evaluation and admission.  She denies any associated chest pain or fever.   Past Medical History:  Diagnosis Date  . Allergy   . Anxiety   . Colitis    recurrent  . COPD (chronic obstructive pulmonary disease) (HCC)   . Depression   . Graves disease    s/p iodine radiation treatment - euthyroid for many years per pt  . Hyperlipidemia   . Hypertension   . Loss of eye, LEFT 2007    Patient Active Problem List   Diagnosis Date Noted  . Acute respiratory failure with hypoxia (HCC) 07/10/2019  . Palliative care patient 07/09/2019  . Enrolled in chronic care management 07/09/2019  . Acute on chronic respiratory failure with hypoxia (HCC) 06/23/2019  . COPD exacerbation (HCC) 06/22/2019  . Chronic respiratory failure (HCC) 06/22/2019  . Elevated troponin 06/22/2019  . Pneumothorax on left 06/22/2019  . Pneumothorax 06/22/2019  . Chronic bronchitis with COPD (chronic obstructive pulmonary disease) (HCC) 05/14/2018  . Anxiety and depression 05/14/2018  . Elevated lipids 05/14/2018  . Seasonal allergic rhinitis due to pollen 05/14/2018  . Essential hypertension 05/14/2018  . Pulmonary emphysema (HCC) 06/23/2015    Past Surgical History:  Procedure Laterality Date  . ABDOMINAL HYSTERECTOMY    . FRACTURE SURGERY       Prior to Admission medications   Medication Sig Start Date End Date Taking? Authorizing Provider  albuterol (PROVENTIL) (2.5 MG/3ML) 0.083% nebulizer solution Take 3 mLs (2.5 mg total) by nebulization 4 (four) times daily. 11/13/18   Galen Manila, NP  albuterol (VENTOLIN HFA) 108 (90 Base) MCG/ACT inhaler INHALE 2 PUFFS INTO THE LUNGS EVERY 6 HOURS AS NEEDED FOR WHEEZING OR SHORTNESS OF BREATH Patient taking differently: Inhale 2 puffs into the lungs every 6 (six) hours as needed for wheezing or shortness of breath.  05/18/19   Malfi, Jodelle Gross, FNP  amLODipine (NORVASC) 10 MG tablet Take 1 tablet (10 mg total) by mouth daily. 06/26/19   Drema Dallas, MD  aspirin EC 81 MG tablet Take 81 mg by mouth daily.    [provider]  atorvastatin (LIPITOR) 40 MG tablet TAKE 1 TABLET(40 MG) BY MOUTH DAILY 06/25/19   Malfi, Jodelle Gross, FNP  budesonide (PULMICORT) 0.5 MG/2ML nebulizer solution Take 2 mLs (0.5 mg total) by nebulization 2 (two) times daily. 05/27/19 05/26/20  Malfi, Jodelle Gross, FNP  cetirizine (ZYRTEC) 10 MG tablet Take 1 tablet (10 mg total) by mouth daily. 05/27/19   Malfi, Jodelle Gross, FNP  cyclobenzaprine (FLEXERIL) 10 MG tablet TAKE 1 TABLET BY MOUTH 2 TIMES DAILY AS NEEDED FOR MUSCLE SPASMS Patient taking differently: Take 10 mg by mouth 2 (two) times daily as needed for muscle spasms.  05/27/19   Malfi, Jodelle Gross, FNP  formoterol (PERFOROMIST) 20 MCG/2ML nebulizer solution Take 2 mLs (20 mcg total)  by nebulization 2 (two) times daily. 08/01/18   Flora Lipps, MD  guaiFENesin (MUCINEX) 600 MG 12 hr tablet Take 2 tablets (1,200 mg total) by mouth 2 (two) times daily as needed for cough or to loosen phlegm. 05/27/19   Malfi, Lupita Raider, FNP  hydrOXYzine (ATARAX/VISTARIL) 25 MG tablet Take 1 tablet (25 mg total) by mouth 3 (three) times daily as needed for anxiety. 06/25/19   Allie Bossier, MD  Multiple Vitamin (MULTIVITAMIN WITH MINERALS) TABS tablet Take 1 tablet by mouth daily.    [provider]  nicotine (NICODERM CQ - DOSED IN MG/24 HOURS) 21 mg/24hr patch Place 1 patch (21 mg total) onto the skin daily. 06/26/19   Allie Bossier, MD  omeprazole (PRILOSEC OTC) 20 MG tablet Take 1 tablet (20 mg total) by mouth daily. 05/27/19   Malfi, Lupita Raider, FNP  oxyCODONE (OXY IR/ROXICODONE) 5 MG immediate release tablet Take 1-2 tablets (5-10 mg total) by mouth every 4 (four) hours as needed for moderate pain or severe pain. 06/25/19   Allie Bossier, MD  OXYGEN Inhale 3 L into the lungs continuous.     [provider]  predniSONE (DELTASONE) 10 MG tablet Take 3 tablets (30 mg total) by mouth daily with breakfast for 3 days, THEN 2 tablets (20 mg total) daily with breakfast for 3 days, THEN 1 tablet (10 mg total) daily with breakfast for 3 days. 07/09/19 07/18/19  Verl Bangs, FNP  sertraline (ZOLOFT) 100 MG tablet Take 1 tablet (100 mg total) by mouth daily. 05/27/19   Malfi, Lupita Raider, FNP    Allergies Codeine, Shellfish allergy, and Penicillins  Family History  Problem Relation Age of Onset  . Heart disease Father   . Alcohol abuse Father   . Alcohol abuse Brother   . Heart failure Sister   . Celiac disease Sister   . Healthy Sister     Social History Social History   Tobacco Use  . Smoking status: Former Smoker    Packs/day: 0.10    Types: Cigarettes  . Smokeless tobacco: Never Used  Vaping Use  . Vaping Use: Never used  Substance Use Topics  . Alcohol use: Never  . Drug use: Never    Review of Systems  Constitutional: No fever. Eyes: No redness. ENT: No sore throat. Cardiovascular: Denies chest pain. Respiratory: Positive for shortness of breath. Gastrointestinal: No vomiting or diarrhea.  Genitourinary: Negative for dysuria.  Musculoskeletal: Negative for back pain. Skin: Negative for rash. Neurological: Negative for headache.   ____________________________________________   PHYSICAL EXAM:  VITAL SIGNS: ED Triage Vitals  Enc Vitals  Group     BP 07/10/19 1121 124/63     Pulse Rate 07/10/19 1121 87     Resp 07/10/19 1121 (!) 22     Temp --      Temp src --      SpO2 07/10/19 1121 97 %     Weight 07/10/19 1122 141 lb 1.5 oz (64 kg)     Height 07/10/19 1122 5\' 6"  (1.676 m)     Head Circumference --      Peak Flow --      Pain Score 07/10/19 1122 9     Pain Loc --      Pain Edu? --      Excl. in [place]? --     Constitutional: Alert and oriented.  Chronically ill-appearing but in no acute distress. Eyes: Conjunctivae are normal.  Head: Atraumatic. Nose: No  congestion/rhinnorhea. Mouth/Throat: Mucous membranes are moist.   Neck: Normal range of motion.  Cardiovascular: Normal rate, regular rhythm. Grossly normal heart sounds.  Good peripheral circulation. Respiratory: Increased respiratory effort.  No retractions.  Diminished breath sounds bilaterally with scattered wheezes. Gastrointestinal: Soft and nontender. No distention.  Genitourinary: No flank tenderness. Musculoskeletal: No lower extremity edema.  Extremities warm and well perfused.  Neurologic:  Normal speech and language. No gross focal neurologic deficits are appreciated.  Skin:  Skin is warm and dry. No rash noted. Psychiatric: Mood and affect are normal. Speech and behavior are normal.  ____________________________________________   LABS (all labs ordered are listed, but only abnormal results are displayed)  Labs Reviewed  COMPREHENSIVE METABOLIC PANEL - Abnormal; Notable for the following components:      Result Value   Chloride 88 (*)    CO2 38 (*)    Glucose, Bld 124 (*)    All other components within normal limits  CBC WITH DIFFERENTIAL/PLATELET - Abnormal; Notable for the following components:   RBC 3.59 (*)    Hemoglobin 10.4 (*)    HCT 34.0 (*)    All other components within normal limits  CULTURE, BLOOD (ROUTINE X 2)  CULTURE, BLOOD (ROUTINE X 2) W REFLEX TO ID PANEL  SARS CORONAVIRUS 2 BY RT PCR (HOSPITAL ORDER, PERFORMED IN [facility])  EXPECTORATED SPUTUM ASSESSMENT W REFEX TO RESP CULTURE  LACTIC ACID, PLASMA  LACTIC ACID, PLASMA  PROTIME-INR  BRAIN NATRIURETIC PEPTIDE  PROCALCITONIN  BASIC METABOLIC PANEL  TROPONIN I (HIGH SENSITIVITY)  TROPONIN I (HIGH SENSITIVITY)   ____________________________________________  EKG  ED ECG REPORT I, [name], the attending physician, personally viewed and interpreted this ECG.  Date: 07/10/2019 EKG Time: 1123 Rate: 88 Rhythm: normal sinus rhythm QRS Axis: normal Intervals: normal ST/T Wave abnormalities: normal Narrative Interpretation: no evidence of acute ischemia  ____________________________________________  RADIOLOGY  CXR: Small pleural effusion.  Interstitial opacity left lung base.  ____________________________________________   PROCEDURES  Procedure(s) performed: No  Procedures  Critical Care performed: No ____________________________________________   INITIAL IMPRESSION / ASSESSMENT AND PLAN / ED COURSE  Pertinent labs & imaging results that were available during my care of the patient were reviewed by me and considered in my medical decision making (see chart for details).  59 year old female with PMH as noted above including COPD on home O2 presents with worsening shortness of breath and generalized weakness over the last few weeks since she was most recently in the hospital.  I reviewed the past medical records in epic.  The patient was admitted at the end of May with COPD exacerbation and a pneumothorax requiring a chest tube.  She was discharged on 6/1.  She was seen by Dr. Jayme Cloud from pulmonology today and found to have an O2 saturation in the 60s on room air (after she ran out of her own oxygen) and only up to the 80s on 8 L by nasal cannula.  She required O2 by nonrebreather and was referred to the emergency department for further evaluation and admission.  On exam, the patient is somewhat chronically ill  and weak appearing but in no acute distress.  She does have slightly increased work of breathing.  O2 saturation is now in the low to mid 90s on 4 L by nasal cannula.  Her other vital signs are normal.  She has somewhat diminished breath sounds bilaterally.  The remainder of the exam is as described above.  Although her oxygenation has  improved, overall presentation is consistent with continued COPD exacerbation.  I ordered Solu-Medrol and additional labs.  Chest x-ray shows no acute changes.  There is no evidence of pneumonia.  I have a low suspicion for acute cardiac etiology.  As per pulmonology recommendations we will plan for admission.  ----------------------------------------- 6:54 PM on 07/10/2019 -----------------------------------------  I discussed the case with Dr. Lajuana Ripple from the hospitalist service for admission.  Based on our discussion I have ordered a CT chest to rule out PE and to further evaluate the chest x-ray findings.  ____________________________________________   FINAL CLINICAL IMPRESSION(S) / ED DIAGNOSES  Final diagnoses:  COPD exacerbation (HCC)  Acute on chronic respiratory failure with hypoxia (HCC)      NEW MEDICATIONS STARTED DURING THIS VISIT:  New Prescriptions   No medications on file     Note:  This document was prepared using Dragon voice recognition software and may include unintentional dictation errors.   [name], MD 07/10/19 [phone]

## 2019-07-10 NOTE — Telephone Encounter (Signed)
L/m for pt to schedule OV

## 2019-07-10 NOTE — ED Triage Notes (Signed)
Patient brought over from [facility] pulmonology. Patient was hospitalized and discharged 2 weeks ago for pneumonia and pleural effusion. Patient reports she has not felt well since being discharge but the past two days have been worse. Patient is on 3L [place] chronically. She arrived to clinic 66% on room air due to O2 tank being empty. Patient placed on non-rebreather and O2 increased to 100%. Patient currently on 4L [place] with O2 sat 97%.

## 2019-07-10 NOTE — Telephone Encounter (Signed)
Will close encounter, as pt has been scheduled for OV today at 11:00a.

## 2019-07-10 NOTE — Progress Notes (Signed)
Patient presented today for work in visit after recent hospitalization for secondary LEFT spontaneous pneumothorax and COPD exacerbation.  Patient follows usually with Dr. Erin Fulling.  Last FEV1 of record was 0.46 L or 16% predicted noted on disability determination PFTs of December 2019.  Patient was admitted to [facility] from 29 May through 1 June.     Patient states that she has not been doing well since discharge.  Continues to have issues with severe dyspnea.  Presented to the clinic today ashen in color, with use of accessories of respiration and with saturations at 66%.  Her oxygen tank appears to have run out during transport from her home to the clinic.  She was placed on our oxygen tanks and even at 8 L/min of O2 was not recovering past 80% saturations.  She required 100% nonrebreather mask at 15 L O2 bled in.  Patient was transported to the emergency room for further evaluation.  Chest x-ray performed yesterday shows severe hyperinflation, and loculated fluid in the fissure on the left (pseudotumor) and small left pleural effusion.  Recommend evaluation of the emergency room and possible readmission for management of COPD exacerbation.     ICD-10-CM   1. Acute on chronic respiratory failure with hypoxia (HCC)  J96.21    To ED for evaluation and potential admission  2. COPD exacerbation (HCC)  J44.1      There will be no charge for this visit.    Gailen Shelter, MD [place] [facility]   *This note was dictated using voice recognition software/Dragon.  Despite best efforts to proofread, errors can occur which can change the meaning.  Any change was purely unintentional.

## 2019-07-10 NOTE — H&P (Signed)
History and Physical    DOA: 07/10/2019  PCP: [name], FNP  Patient coming from: home  Chief Complaint: Dyspnea and hypoxia  HPI: Alicia Gibson is a 59 y.o. female with history of hypertension, hyperlipidemia, depression/anxiety, COPD on nasal cannula O2 who follows Dr Belia Heman, [facility] - 06/25/19 for acute on chronic hypoxic respiratory failure due to COPD exacerbation as well as spontaneous left pneumothorax requiring chest tube placement and subsequently discharged home on 3 L nasal cannula, presented to pulmonary clinic today for post discharge follow-up.  She reported persistent dyspnea to pulmonologist Dr. Jayme Cloud and was noted to be significantly dyspneic as well as hypoxic with O2 sat 66% on room air (due to O2 tank being empty) and only improved to 80% on 8 L O2.  Patient states when she got out of the house she got along a portable tank that was empty and that she did not realize.  She had to be placed on nonrebreather mask at 15 L O2 in the pulmonary clinic and transported to the ED for further evaluation/admission for COPD exacerbation.  Chest x-ray on 6/15 had shown severe hyperinflation and loculated fluid in the left fissure as well as small left pleural effusion. Patient states she was advised 3.5 to 4 L continuous O2 upon last discharge.  She states she was using 2 L prior to that hospitalization.  At baseline she can only walk about 10 feet without getting dyspneic, although she reports she has been more uncomfortable and dyspneic on minimal exertion since last discharge.  She also reports intermittent hemoptysis-brown to reddish phlegm at times.  Denies any frank bleeding.  No fevers.  No known COVID-19 exposures. ED course: Afebrile, blood pressure 122/62, respiratory rate 20-22, pulse 89, O2 sat 91 to 94% on 4 L upon arrival here.  WBC 7.1, hemoglobin 10.4, platelet 322, sodium 139, potassium 4.4, BUN 14, creatinine 1.59, lactate  0.5->1.2.  Repeat chest x-ray revealed Small left pleural effusion with increased interstitial opacity within the left lung base, which may represent edema or infiltrate.Two large smoothly marginated masslike opacities within the left lung, favored to reflect loculated fluid within the major fissure, similar in appearance to previous study.  Patient requested to be admitted for further evaluation and management of COPD exacerbation.  CT chest/Covid 19 test have been ordered and pending.  Review of Systems: As per HPI otherwise 10 point review of systems negative.    Past Medical History:  Diagnosis Date  . Allergy   . Anxiety   . Colitis    recurrent  . COPD (chronic obstructive pulmonary disease) (HCC)   . Depression   . Graves disease    s/p iodine radiation treatment - euthyroid for many years per pt  . Hyperlipidemia   . Hypertension   . Loss of eye, LEFT 2007    Past Surgical History:  Procedure Laterality Date  . ABDOMINAL HYSTERECTOMY    . FRACTURE SURGERY      Social history:  reports that she has quit smoking. Her smoking use included cigarettes. She smoked 0.10 packs per day. She has never used smokeless tobacco. She reports that she does not drink alcohol and does not use drugs.   Allergies  Allergen Reactions  . Codeine Hives and Nausea And Vomiting  . Shellfish Allergy Hives  . Penicillins Hives and Rash    Family History  Problem Relation Age of Onset  . Heart disease Father   .  Alcohol abuse Father   . Alcohol abuse Brother   . Heart failure Sister   . Celiac disease Sister   . Healthy Sister       Prior to Admission medications   Medication Sig Start Date End Date Taking? Authorizing Provider  albuterol (PROVENTIL) (2.5 MG/3ML) 0.083% nebulizer solution Take 3 mLs (2.5 mg total) by nebulization 4 (four) times daily. 11/13/18   Mikey College, NP  albuterol (VENTOLIN HFA) 108 (90 Base) MCG/ACT inhaler INHALE 2 PUFFS INTO THE LUNGS EVERY 6  HOURS AS NEEDED FOR WHEEZING OR SHORTNESS OF BREATH Patient taking differently: Inhale 2 puffs into the lungs every 6 (six) hours as needed for wheezing or shortness of breath.  05/18/19   Malfi, Lupita Raider, FNP  amLODipine (NORVASC) 10 MG tablet Take 1 tablet (10 mg total) by mouth daily. 06/26/19   Allie Bossier, MD  aspirin EC 81 MG tablet Take 81 mg by mouth daily.    [provider]  atorvastatin (LIPITOR) 40 MG tablet TAKE 1 TABLET(40 MG) BY MOUTH DAILY 06/25/19   Malfi, Lupita Raider, FNP  budesonide (PULMICORT) 0.5 MG/2ML nebulizer solution Take 2 mLs (0.5 mg total) by nebulization 2 (two) times daily. 05/27/19 05/26/20  Malfi, Lupita Raider, FNP  cetirizine (ZYRTEC) 10 MG tablet Take 1 tablet (10 mg total) by mouth daily. 05/27/19   Malfi, Lupita Raider, FNP  cyclobenzaprine (FLEXERIL) 10 MG tablet TAKE 1 TABLET BY MOUTH 2 TIMES DAILY AS NEEDED FOR MUSCLE SPASMS Patient taking differently: Take 10 mg by mouth 2 (two) times daily as needed for muscle spasms.  05/27/19   Malfi, Lupita Raider, FNP  formoterol (PERFOROMIST) 20 MCG/2ML nebulizer solution Take 2 mLs (20 mcg total) by nebulization 2 (two) times daily. 08/01/18   Flora Lipps, MD  guaiFENesin (MUCINEX) 600 MG 12 hr tablet Take 2 tablets (1,200 mg total) by mouth 2 (two) times daily as needed for cough or to loosen phlegm. 05/27/19   Malfi, Lupita Raider, FNP  hydrOXYzine (ATARAX/VISTARIL) 25 MG tablet Take 1 tablet (25 mg total) by mouth 3 (three) times daily as needed for anxiety. 06/25/19   Allie Bossier, MD  Multiple Vitamin (MULTIVITAMIN WITH MINERALS) TABS tablet Take 1 tablet by mouth daily.    [provider]  nicotine (NICODERM CQ - DOSED IN MG/24 HOURS) 21 mg/24hr patch Place 1 patch (21 mg total) onto the skin daily. 06/26/19   Allie Bossier, MD  omeprazole (PRILOSEC OTC) 20 MG tablet Take 1 tablet (20 mg total) by mouth daily. 05/27/19   Malfi, Lupita Raider, FNP  oxyCODONE (OXY IR/ROXICODONE) 5 MG immediate release tablet Take 1-2 tablets (5-10 mg  total) by mouth every 4 (four) hours as needed for moderate pain or severe pain. 06/25/19   Allie Bossier, MD  OXYGEN Inhale 3 L into the lungs continuous.     [provider]  predniSONE (DELTASONE) 10 MG tablet Take 3 tablets (30 mg total) by mouth daily with breakfast for 3 days, THEN 2 tablets (20 mg total) daily with breakfast for 3 days, THEN 1 tablet (10 mg total) daily with breakfast for 3 days. 07/09/19 07/18/19  Verl Bangs, FNP  sertraline (ZOLOFT) 100 MG tablet Take 1 tablet (100 mg total) by mouth daily. 05/27/19   Verl Bangs, FNP    Physical Exam: Vitals:   07/10/19 1121 07/10/19 1122 07/10/19 1434 07/10/19 1748  BP: 124/63  130/65 120/70  Pulse: 87  90 88  Resp: Marland Kitchen)  22  20 20   Temp:    98.2 F (36.8 C)  SpO2: 97%  94% 94%  Weight:  64 kg    Height:  5\' 6"  (1.676 m)      Constitutional: Patient appears in mild respiratory distress while talking full sentences, on 4 L O2 and saturating well. Eyes: PERRL, lids and conjunctivae normal ENMT: Mucous membranes are dry. Posterior pharynx clear of any exudate or lesions.Normal dentition.  Neck: normal, supple, no masses, no thyromegaly Respiratory: + Accessory muscle use while talking full sentences, poor air entry bilaterally, no obvious wheezing or rhonchi or crepitus heard.  Cardiovascular: Regular rate and rhythm, no murmurs / rubs / gallops. No extremity edema. 2+ pedal pulses.  Reproducible pain along left lateral chest wall at the site of prior chest tube Abdomen: no tenderness, no masses palpated. No hepatosplenomegaly. Bowel sounds positive.  Musculoskeletal: no clubbing / cyanosis. No joint deformity upper and lower extremities. Good ROM, no contractures. Normal muscle tone.  Neurologic: CN 2-12 grossly intact. Sensation intact, DTR normal. Strength 5/5 in all 4.  Psychiatric: Normal judgment and insight. Alert and oriented x 3.  Mood anxious  SKIN/catheters: no rashes, lesions, ulcers. No induration  Labs  on Admission: I have personally reviewed following labs and imaging studies  CBC: Recent Labs  Lab 07/10/19 1135  WBC 7.1  NEUTROABS 5.0  HGB 10.4*  HCT 34.0*  MCV 94.7  PLT 322   Basic Metabolic Panel: Recent Labs  Lab 07/10/19 1135  NA 139  K 4.4  CL 88*  CO2 38*  GLUCOSE 124*  BUN 14  CREATININE 0.59  CALCIUM 9.2   GFR: Estimated Creatinine Clearance: 72.6 mL/min (by C-G formula based on SCr of 0.59 mg/dL). Recent Labs  Lab 07/10/19 1125 07/10/19 1135  WBC  --  7.1  LATICACIDVEN 0.5 1.2   Liver Function Tests: Recent Labs  Lab 07/10/19 1135  AST 22  ALT 21  ALKPHOS 93  BILITOT 0.5  PROT 7.3  ALBUMIN 3.6   No results for input(s): LIPASE, AMYLASE in the last 168 hours. No results for input(s): AMMONIA in the last 168 hours. Coagulation Profile: Recent Labs  Lab 07/10/19 1135  INR 0.9   Cardiac Enzymes: No results for input(s): CKTOTAL, CKMB, CKMBINDEX, TROPONINI in the last 168 hours. BNP (last 3 results) No results for input(s): PROBNP in the last 8760 hours. HbA1C: No results for input(s): HGBA1C in the last 72 hours. CBG: No results for input(s): GLUCAP in the last 168 hours. Lipid Profile: No results for input(s): CHOL, HDL, LDLCALC, TRIG, CHOLHDL, LDLDIRECT in the last 72 hours. Thyroid Function Tests: No results for input(s): TSH, T4TOTAL, FREET4, T3FREE, THYROIDAB in the last 72 hours. Anemia Panel: No results for input(s): VITAMINB12, FOLATE, FERRITIN, TIBC, IRON, RETICCTPCT in the last 72 hours. Urine analysis:    Component Value Date/Time   APPEARANCEUR Clear 02/01/2018 1306   GLUCOSEU Negative 02/01/2018 1306   BILIRUBINUR Negative 02/01/2018 1306   PROTEINUR Negative 02/01/2018 1306   NITRITE Negative 02/01/2018 1306   LEUKOCYTESUR Negative 02/01/2018 1306    Radiological Exams on Admission: Personally reviewed  DG Chest 2 View  Result Date: 07/10/2019 CLINICAL DATA:  Sepsis EXAM: CHEST - 2 VIEW COMPARISON:  07/09/2019  FINDINGS: Heart size is normal. Atherosclerotic calcification of the aortic knob. Again seen are 2 large smoothly marginated masslike opacities within the left lung favored to reflect loculated fluid within the major fissure. Small left pleural effusion. Increased interstitial opacity within the  left lung base. Hyperexpanded lungs. No pneumothorax. IMPRESSION: 1. Small left pleural effusion with increased interstitial opacity within the left lung base, which may represent edema or infiltrate. 2. Two large smoothly marginated masslike opacities within the left lung, favored to reflect loculated fluid within the major fissure. These are similar in appearance to previous study. Electronically Signed   By: Duanne Guess D.O.   On: 07/10/2019 12:27   DG Chest 2 View  Result Date: 07/09/2019 CLINICAL DATA:  59 year old female with history of left pneumothorax. EXAM: CHEST - 2 VIEW COMPARISON:  Chest x-ray 06/25/2019. FINDINGS: In the left hemithorax there are 2 mass-like opacities which are favored to reflect loculated fluid in association with the major fissure. Previously noted left pleural effusion has decreased in size, now likely partially loculated in the major fissure and layering in the lower left hemithorax. Extensive interstitial prominence in the left mid to lower lung. Linear scarring in the right middle lobe. No pneumothorax. No acute consolidative airspace disease. No evidence of pulmonary edema. Heart size is normal. Upper mediastinal contours are within normal limits. Aortic atherosclerosis. IMPRESSION: 1. No pneumothorax. 2. Interval development of mass-like opacities in the left lung which are strongly favored to represent areas of loculated pleural fluid associated with the left major fissure. There is also a small amount of layering left pleural effusion. These findings could be definitively evaluated with chest CT if clinically appropriate. 3. Interstitial prominence throughout the left mid to  lower lung concerning for fibrosis. This is asymmetric, and presumably post infectious or inflammatory in etiology. This could be further evaluated with high-resolution chest CT if clinically appropriate. 4. Aortic atherosclerosis. Electronically Signed   By: Trudie Reed M.D.   On: 07/09/2019 15:52    EKG: Independently reviewed.  Normal sinus rhythm, right atrial enlargement signs, QTC 462 MS     Assessment and Plan:   Principal Problem:   Acute on chronic respiratory failure with hypoxia (HCC) Active Problems:   Anxiety and depression   Essential hypertension   Pulmonary emphysema (HCC)   COPD exacerbation (HCC)   Chronic respiratory failure (HCC)   Pneumothorax on left    1.  Acute on chronic hypoxic respiratory failure: Appears to be multifactorial with running out of oxygen, suboptimal recovery from recent hospitalization and pleural effusions noted on chest x-ray. Patient currently back to baseline 4 L nasal cannula.  She states she was issued a prescription for Z-Pak by her PCP yesterday but did not fill it yet.  She denies any fevers.Patient does give history of hemoptysis but could be related to cough bouts.  Resume Mucinex, will also order cough suppressants.  Will admit with MDI treatments (until Covid ruled out), IV steroids.  No evidence of pneumothorax on chest x-ray.  CT chest to rule out PE/better evaluation of pleural effusions/hemoptysis source- pending.  Follow-up COVID-19 screen (was negative in May 2021).  Can resume home nebulizer treatments once Covid ruled out as discussed with bedside nurse.  Will send sputum cultures/procalcitonin level-will defer antibiotics for now given recent hospitalization and antibiotic course completion, no obvious infiltrates or signs of infection.    2.  Hypertension: Resume home medications-amlodipine  3.  Hyperlipidemia: Resume Lipitor  4.  Anxiety/depression: Resume sertraline  5.  Tobacco use: Resume nicotine patch   DVT  prophylaxis: SCDs  COVID screen: Pending  Code Status: Full code     Patient/Family Communication: Discussed with patient and all questions answered to satisfaction.  Consults called: Will request pulmonary to  follow-up Admission status :Patient will be admitted under OBSERVATION status.The patient's presenting symptoms, physical exam findings, and initial radiographic and laboratory data in the context of their medical condition is felt to place them at low risk for further clinical deterioration. Furthermore, it is anticipated that the patient will be medically stable for discharge from the hospital within 2 midnights of hospital stay.    Alessandra Bevels MD [facility] Pager in [place]  If 7PM-7AM, please contact night-coverage [email or web address]   07/10/2019, 6:21 PM

## 2019-07-10 NOTE — Patient Instructions (Signed)
Patient transported to ED due to respiratory distress and acute on chronic respiratory failure with hypoxia.

## 2019-07-10 NOTE — ED Notes (Signed)
Blood cultures reobtained and sent to lab

## 2019-07-10 NOTE — ED Notes (Signed)
Pt updated in the [facility], VS reassessed. 

## 2019-07-11 ENCOUNTER — Observation Stay: Payer: Medicaid Other

## 2019-07-11 DIAGNOSIS — F419 Anxiety disorder, unspecified: Secondary | ICD-10-CM | POA: Diagnosis not present

## 2019-07-11 DIAGNOSIS — Z8379 Family history of other diseases of the digestive system: Secondary | ICD-10-CM | POA: Diagnosis not present

## 2019-07-11 DIAGNOSIS — Z880 Allergy status to penicillin: Secondary | ICD-10-CM | POA: Diagnosis not present

## 2019-07-11 DIAGNOSIS — Z9981 Dependence on supplemental oxygen: Secondary | ICD-10-CM | POA: Diagnosis not present

## 2019-07-11 DIAGNOSIS — J9611 Chronic respiratory failure with hypoxia: Secondary | ICD-10-CM | POA: Diagnosis not present

## 2019-07-11 DIAGNOSIS — J441 Chronic obstructive pulmonary disease with (acute) exacerbation: Secondary | ICD-10-CM | POA: Diagnosis not present

## 2019-07-11 DIAGNOSIS — Z20822 Contact with and (suspected) exposure to covid-19: Secondary | ICD-10-CM | POA: Diagnosis not present

## 2019-07-11 DIAGNOSIS — R0602 Shortness of breath: Secondary | ICD-10-CM | POA: Diagnosis not present

## 2019-07-11 DIAGNOSIS — Z8249 Family history of ischemic heart disease and other diseases of the circulatory system: Secondary | ICD-10-CM | POA: Diagnosis not present

## 2019-07-11 DIAGNOSIS — E785 Hyperlipidemia, unspecified: Secondary | ICD-10-CM | POA: Diagnosis present

## 2019-07-11 DIAGNOSIS — J9601 Acute respiratory failure with hypoxia: Secondary | ICD-10-CM

## 2019-07-11 DIAGNOSIS — I10 Essential (primary) hypertension: Secondary | ICD-10-CM | POA: Diagnosis not present

## 2019-07-11 DIAGNOSIS — Z91041 Radiographic dye allergy status: Secondary | ICD-10-CM | POA: Diagnosis not present

## 2019-07-11 DIAGNOSIS — J40 Bronchitis, not specified as acute or chronic: Secondary | ICD-10-CM | POA: Diagnosis not present

## 2019-07-11 DIAGNOSIS — E0500 Thyrotoxicosis with diffuse goiter without thyrotoxic crisis or storm: Secondary | ICD-10-CM | POA: Diagnosis present

## 2019-07-11 DIAGNOSIS — J90 Pleural effusion, not elsewhere classified: Secondary | ICD-10-CM | POA: Diagnosis not present

## 2019-07-11 DIAGNOSIS — Z7952 Long term (current) use of systemic steroids: Secondary | ICD-10-CM | POA: Diagnosis not present

## 2019-07-11 DIAGNOSIS — A419 Sepsis, unspecified organism: Secondary | ICD-10-CM | POA: Diagnosis not present

## 2019-07-11 DIAGNOSIS — Z885 Allergy status to narcotic agent status: Secondary | ICD-10-CM | POA: Diagnosis not present

## 2019-07-11 DIAGNOSIS — F329 Major depressive disorder, single episode, unspecified: Secondary | ICD-10-CM | POA: Diagnosis not present

## 2019-07-11 DIAGNOSIS — Z7982 Long term (current) use of aspirin: Secondary | ICD-10-CM | POA: Diagnosis not present

## 2019-07-11 DIAGNOSIS — J9621 Acute and chronic respiratory failure with hypoxia: Secondary | ICD-10-CM | POA: Diagnosis not present

## 2019-07-11 DIAGNOSIS — J939 Pneumothorax, unspecified: Secondary | ICD-10-CM | POA: Diagnosis not present

## 2019-07-11 DIAGNOSIS — Z7951 Long term (current) use of inhaled steroids: Secondary | ICD-10-CM | POA: Diagnosis not present

## 2019-07-11 DIAGNOSIS — R042 Hemoptysis: Secondary | ICD-10-CM | POA: Diagnosis present

## 2019-07-11 DIAGNOSIS — Z7189 Other specified counseling: Secondary | ICD-10-CM | POA: Diagnosis not present

## 2019-07-11 DIAGNOSIS — Z720 Tobacco use: Secondary | ICD-10-CM | POA: Diagnosis not present

## 2019-07-11 DIAGNOSIS — J690 Pneumonitis due to inhalation of food and vomit: Secondary | ICD-10-CM | POA: Diagnosis not present

## 2019-07-11 DIAGNOSIS — J439 Emphysema, unspecified: Secondary | ICD-10-CM | POA: Diagnosis not present

## 2019-07-11 DIAGNOSIS — Z91013 Allergy to seafood: Secondary | ICD-10-CM | POA: Diagnosis not present

## 2019-07-11 DIAGNOSIS — Z811 Family history of alcohol abuse and dependence: Secondary | ICD-10-CM | POA: Diagnosis not present

## 2019-07-11 DIAGNOSIS — Z515 Encounter for palliative care: Secondary | ICD-10-CM | POA: Diagnosis not present

## 2019-07-11 DIAGNOSIS — Z79899 Other long term (current) drug therapy: Secondary | ICD-10-CM | POA: Diagnosis not present

## 2019-07-11 DIAGNOSIS — Z90710 Acquired absence of both cervix and uterus: Secondary | ICD-10-CM | POA: Diagnosis not present

## 2019-07-11 LAB — BASIC METABOLIC PANEL
Anion gap: 12 (ref 5–15)
BUN: 15 mg/dL (ref 6–20)
CO2: 36 mmol/L — ABNORMAL HIGH (ref 22–32)
Calcium: 9.1 mg/dL (ref 8.9–10.3)
Chloride: 91 mmol/L — ABNORMAL LOW (ref 98–111)
Creatinine, Ser: 0.55 mg/dL (ref 0.44–1.00)
GFR calc Af Amer: >60 mL/min (ref >60)
GFR calc non Af Amer: >60 mL/min (ref >60)
Glucose, Bld: 158 mg/dL — ABNORMAL HIGH (ref 70–99)
Potassium: 3.9 mmol/L (ref 3.5–5.1)
Sodium: 139 mmol/L (ref 135–145)

## 2019-07-11 LAB — FIBRIN DERIVATIVES D-DIMER (ARMC ONLY): Fibrin derivatives D-dimer (ARMC): 1932.04 ng/mL (FEU) — ABNORMAL HIGH (ref 0.00–499.00)

## 2019-07-11 MED ORDER — SODIUM CHLORIDE 0.9 % IV SOLN
3.0000 g | Freq: Four times a day (QID) | INTRAVENOUS | Status: DC
  Administered 2019-07-11 – 2019-07-13 (×8): 3 g via INTRAVENOUS
  Filled 2019-07-11: qty 8
  Filled 2019-07-11: qty 3
  Filled 2019-07-11: qty 8
  Filled 2019-07-11: qty 3
  Filled 2019-07-11: qty 8
  Filled 2019-07-11 (×2): qty 3
  Filled 2019-07-11: qty 8
  Filled 2019-07-11 (×2): qty 3
  Filled 2019-07-11: qty 8
  Filled 2019-07-11: qty 3

## 2019-07-11 MED ORDER — METHYLPREDNISOLONE SODIUM SUCC 40 MG IJ SOLR
40.0000 mg | Freq: Two times a day (BID) | INTRAMUSCULAR | Status: DC
  Administered 2019-07-12 – 2019-07-13 (×4): 40 mg via INTRAVENOUS
  Filled 2019-07-11 (×4): qty 1

## 2019-07-11 MED ORDER — ENOXAPARIN SODIUM 40 MG/0.4ML ~~LOC~~ SOLN
40.0000 mg | SUBCUTANEOUS | Status: DC
  Administered 2019-07-11 – 2019-07-12 (×2): 40 mg via SUBCUTANEOUS
  Filled 2019-07-11 (×2): qty 0.4

## 2019-07-11 NOTE — Consult Note (Signed)
Name: Alicia Gibson MRN: [id number] DOB: 12-Jun-1960     CONSULTATION DATE: 07/11/2019 REFERRING MD : Clent Demark   CHIEF COMPLAINT: SOB  STUDIES:      CT chest Independently reviewed by Me today extenssive emphysema, loculated effusions and pneumonia  HISTORY OF PRESENT ILLNESS: 59 y.o. female with history of hypertension, hyperlipidemia, depression/anxiety, COPD on nasal cannula O2   -recently admitted to this [facility] from 5/29 - 06/25/19 for acute on chronic hypoxic respiratory failure due to COPD exacerbation as well as spontaneous left pneumothorax requiring chest tube placement and subsequently discharged home on 3 L nasal cannula, Seen at [facility] office significantly dyspneic as well as hypoxic with O2 sat 66% on room air (due to O2 tank being empty) and only improved to 80% on 8 L O2.    -Patient states when she got out of the house she got along a portable tank that was empty and that she did not realize.   -She had to be placed on nonrebreather mask at 15 L O2 in the pulmonary clinic and transported to the ED for further evaluation/admission for COPD exacerbation.    Chest x-ray on 6/15 had shown severe hyperinflation and loculated fluid in the left fissure as well as small left pleural effusion.  CT chest 6/17 confirms loculated effusions she reports she has been more uncomfortable and dyspneic on minimal exertion since last discharge.  She also reports intermittent hemoptysis-brown to reddish phlegm at times.     No fevers.  No known COVID-19 exposures.  ED course: Afebrile, blood pressure 122/62, respiratory rate 20-22, pulse 89, O2 sat 91 to 94% on 4 L upon arrival here.  WBC 7.1, hemoglobin 10.4, platelet 322, sodium 139, potassium 4.4, BUN 14, creatinine 1.59, lactate 0.5->1.2.  -Two large smoothly marginated masslike opacities within the left lung, favored to reflect loculated fluid within the major fissure, similar in appearance to previous study.  Patient requested  to be admitted for further evaluation and management of COPD exacerbation.    Patient feels better since arrival Still has some wheezing, NAD   PAST MEDICAL HISTORY :   has a past medical history of Allergy, Anxiety, Colitis, COPD (chronic obstructive pulmonary disease) ([place]), Depression, Graves disease, Hyperlipidemia, Hypertension, and Loss of eye, LEFT (2007).  has a past surgical history that includes Abdominal hysterectomy and Fracture surgery. Prior to Admission medications   Medication Sig Start Date End Date Taking? Authorizing Provider  albuterol (PROVENTIL) (2.5 MG/3ML) 0.083% nebulizer solution Take 3 mLs (2.5 mg total) by nebulization 4 (four) times daily. 11/13/18  Yes Mikey College, NP  albuterol (VENTOLIN HFA) 108 (90 Base) MCG/ACT inhaler INHALE 2 PUFFS INTO THE LUNGS EVERY 6 HOURS AS NEEDED FOR WHEEZING OR SHORTNESS OF BREATH Patient taking differently: Inhale 2 puffs into the lungs every 6 (six) hours as needed for wheezing or shortness of breath.  05/18/19  Yes Malfi, Lupita Raider, FNP  amLODipine (NORVASC) 10 MG tablet Take 1 tablet (10 mg total) by mouth daily. 06/26/19  Yes Allie Bossier, MD  aspirin EC 81 MG tablet Take 81 mg by mouth daily.   Yes [provider]  atorvastatin (LIPITOR) 40 MG tablet TAKE 1 TABLET(40 MG) BY MOUTH DAILY 06/25/19  Yes Malfi, Lupita Raider, FNP  budesonide (PULMICORT) 0.5 MG/2ML nebulizer solution Take 2 mLs (0.5 mg total) by nebulization 2 (two) times daily. 05/27/19 05/26/20 Yes Malfi, Lupita Raider, FNP  cetirizine (ZYRTEC) 10 MG tablet Take 1 tablet (10 mg total) by mouth  daily. 05/27/19  Yes Malfi, Jodelle Gross, FNP  cyclobenzaprine (FLEXERIL) 10 MG tablet TAKE 1 TABLET BY MOUTH 2 TIMES DAILY AS NEEDED FOR MUSCLE SPASMS Patient taking differently: Take 10 mg by mouth 2 (two) times daily as needed for muscle spasms.  05/27/19  Yes Malfi, Jodelle Gross, FNP  formoterol (PERFOROMIST) 20 MCG/2ML nebulizer solution Take 2 mLs (20 mcg total) by nebulization 2  (two) times daily. 08/01/18  Yes Erin Fulling, MD  guaiFENesin (MUCINEX) 600 MG 12 hr tablet Take 2 tablets (1,200 mg total) by mouth 2 (two) times daily as needed for cough or to loosen phlegm. 05/27/19  Yes Malfi, Jodelle Gross, FNP  hydrOXYzine (ATARAX/VISTARIL) 25 MG tablet Take 1 tablet (25 mg total) by mouth 3 (three) times daily as needed for anxiety. 06/25/19  Yes Drema Dallas, MD  Multiple Vitamin (MULTIVITAMIN WITH MINERALS) TABS tablet Take 1 tablet by mouth daily.   Yes [provider]  nicotine (NICODERM CQ - DOSED IN MG/24 HOURS) 21 mg/24hr patch Place 1 patch (21 mg total) onto the skin daily. 06/26/19  Yes Drema Dallas, MD  omeprazole (PRILOSEC OTC) 20 MG tablet Take 1 tablet (20 mg total) by mouth daily. 05/27/19  Yes Malfi, Jodelle Gross, FNP  oxyCODONE (OXY IR/ROXICODONE) 5 MG immediate release tablet Take 1-2 tablets (5-10 mg total) by mouth every 4 (four) hours as needed for moderate pain or severe pain. 06/25/19  Yes Drema Dallas, MD  sertraline (ZOLOFT) 100 MG tablet Take 1 tablet (100 mg total) by mouth daily. 05/27/19  Yes Malfi, Jodelle Gross, FNP  predniSONE (DELTASONE) 10 MG tablet Take 3 tablets (30 mg total) by mouth daily with breakfast for 3 days, THEN 2 tablets (20 mg total) daily with breakfast for 3 days, THEN 1 tablet (10 mg total) daily with breakfast for 3 days. 07/09/19 07/18/19  Malfi, Jodelle Gross, FNP   Allergies  Allergen Reactions  . Contrast Allergy Premed Pack [Prednisone & Diphenhydramine] Anaphylaxis    Patient states she had iv contrast in 2008 which caused her throat to close and difficulty breathing.   . Iodine Anaphylaxis  . Codeine Hives and Nausea And Vomiting  . Shellfish Allergy Hives  . Penicillins Hives and Rash    FAMILY HISTORY:  family history includes Alcohol abuse in her brother and father; Celiac disease in her sister; Healthy in her sister; Heart disease in her father; Heart failure in her sister. SOCIAL HISTORY:  reports that she has quit  smoking. Her smoking use included cigarettes. She smoked 0.10 packs per day. She has never used smokeless tobacco. She reports that she does not drink alcohol and does not use drugs.    Review of Systems:  Gen:  Denies  fever, sweats, chills weigh loss  HEENT: enucleated LEFT eye Cardiac:  No dizziness, chest pain or heaviness, chest tightness,edema, No JVD Resp: + cough, +sputum production, +shortness of breath,+wheezing, -hemoptysis,  Gi: Denies swallowing difficulty, stomach pain, nausea or vomiting, diarrhea, constipation, bowel incontinence Gu:  Denies bladder incontinence, burning urine Ext:   Denies Joint pain, stiffness or swelling Skin: Denies  skin rash, easy bruising or bleeding or hives Endoc:  Denies polyuria, polydipsia , polyphagia or weight change Psych:   Denies depression, insomnia or hallucinations  Other:  All other systems negative     VITAL SIGNS: Temp:  [98.2 F (36.8 C)-98.3 F (36.8 C)] 98.3 F (36.8 C) (06/17 0835) Pulse Rate:  [85-108] 95 (06/17 0623) Resp:  [16-27] 16 (06/17 0835)  BP: (103-147)/(59-90) 143/88 (06/17 0835) SpO2:  [89 %-97 %] 95 % (06/17 0835) FiO2 (%):  [95 %] 95 % (06/17 0854)     SpO2: 95 % O2 Flow Rate (L/min): 3.5 L/min FiO2 (%): 95 %   Physical Examination:   General Appearance: No distress  Neuro:without focal findings,  speech normal,  HEENT: PERRLA, EOM intact.   Pulmonary: + wheezing.  CardiovascularNormal S1,S2.  No m/r/g.   Abdomen: Benign, Soft, non-tender. Renal:  No costovertebral tenderness  GU:  Not performed at this time. Endoc: No evident thyromegaly Skin:   warm, no rashes, no ecchymosis  Extremities: normal, no cyanosis, clubbing. PSYCHIATRIC: Mood, affect within normal limits.   ALL OTHER ROS ARE NEGATIVE   MEDICATIONS: I have reviewed all medications and confirmed regimen as documented    CULTURE RESULTS   Recent Results (from the past 240 hour(s))  Culture, blood (Routine x 2)      Status: None (Preliminary result)   Collection Time: 07/10/19 11:35 AM   Specimen: BLOOD  Result Value Ref Range Status   Specimen Description BLOOD BLOOD LEFT FOREARM  Final   Special Requests   Final    BOTTLES DRAWN AEROBIC AND ANAEROBIC Blood Culture results may not be optimal due to an excessive volume of blood received in culture bottles   Culture   Final    NO GROWTH < 24 HOURS Performed at [facility], [address] [place], [place] [address]    Report Status PENDING  Incomplete  Culture, blood (Routine X 2) w Reflex to ID Panel     Status: None (Preliminary result)   Collection Time: 07/10/19  5:43 PM   Specimen: BLOOD  Result Value Ref Range Status   Specimen Description BLOOD BLOOD LEFT WRIST  Final   Special Requests   Final    BOTTLES DRAWN AEROBIC AND ANAEROBIC Blood Culture adequate volume   Culture   Final    NO GROWTH < 12 HOURS Performed at [facility], [address], [place], [place] [address]    Report Status PENDING  Incomplete  SARS Coronavirus 2 by RT PCR (hospital order, performed in [facility] hospital lab) Nasopharyngeal Nasopharyngeal Swab     Status: None   Collection Time: 07/10/19  5:45 PM   Specimen: Nasopharyngeal Swab  Result Value Ref Range Status   SARS Coronavirus 2 NEGATIVE NEGATIVE Final    Comment: (NOTE) SARS-CoV-2 target nucleic acids are NOT DETECTED.  The SARS-CoV-2 RNA is generally detectable in upper and lower respiratory specimens during the acute phase of infection. The lowest concentration of SARS-CoV-2 viral copies this assay can detect is 250 copies / mL. A negative result does not preclude SARS-CoV-2 infection and should not be used as the sole basis for treatment or other patient management decisions.  A negative result may occur with improper specimen collection / handling, submission of specimen other than nasopharyngeal swab, presence of viral mutation(s) within the areas targeted by this assay,  and inadequate number of viral copies (<250 copies / mL). A negative result must be combined with clinical observations, patient history, and epidemiological information.  Fact Sheet for Patients:   BoilerBrush.com.cy  Fact Sheet for Healthcare Providers: https://pope.com/  This test is not yet approved or  cleared by the [place] FDA and has been authorized for detection and/or diagnosis of SARS-CoV-2 by FDA under an Emergency Use Authorization (EUA).  This EUA will remain in effect (meaning this test can be used) for the duration  of the COVID-19 declaration under Section 564(b)(1) of the Act, 21 U.S.C. section 360bbb-3(b)(1), unless the authorization is terminated or revoked sooner.  Performed at [facility], [address] [place], [place] [address]           IMAGING    CT CHEST WO CONTRAST  Result Date: 07/11/2019 CLINICAL DATA:  COPD.  Shortness of breath EXAM: CT CHEST WITHOUT CONTRAST TECHNIQUE: Multidetector CT imaging of the chest was performed following the standard protocol without IV contrast. COMPARISON:  Multiple recent chest radiographs FINDINGS: Cardiovascular: No cardiomegaly or pericardial effusion. Atherosclerotic calcification Mediastinum/Nodes: Negative for adenopathy or mass. Lungs/Pleura: Patchy airspace disease in the dependent left lung. There are 2 ovoid opacities along the left major fissure which are new from May 2021 chest radiographs. The largest is more cranial and measures 6 x 3 cm and the more inferior is 5 x 3 cm. These have a somewhat lucent center and the uppermost has internal gas. There is a small dependent left pleural effusion which may have internal gas based on reformats. The right lung is clear. Hyperinflation and emphysema. Spiculated nodule in the left upper lobe measuring 12 x 8 mm on coronal reformats. Upper Abdomen: Negative for acute finding Musculoskeletal: No acute or  aggressive finding IMPRESSION: 1. Two ovoid masses along the upper and lower left major fissure which have developed since chest radiographs in may 2021, most consistent with loculated pleural effusions. There is also dependent pleural fluid with possible superimposed gas. 2. Asymmetric reticulation in the left lower lobe, presumably pneumonia. 3. 12 x 8 mm left upper lobe nodule. Consider one of the following in 3 months for both low-risk and high-risk individuals: (a) repeat chest CT, (b) follow-up PET-CT, or (c) tissue sampling. This recommendation follows the consensus statement: Guidelines for Management of Incidental Pulmonary Nodules Detected on CT Images: From the Fleischner Society 2017; Radiology 2017; 284:228-243. 4. Advanced emphysema. Electronically Signed   By: Marnee Spring M.D.   On: 07/11/2019 11:21       ASSESSMENT AND PLAN SYNOPSIS  59 year old pleasant white female admitted for underlying COPD and emphysema in the setting of chronic hypoxic respiratory failure with ongoing tobacco abuse with gold stage D symptoms of COPD in the setting of deconditioned state with acute COPD exacerbation with pneumonia and loculated effusions.  SEVERE COPD EXACERBATION -continue IV steroids as prescribed -continue NEB THERAPY as prescribed -morphine as needed -wean fio2 as needed and tolerated  CHRONIC HYPOXIC RESP FAILURE END STAGE LUNG DISEASE DIFFUSE EMPHYSEMA OXYGEN AS NEEDED  ASPIRATION PNEUMONIA START UNASYN   ELECTROLYTES -follow labs as needed -replace as needed -pharmacy consultation and following   No indication for Bronch at this time   Lucie Leather, M.D.  [facility]  Medical Director [facility] Medical Director [facility] Cardio-Pulmonary Department

## 2019-07-11 NOTE — ED Notes (Signed)
Pt resting in bed, breathing controlled, sats 94% on 4.5L, oxygen decreased to 3.5L pt tolerating well. Call light in reach, bed locked and low.

## 2019-07-11 NOTE — Progress Notes (Signed)
PROGRESS NOTE    Alicia Gibson  EVO:[id number]  DOB: [birth year]  PCP: [name], FNP Admit date:07/10/2019 59 y.o. female with history of hypertension, hyperlipidemia, depression/anxiety, COPD on nasal cannula O2 who follows Dr Belia Heman, was [facility] - 06/25/19 for acute on chronic hypoxic respiratory failure due to COPD exacerbation as well as spontaneous left pneumothorax requiring chest tube placement and subsequently discharged home on 3 L nasal cannula, presented to pulmonary clinic today for post discharge follow-up.  She reported persistent dyspnea to pulmonologist Dr. Jayme Cloud and was noted to be significantly dyspneic as well as hypoxic with O2 sat 66% on room air (due to O2 tank being empty) and only improved to 80% on 8 L O2. She had to be placed on nonrebreather mask at 15 L O2 in the pulmonary clinic and transported to the ED for further evaluation/admission for COPD exacerbation.  Chest x-ray on 6/15 had shown severe hyperinflation and loculated fluid in the left fissure as well as small left pleural effusion.Patient states she was advised 3.5 to 4 L continuous O2 upon last discharge, however has felt progressive dyspnea with reduced exercise tolerance since then. ED Course: Afebrile, labs okay.Repeat chest x-ray revealed Small left pleural effusion with increased interstitial opacity within the left lung base, which may represent edema or infiltrate.Two large smoothly marginated masslike opacities within the left lung, favored to reflect loculated fluid within the major fissure, similar in appearance to previous study. Hospital course: Patient admitted to [facility] for further evaluation and management.  Subjective: Patient evaluated by pulmonary earlier today.  She appears tearful and frustrated with her overall health and recurrent admissions.  Objective: Vitals:   07/11/19 0835 07/11/19 1517 07/11/19 1518 07/11/19 1603  BP: (!) 143/88 127/74   128/82  Pulse:    (!) 103  Resp: 16 16 18    Temp: 98.3 F (36.8 C)  98.5 F (36.9 C) 98.2 F (36.8 C)  TempSrc: Oral  Oral Oral  SpO2: 95% 94%  91%  Weight:      Height:        Intake/Output Summary (Last 24 hours) at 07/11/2019 1819 Last data filed at 07/11/2019 1554 Gross per 24 hour  Intake 100 ml  Output --  Net 100 ml   Filed Weights   07/10/19 1122  Weight: 64 kg    Physical Examination:  General exam: Appears upset and tearful Respiratory system: Respiratory effort somewhat increased while talking full sentences, some pursed lip breathing, poor air entry on auscultation bilaterally Cardiovascular system: S1 & S2 heard, RRR. No JVD, murmurs. No pedal edema. Gastrointestinal system: Abdomen is nondistended, soft and nontender. Normal bowel sounds heard. Central nervous system: Alert and oriented. No new focal neurological deficits. Extremities: No contractures, edema or joint deformities.  Skin: No rashes, lesions or ulcers Psychiatry: Judgement and insight appear normal. Mood & affect anxious  Data Reviewed: I have personally reviewed following labs and imaging studies  CBC: Recent Labs  Lab 07/10/19 1135  WBC 7.1  NEUTROABS 5.0  HGB 10.4*  HCT 34.0*  MCV 94.7  PLT 322   Basic Metabolic Panel: Recent Labs  Lab 07/10/19 1135 07/11/19 0611  NA 139 139  K 4.4 3.9  CL 88* 91*  CO2 38* 36*  GLUCOSE 124* 158*  BUN 14 15  CREATININE 0.59 0.55  CALCIUM 9.2 9.1   GFR: Estimated Creatinine Clearance: 72.6 mL/min (by C-G formula based on SCr of 0.55 mg/dL). Liver Function Tests: Recent  Labs  Lab 07/10/19 1135  AST 22  ALT 21  ALKPHOS 93  BILITOT 0.5  PROT 7.3  ALBUMIN 3.6   No results for input(s): LIPASE, AMYLASE in the last 168 hours. No results for input(s): AMMONIA in the last 168 hours. Coagulation Profile: Recent Labs  Lab 07/10/19 1135  INR 0.9   Cardiac Enzymes: No results for input(s): CKTOTAL, CKMB, CKMBINDEX, TROPONINI in the  last 168 hours. BNP (last 3 results) No results for input(s): PROBNP in the last 8760 hours. HbA1C: No results for input(s): HGBA1C in the last 72 hours. CBG: No results for input(s): GLUCAP in the last 168 hours. Lipid Profile: No results for input(s): CHOL, HDL, LDLCALC, TRIG, CHOLHDL, LDLDIRECT in the last 72 hours. Thyroid Function Tests: No results for input(s): TSH, T4TOTAL, FREET4, T3FREE, THYROIDAB in the last 72 hours. Anemia Panel: No results for input(s): VITAMINB12, FOLATE, FERRITIN, TIBC, IRON, RETICCTPCT in the last 72 hours. Sepsis Labs: Recent Labs  Lab 07/10/19 1125 07/10/19 1135 07/10/19 1932  PROCALCITON  --   --  <0.10  LATICACIDVEN 0.5 1.2  --     Recent Results (from the past 240 hour(s))  Culture, blood (Routine x 2)     Status: None (Preliminary result)   Collection Time: 07/10/19 11:35 AM   Specimen: BLOOD  Result Value Ref Range Status   Specimen Description BLOOD BLOOD LEFT FOREARM  Final   Special Requests   Final    BOTTLES DRAWN AEROBIC AND ANAEROBIC Blood Culture results may not be optimal due to an excessive volume of blood received in culture bottles   Culture   Final    NO GROWTH < 24 HOURS Performed at [facility], [address], [place], [place] [address]    Report Status PENDING  Incomplete  Culture, blood (Routine X 2) w Reflex to ID Panel     Status: None (Preliminary result)   Collection Time: 07/10/19  5:43 PM   Specimen: BLOOD  Result Value Ref Range Status   Specimen Description BLOOD BLOOD LEFT WRIST  Final   Special Requests   Final    BOTTLES DRAWN AEROBIC AND ANAEROBIC Blood Culture adequate volume   Culture   Final    NO GROWTH < 12 HOURS Performed at [facility], [address] [place], [place] [address]    Report Status PENDING  Incomplete  SARS Coronavirus 2 by RT PCR (hospital order, performed in [facility] hospital lab) Nasopharyngeal Nasopharyngeal Swab     Status: None   Collection Time:  07/10/19  5:45 PM   Specimen: Nasopharyngeal Swab  Result Value Ref Range Status   SARS Coronavirus 2 NEGATIVE NEGATIVE Final    Comment: (NOTE) SARS-CoV-2 target nucleic acids are NOT DETECTED.  The SARS-CoV-2 RNA is generally detectable in upper and lower respiratory specimens during the acute phase of infection. The lowest concentration of SARS-CoV-2 viral copies this assay can detect is 250 copies / mL. A negative result does not preclude SARS-CoV-2 infection and should not be used as the sole basis for treatment or other patient management decisions.  A negative result may occur with improper specimen collection / handling, submission of specimen other than nasopharyngeal swab, presence of viral mutation(s) within the areas targeted by this assay, and inadequate number of viral copies (<250 copies / mL). A negative result must be combined with clinical observations, patient history, and epidemiological information.  Fact Sheet for Patients:   BoilerBrush.com.cy  Fact Sheet for Healthcare Providers: https://pope.com/  This test is not yet approved or  cleared by the [place] and has been authorized for detection and/or diagnosis of SARS-CoV-2 by FDA under an Emergency Use Authorization (EUA).  This EUA will remain in effect (meaning this test can be used) for the duration of the COVID-19 declaration under Section 564(b)(1) of the Act, 21 U.S.C. section 360bbb-3(b)(1), unless the authorization is terminated or revoked sooner.  Performed at [facility], [address] [place], [place] [address]       Radiology Studies: DG Chest 2 View  Result Date: 07/10/2019 CLINICAL DATA:  Sepsis EXAM: CHEST - 2 VIEW COMPARISON:  07/09/2019 FINDINGS: Heart size is normal. Atherosclerotic calcification of the aortic knob. Again seen are 2 large smoothly marginated masslike opacities within the left lung favored to reflect  loculated fluid within the major fissure. Small left pleural effusion. Increased interstitial opacity within the left lung base. Hyperexpanded lungs. No pneumothorax. IMPRESSION: 1. Small left pleural effusion with increased interstitial opacity within the left lung base, which may represent edema or infiltrate. 2. Two large smoothly marginated masslike opacities within the left lung, favored to reflect loculated fluid within the major fissure. These are similar in appearance to previous study. Electronically Signed   By: Duanne Guess D.O.   On: 07/10/2019 12:27   CT CHEST WO CONTRAST  Result Date: 07/11/2019 CLINICAL DATA:  COPD.  Shortness of breath EXAM: CT CHEST WITHOUT CONTRAST TECHNIQUE: Multidetector CT imaging of the chest was performed following the standard protocol without IV contrast. COMPARISON:  Multiple recent chest radiographs FINDINGS: Cardiovascular: No cardiomegaly or pericardial effusion. Atherosclerotic calcification Mediastinum/Nodes: Negative for adenopathy or mass. Lungs/Pleura: Patchy airspace disease in the dependent left lung. There are 2 ovoid opacities along the left major fissure which are new from May 2021 chest radiographs. The largest is more cranial and measures 6 x 3 cm and the more inferior is 5 x 3 cm. These have a somewhat lucent center and the uppermost has internal gas. There is a small dependent left pleural effusion which may have internal gas based on reformats. The right lung is clear. Hyperinflation and emphysema. Spiculated nodule in the left upper lobe measuring 12 x 8 mm on coronal reformats. Upper Abdomen: Negative for acute finding Musculoskeletal: No acute or aggressive finding IMPRESSION: 1. Two ovoid masses along the upper and lower left major fissure which have developed since chest radiographs in may 2021, most consistent with loculated pleural effusions. There is also dependent pleural fluid with possible superimposed gas. 2. Asymmetric reticulation in  the left lower lobe, presumably pneumonia. 3. 12 x 8 mm left upper lobe nodule. Consider one of the following in 3 months for both low-risk and high-risk individuals: (a) repeat chest CT, (b) follow-up PET-CT, or (c) tissue sampling. This recommendation follows the consensus statement: Guidelines for Management of Incidental Pulmonary Nodules Detected on CT Images: From the Fleischner Society 2017; Radiology 2017; 284:228-243. 4. Advanced emphysema. Electronically Signed   By: Marnee Spring M.D.   On: 07/11/2019 11:21        Scheduled Meds: . amLODipine  10 mg Oral Daily  . arformoterol  15 mcg Nebulization Q12H  . aspirin EC  81 mg Oral Daily  . atorvastatin  40 mg Oral Daily  . budesonide  0.5 mg Nebulization BID  . enoxaparin (LOVENOX) injection  40 mg Subcutaneous Q24H  . guaiFENesin  600 mg Oral BID  . loratadine  10 mg Oral Daily  . [START ON 07/12/2019] methylPREDNISolone (  SOLU-MEDROL) injection  40 mg Intravenous Q12H  . multivitamin with minerals  1 tablet Oral Daily  . pantoprazole  20 mg Oral Daily  . sertraline  100 mg Oral Daily   Continuous Infusions: . ampicillin-sulbactam (UNASYN) IV Stopped (07/11/19 1553)     Assessment/Plan:  1.  Acute on chronic hypoxic respiratory failure: Appears to be multifactorial with running out of oxygen, suboptimal recovery from recent hospitalization and pleural effusions/pneumonia noted on chest imaging  Patient currently back to baseline 3-4 L nasal cannula. Hemoptysis could be related to bronchitis versus bed cough bouts.  Continue Mucinex, cough suppressants, IV steroids (tapered down by pulmonary)..  No evidence of pneumothorax on chest x-ray.  Could not get contrasted CT due to patient's history of contrast allergy.  Seen by pulmonary and underwent CT without contrast with findings as above suggesting loculated pleural effusions possible left lower lobe infiltrate and a lung nodule-follow-up CT in 3 months recommended.   Pulmonary  recommends IV steroids for another day and will consider clearing for discharge home in a.m. if respiratory effort improved.  Home nebulizer treatments and pulmonary added morphine as needed as well as Unasyn for possible aspiration pneumonia.  Pro calcitonin less than 0.1 and sputum cultures ordered but not sent.  2.  Hypertension: -amlodipine  3.  Hyperlipidemia: Lipitor  4.  Anxiety/depression: Resume sertraline  5.  Tobacco use:  nicotine patch.  Quit few weeks back  DVT prophylaxis: Lovenox Code Status: Full code Family / Patient Communication: Discussed with patient in detail at bedside and all questions answered to satisfaction. Disposition Plan:   Status is: Inpatient  Remains inpatient appropriate because:IV treatments appropriate due to intensity of illness or inability to take PO IV steroids   Dispo: The patient is from: Home              Anticipated d/c is to: Home              Anticipated d/c date is: 1 day              Patient currently is not medically stable to d/c.  Discussed with pulmonary           LOS: 0 days    Time spent:     Guilford Shi, MD [facility] Pager in [place]  If 7PM-7AM, please contact night-coverage [email or web address] 07/11/2019, 6:19 PM

## 2019-07-11 NOTE — Progress Notes (Signed)
Pharmacy Antibiotic Note  Alicia Gibson is a 59 y.o. female admitted on 07/10/2019.  Pharmacy has been consulted for Unasyn dosing.  Plan: Unasyn 3 g IV q6h  Height: 5\' 6"  (167.6 cm) Weight: 64 kg (141 lb 1.5 oz) IBW/kg (Calculated) : 59.3  Temp (24hrs), Avg:98.3 F (36.8 C), Min:98.2 F (36.8 C), Max:98.3 F (36.8 C)  Recent Labs  Lab 07/10/19 1125 07/10/19 1135 07/11/19 0611  WBC  --  7.1  --   CREATININE  --  0.59 0.55  LATICACIDVEN 0.5 1.2  --     Estimated Creatinine Clearance: 72.6 mL/min (by C-G formula based on SCr of 0.55 mg/dL).    Allergies  Allergen Reactions  . Contrast Allergy Premed Pack [Prednisone & Diphenhydramine] Anaphylaxis    Patient states she had iv contrast in 2008 which caused her throat to close and difficulty breathing.   . Iodine Anaphylaxis  . Codeine Hives and Nausea And Vomiting  . Shellfish Allergy Hives  . Penicillins Hives and Rash    Antimicrobials this admission: Unasyn 6/17 >>   Microbiology results: 6/16 BCx: NGTD   Thank you for allowing pharmacy to be a part of this patient's care.  7/16, PharmD 07/11/2019 2:14 PM

## 2019-07-12 DIAGNOSIS — Z7189 Other specified counseling: Secondary | ICD-10-CM

## 2019-07-12 DIAGNOSIS — Z515 Encounter for palliative care: Secondary | ICD-10-CM

## 2019-07-12 DIAGNOSIS — J690 Pneumonitis due to inhalation of food and vomit: Secondary | ICD-10-CM

## 2019-07-12 DIAGNOSIS — R0602 Shortness of breath: Secondary | ICD-10-CM

## 2019-07-12 MED ORDER — SODIUM CHLORIDE 0.9 % IV SOLN
INTRAVENOUS | Status: DC | PRN
  Administered 2019-07-12 – 2019-07-13 (×2): 250 mL via INTRAVENOUS

## 2019-07-12 MED ORDER — MORPHINE SULFATE (CONCENTRATE) 10 MG/0.5ML PO SOLN: 5.0000 mg | ORAL | Status: DC | PRN

## 2019-07-12 NOTE — Progress Notes (Addendum)
PROGRESS NOTE    Alicia Gibson  TDD:[id number]  DOB: 1960/07/06  PCP: [name], FNP Admit date:07/10/2019 59 y.o. female with history of hypertension, hyperlipidemia, depression/anxiety, COPD on nasal cannula O2 who follows Dr Belia Heman, was recently [facility] to [facility] from 5/29 - 06/25/19 for acute on chronic hypoxic respiratory failure due to COPD exacerbation as well as spontaneous left pneumothorax requiring chest tube placement and subsequently discharged home on 3 L nasal cannula, presented to pulmonary clinic today for post discharge follow-up.  She reported persistent dyspnea to pulmonologist Dr. Jayme Cloud and was noted to be significantly dyspneic as well as hypoxic with O2 sat 66% on room air (due to O2 tank being empty) and only improved to 80% on 8 L O2. She had to be placed on nonrebreather mask at 15 L O2 in the pulmonary clinic and transported to the ED for further evaluation/admission for COPD exacerbation.  Chest x-ray on 6/15 had shown severe hyperinflation and loculated fluid in the left fissure as well as small left pleural effusion.Patient states she was advised 3.5 to 4 L continuous O2 upon last discharge, however has felt progressive dyspnea with reduced exercise tolerance since then. ED Course: Afebrile, labs okay.Repeat chest x-ray revealed Small left pleural effusion with increased interstitial opacity within the left lung base, which may represent edema or infiltrate.Two large smoothly marginated masslike opacities within the left lung, favored to reflect loculated fluid within the major fissure, similar in appearance to previous study. Hospital course: Patient admitted to [facility] for further evaluation and management.  Subjective: Patient feels somewhat better today but not quite at baseline.  Reports dyspnea with minimal exertion.  Was tearful and frustrated yesterday regarding overall decline in her health.  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  She is agreeable for palliative care evaluation for symptom management at home.  Objective: Vitals:   07/12/19 0754 07/12/19 0927 07/12/19 1455 07/12/19 1951  BP:  122/80 123/64 130/61  Pulse:  90 81 88  Resp:  20 18 20   Temp:  97.7 F (36.5 C) 97.9 F (36.6 C) 98.3 F (36.8 C)  TempSrc:      SpO2: 93% 95% 96% 95%  Weight:      Height:        Intake/Output Summary (Last 24 hours) at 07/12/2019 2009 Last data filed at 07/12/2019 1850 Gross per 24 hour  Intake 800 ml  Output 1250 ml  Net -450 ml   Filed Weights   07/10/19 1122  Weight: 64 kg    Physical Examination:  General exam: Appears dyspneic while talking full sentences with pursed lip breathing, on O2 nasal cannula. Respiratory system: Respiratory effort somewhat increased while talking full sentences, some pursed lip breathing, poor air entry on auscultation bilaterally Cardiovascular system: S1 & S2 heard, RRR. No JVD, murmurs. No pedal edema. Gastrointestinal system: Abdomen is nondistended, soft and nontender. Normal bowel sounds heard. Central nervous system: Alert and oriented. No new focal neurological deficits. Extremities: No contractures, edema or joint deformities.  Skin: No rashes, lesions or ulcers Psychiatry: Judgement and insight appear normal. Mood & affect anxious  Data Reviewed: I have personally reviewed following labs and imaging studies  CBC: Recent Labs  Lab 07/10/19 1135  WBC 7.1  NEUTROABS 5.0  HGB 10.4*  HCT 34.0*  MCV 94.7  PLT 322   Basic Metabolic Panel: Recent Labs  Lab 07/10/19 1135 07/11/19 0611  NA 139 139  K 4.4 3.9  CL  88* 91*  CO2 38* 36*  GLUCOSE 124* 158*  BUN 14 15  CREATININE 0.59 0.55  CALCIUM 9.2 9.1   GFR: Estimated Creatinine Clearance: 72.6 mL/min (by C-G formula based on SCr of 0.55 mg/dL). Liver Function Tests: Recent Labs  Lab 07/10/19 1135  AST 22  ALT 21  ALKPHOS 93  BILITOT 0.5  PROT 7.3  ALBUMIN 3.6   No results for  input(s): LIPASE, AMYLASE in the last 168 hours. No results for input(s): AMMONIA in the last 168 hours. Coagulation Profile: Recent Labs  Lab 07/10/19 1135  INR 0.9   Cardiac Enzymes: No results for input(s): CKTOTAL, CKMB, CKMBINDEX, TROPONINI in the last 168 hours. BNP (last 3 results) No results for input(s): PROBNP in the last 8760 hours. HbA1C: No results for input(s): HGBA1C in the last 72 hours. CBG: No results for input(s): GLUCAP in the last 168 hours. Lipid Profile: No results for input(s): CHOL, HDL, LDLCALC, TRIG, CHOLHDL, LDLDIRECT in the last 72 hours. Thyroid Function Tests: No results for input(s): TSH, T4TOTAL, FREET4, T3FREE, THYROIDAB in the last 72 hours. Anemia Panel: No results for input(s): VITAMINB12, FOLATE, FERRITIN, TIBC, IRON, RETICCTPCT in the last 72 hours. Sepsis Labs: Recent Labs  Lab 07/10/19 1125 07/10/19 1135 07/10/19 1932  PROCALCITON  --   --  <0.10  LATICACIDVEN 0.5 1.2  --     Recent Results (from the past 240 hour(s))  Culture, blood (Routine x 2)     Status: None (Preliminary result)   Collection Time: 07/10/19 11:35 AM   Specimen: BLOOD  Result Value Ref Range Status   Specimen Description BLOOD BLOOD LEFT FOREARM  Final   Special Requests   Final    BOTTLES DRAWN AEROBIC AND ANAEROBIC Blood Culture results may not be optimal due to an excessive volume of blood received in culture bottles   Culture   Final    NO GROWTH 2 DAYS Performed at [facility], [address], [place], [place] [address]    Report Status PENDING  Incomplete  Culture, blood (Routine X 2) w Reflex to ID Panel     Status: None (Preliminary result)   Collection Time: 07/10/19  5:43 PM   Specimen: BLOOD  Result Value Ref Range Status   Specimen Description BLOOD BLOOD LEFT WRIST  Final   Special Requests   Final    BOTTLES DRAWN AEROBIC AND ANAEROBIC Blood Culture adequate volume   Culture   Final    NO GROWTH 2 DAYS Performed at [facility], [address], [place],  [address]    Report Status PENDING  Incomplete  SARS Coronavirus 2 by RT PCR (hospital order, performed in [place] hospital lab) Nasopharyngeal Nasopharyngeal Swab     Status: None   Collection Time: 07/10/19  5:45 PM   Specimen: Nasopharyngeal Swab  Result Value Ref Range Status   SARS Coronavirus 2 NEGATIVE NEGATIVE Final    Comment: (NOTE) SARS-CoV-2 target nucleic acids are NOT DETECTED.  The SARS-CoV-2 RNA is generally detectable in upper and lower respiratory specimens during the acute phase of infection. The lowest concentration of SARS-CoV-2 viral copies this assay can detect is 250 copies / mL. A negative result does not preclude SARS-CoV-2 infection and should not be used as the sole basis for treatment or other patient management decisions.  A negative result may occur with improper specimen collection / handling, submission of specimen other than nasopharyngeal swab, presence of viral mutation(s) within the areas targeted by  this assay, and inadequate number of viral copies (<250 copies / mL). A negative result must be combined with clinical observations, patient history, and epidemiological information.  Fact Sheet for Patients:   BoilerBrush.com.cy  Fact Sheet for Healthcare Providers: https://pope.com/  This test is not yet approved or  cleared by the [place] FDA and has been authorized for detection and/or diagnosis of SARS-CoV-2 by FDA under an Emergency Use Authorization (EUA).  This EUA will remain in effect (meaning this test can be used) for the duration of the COVID-19 declaration under Section 564(b)(1) of the Act, 21 U.S.C. section 360bbb-3(b)(1), unless the authorization is terminated or revoked sooner.  Performed at [facility], [address], [place], [place] [address]       Radiology Studies: CT CHEST WO CONTRAST  Result Date:  07/11/2019 CLINICAL DATA:  COPD.  Shortness of breath EXAM: CT CHEST WITHOUT CONTRAST TECHNIQUE: Multidetector CT imaging of the chest was performed following the standard protocol without IV contrast. COMPARISON:  Multiple recent chest radiographs FINDINGS: Cardiovascular: No cardiomegaly or pericardial effusion. Atherosclerotic calcification Mediastinum/Nodes: Negative for adenopathy or mass. Lungs/Pleura: Patchy airspace disease in the dependent left lung. There are 2 ovoid opacities along the left major fissure which are new from May 2021 chest radiographs. The largest is more cranial and measures 6 x 3 cm and the more inferior is 5 x 3 cm. These have a somewhat lucent center and the uppermost has internal gas. There is a small dependent left pleural effusion which may have internal gas based on reformats. The right lung is clear. Hyperinflation and emphysema. Spiculated nodule in the left upper lobe measuring 12 x 8 mm on coronal reformats. Upper Abdomen: Negative for acute finding Musculoskeletal: No acute or aggressive finding IMPRESSION: 1. Two ovoid masses along the upper and lower left major fissure which have developed since chest radiographs in may 2021, most consistent with loculated pleural effusions. There is also dependent pleural fluid with possible superimposed gas. 2. Asymmetric reticulation in the left lower lobe, presumably pneumonia. 3. 12 x 8 mm left upper lobe nodule. Consider one of the following in 3 months for both low-risk and high-risk individuals: (a) repeat chest CT, (b) follow-up PET-CT, or (c) tissue sampling. This recommendation follows the consensus statement: Guidelines for Management of Incidental Pulmonary Nodules Detected on CT Images: From the Fleischner Society 2017; Radiology 2017; 284:228-243. 4. Advanced emphysema. Electronically Signed   By: Marnee Spring M.D.   On: 07/11/2019 11:21        Scheduled Meds:  amLODipine  10 mg Oral Daily   arformoterol  15 mcg  Nebulization Q12H   aspirin EC  81 mg Oral Daily   atorvastatin  40 mg Oral Daily   budesonide  0.5 mg Nebulization BID   enoxaparin (LOVENOX) injection  40 mg Subcutaneous Q24H   guaiFENesin  600 mg Oral BID   loratadine  10 mg Oral Daily   methylPREDNISolone (SOLU-MEDROL) injection  40 mg Intravenous Q12H   multivitamin with minerals  1 tablet Oral Daily   pantoprazole  20 mg Oral Daily   sertraline  100 mg Oral Daily   Continuous Infusions:  ampicillin-sulbactam (UNASYN) IV 3 g (07/12/19 1452)     Assessment/Plan:  1.  Acute on chronic hypoxic respiratory failure: Appears to be multifactorial with running out of oxygen, suboptimal recovery from recent hospitalization and pleural effusions/pneumonia noted on chest imaging  Patient currently back to baseline 3-4 L nasal cannula. Hemoptysis could be related to  bronchitis versus bed cough bouts.  Continue Mucinex, cough suppressants, IV steroids (tapered down by pulmonary)..  No evidence of pneumothorax on chest x-ray.  Could not get contrasted CT due to patient's history of contrast allergy.  Seen by pulmonary and underwent CT without contrast with findings as above suggesting loculated pleural effusions possible left lower lobe infiltrate and a lung nodule-follow-up CT in 3 months recommended.   Will continue IV steroids for another day and obtain palliative care evaluation for symptom management (possibly morphine for air hunger) and care goal discussion.  Can likely go home in a.m. with or without home palliative care.  Home nebulizer treatments and pulmonary added Unasyn for possible aspiration pneumonia-will transition to Augmentin in am.  Pro calcitonin less than 0.1 and sputum cultures ordered but not sent.  2.  Hypertension: -amlodipine  3.  Hyperlipidemia: Lipitor  4.  Anxiety/depression: Resume sertraline  5.  Tobacco use:  nicotine patch.  Quit few weeks back  DVT prophylaxis: Lovenox Code Status: Full  code Family / Patient Communication: Discussed with patient in detail at bedside and all questions answered to satisfaction. Disposition Plan:   Status is: Inpatient  Remains inpatient appropriate because:IV treatments appropriate due to intensity of illness or inability to take PO IV steroids   Dispo: The patient is from: Home              Anticipated d/c is to: Home              Anticipated d/c date is: 1 day with O2, p.o. prednisone, antibiotics, palliative care              Patient currently is not medically stable to d/c.  Discussed with pulmonary           LOS: 1 day    Time spent: 25 minutes    Alessandra Bevels, MD [facility] Pager in [place]  If 7PM-7AM, please contact night-coverage [email or web address] 07/12/2019, 8:09 PM

## 2019-07-12 NOTE — Consult Note (Signed)
Consultation Note Date: 07/12/2019   Patient Name: Alicia Gibson  DOB: 02/21/1960  MRN: [id number]  Age / Sex: 59 y.o., female  PCP: Lorine Bears Lupita Raider, FNP Referring Physician: Guilford Shi, MD  Reason for Consultation: Establishing goals of care  HPI/Patient Profile: 59 y.o. female  with past medical history of HTN, HLD, depression, anxiety, and COPD admitted on 07/10/2019 with COPD exacerbation and pneumonia.  Patient recently hospitalized 5/29-6/1 for respiratory failure. PMT consulted to discuss [place].  Clinical Assessment and Goals of Care: I have reviewed medical records including EPIC notes, labs and imaging, assessed the patient and then met with patient  to discuss diagnosis prognosis, GOC, EOL wishes, disposition and options.  I introduced Palliative Medicine as specialized medical care for people living with serious illness. It focuses on providing relief from the symptoms and stress of a serious illness. The goal is to improve quality of life for both the patient and the family.  We discussed a brief life review of the patient. She tells me she moved to [facility] from [place]. She tells me about her support systems here - her best friend she lives with and her ex husband. She tells me about her other family she is close to. 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   As far as functional and nutritional status, she tells me her activity is limited d/t shortness of breath. She also tells me she is a bit unsteady on her feet. She does use a walker some - admits she needs to use it more. She speaks of muscle loss. She tells me she becomes exhausted with very little physical activity.    We discussed patient's current illness and what it means in the larger context of patient's on-going co-morbidities.  Natural disease trajectory and expectations at EOL were discussed. She has a good understanding of her lung disease and speaks of her  poor prognosis.   I attempted to elicit values and goals of care important to the patient.  She wants to be at home with her best friend.  The difference between aggressive medical intervention and comfort care was considered in light of the patient's goals of care. Laren is interested in continuing to pursue aggressive medical care.   When I attempt to discuss advance directives Brennley is somewhat overwhelmed. She is also unsure about completing HCPOA documentation - she tells me she would want her best friend Shirlean Mylar and her sisters Collie Siad and Malachy Mood to all serve as decision makers together, but does not want to order them as HCPOAs. She declines chaplain visit for advance directive completion. We briefly discussed code status - she tells me she does wish for a DNR but is unsure if her family agrees and would like time to discuss this with them. She does state she would never want a tracheostomy.   Discussed with patient/family the importance of continued conversation with family and the medical providers regarding overall plan of care and treatment options, ensuring decisions are within the context of the patient's values and GOCs.    Palliative Care services outpatient were explained and offered. Tylie is agreeable to palliative care seeing her at home.   We discussed symptom management - she complains of shortness of breath. She has had good response to roxanol before and agrees to try here.   Questions and concerns were addressed. The family was encouraged to call with questions or concerns.   Primary Decision Maker PATIENT  She requests her best friend Shirlean Mylar  and sisters Collie Siad and Malachy Mood serve as Engineer, structural if she were unable but she is reluctant to complete HCPOA documentation  SUMMARY OF RECOMMENDATIONS   Palliative care to see outpatient Full code for now - would never want trach Encouraged patient to further discuss wishes with family and complete advance directives  Code  Status/Advance Care Planning:  Full code   Symptom Management:   Add roxanol 5 mg q4hr PRN shortness of breath  Discharge Planning: Home with Palliative Services      Primary Diagnoses: Present on Admission: . Pulmonary emphysema ([place]) . Pneumothorax on left . Essential hypertension . COPD exacerbation ([place]) . Chronic respiratory failure ([place]) . Anxiety and depression . Acute on chronic respiratory failure with hypoxia ([place]) . Acute respiratory failure with hypoxia ([place])   I have reviewed the medical record, interviewed the patient and family, and examined the patient. The following aspects are pertinent.  Past Medical History:  Diagnosis Date  . Allergy   . Anxiety   . Colitis    recurrent  . COPD (chronic obstructive pulmonary disease) ([place])   . Depression   . Graves disease    s/p iodine radiation treatment - euthyroid for many years per pt  . Hyperlipidemia   . Hypertension   . Loss of eye, LEFT 2007   Social History   Socioeconomic History  . Marital status: Divorced    Spouse name: Not on file  . Number of children: Not on file  . Years of education: Not on file  . Highest education level: GED or equivalent  Occupational History  . Not on file  Tobacco Use  . Smoking status: Former Smoker    Packs/day: 0.10    Types: Cigarettes  . Smokeless tobacco: Never Used  Vaping Use  . Vaping Use: Never used  Substance and Sexual Activity  . Alcohol use: Never  . Drug use: Never  . Sexual activity: Not Currently  Other Topics Concern  . Not on file  Social History Narrative  . Not on file   Social Determinants of Health   Financial Resource Strain:   . Difficulty of Paying Living Expenses:   Food Insecurity:   . Worried About Charity fundraiser in the Last Year:   . Arboriculturist in the Last Year:   Transportation Needs:   . Film/video editor (Medical):   Marland Kitchen Lack of Transportation (Non-Medical):   Physical Activity:   . Days of Exercise  per Week:   . Minutes of Exercise per Session:   Stress:   . Feeling of Stress :   Social Connections:   . Frequency of Communication with Friends and Family:   . Frequency of Social Gatherings with Friends and Family:   . Attends Religious Services:   . Active Member of Clubs or Organizations:   . Attends Archivist Meetings:   Marland Kitchen Marital Status:    Family History  Problem Relation Age of Onset  . Heart disease Father   . Alcohol abuse Father   . Alcohol abuse Brother   . Heart failure Sister   . Celiac disease Sister   . Healthy Sister    Scheduled Meds: . amLODipine  10 mg Oral Daily  . arformoterol  15 mcg Nebulization Q12H  . aspirin EC  81 mg Oral Daily  . atorvastatin  40 mg Oral Daily  . budesonide  0.5 mg Nebulization BID  . enoxaparin (LOVENOX) injection  40 mg Subcutaneous Q24H  .  guaiFENesin  600 mg Oral BID  . loratadine  10 mg Oral Daily  . methylPREDNISolone (SOLU-MEDROL) injection  40 mg Intravenous Q12H  . multivitamin with minerals  1 tablet Oral Daily  . pantoprazole  20 mg Oral Daily  . sertraline  100 mg Oral Daily   Continuous Infusions: . ampicillin-sulbactam (UNASYN) IV 3 g (07/12/19 1452)   PRN Meds:.acetaminophen **OR** acetaminophen, albuterol, cyclobenzaprine, hydrOXYzine, oxyCODONE Allergies  Allergen Reactions  . Contrast Allergy Premed Pack [Prednisone & Diphenhydramine] Anaphylaxis    Patient states she had iv contrast in 2008 which caused her throat to close and difficulty breathing.   . Iodine Anaphylaxis  . Codeine Hives and Nausea And Vomiting  . Shellfish Allergy Hives  . Penicillins Hives and Rash   Review of Systems  Constitutional: Positive for activity change and fatigue.  Respiratory: Positive for shortness of breath.     Physical Exam Constitutional:      General: She is not in acute distress. Pulmonary:     Effort: Pulmonary effort is normal.  Musculoskeletal:     Right lower leg: No edema.     Left lower  leg: No edema.  Skin:    General: Skin is warm and dry.  Neurological:     Mental Status: She is alert and oriented to person, place, and time.     Vital Signs: BP 123/64 (BP Location: Right Arm)   Pulse 81   Temp 97.9 F (36.6 C)   Resp 18   Ht _0  (1.676 m)   Wt 64 kg   SpO2 96%   BMI 22.77 kg/m  Pain Scale: 0-10 POSS *See Group Information*: 1-Acceptable,Awake and alert Pain Score: 8    SpO2: SpO2: 96 % O2 Device:SpO2: 96 % O2 Flow Rate: .O2 Flow Rate (L/min): 3 L/min  IO: Intake/output summary:   Intake/Output Summary (Last 24 hours) at 07/12/2019 1613 Last data filed at 07/12/2019 1355 Gross per 24 hour  Intake 560 ml  Output 400 ml  Net 160 ml    LBM: Last BM Date: 07/09/19 Baseline Weight: Weight: 64 kg Most recent weight: Weight: 64 kg     Palliative Assessment/Data: PPS 50%    Time Total: 50 minutes Greater than 50%  of this time was spent counseling and coordinating care related to the above assessment and plan.  Juel Burrow, DNP, AGNP-C Palliative Medicine Team [phone] Pager: [phone]

## 2019-07-12 NOTE — Progress Notes (Signed)
[facility] Liaison note:  New referral for Palliative to follow at home post discharge received from Palliative NP Harvest Dark. TOC  Stephanie Bowen notified. Patient information sent to referral. Thank you. Dayna Barker BSN, [facility] [employer] [phone]

## 2019-07-12 NOTE — Progress Notes (Signed)
Pharmacy Antibiotic Note  Alicia Gibson is a 59 y.o. female admitted on 07/10/2019.  Pharmacy has been consulted for Unasyn dosing.  Antibiotics Day 2  Plan: Continue Unasyn 3 g IV q6h  Height: 5\' 6"  (167.6 cm) Weight: 64 kg (141 lb 1.5 oz) IBW/kg (Calculated) : 59.3  Temp (24hrs), Avg:98.2 F (36.8 C), Min:97.8 F (36.6 C), Max:98.5 F (36.9 C)  Recent Labs  Lab 07/10/19 1125 07/10/19 1135 07/11/19 0611  WBC  --  7.1  --   CREATININE  --  0.59 0.55  LATICACIDVEN 0.5 1.2  --     Estimated Creatinine Clearance: 72.6 mL/min (by C-G formula based on SCr of 0.55 mg/dL).    Allergies  Allergen Reactions  . Contrast Allergy Premed Pack [Prednisone & Diphenhydramine] Anaphylaxis    Patient states she had iv contrast in 2008 which caused her throat to close and difficulty breathing.   . Iodine Anaphylaxis  . Codeine Hives and Nausea And Vomiting  . Shellfish Allergy Hives  . Penicillins Hives and Rash    Antimicrobials this admission: Unasyn 6/17 >>   Microbiology results: 6/16 BCx: NGTD   Thank you for allowing pharmacy to be a part of this patient's care.  7/16, PharmD 07/12/2019 7:58 AM

## 2019-07-12 NOTE — TOC Initial Note (Signed)
Transition of Care [facility]) - Initial/Assessment Note    Patient Details  Name: Alicia Gibson MRN: [id number] Date of Birth: [birth year]  Transition of [facility]) CM/SW Contact:    Chapman Fitch, RN Phone Number: 07/12/2019, 1:30 PM  Clinical Narrative:                 Patient admitted from home with respiratory failure.  Patient lives at home with roommate. Roommate provides transportation.  Will pick patient up at discharge, and bring portable O2.   PCP [facility]- Patient denies issues obtaining medications  Patent has continuous Home O2 at 3L, working nebulizer, and rolling walker. Patient confirms she has scales in order to check her weight   Previous admission RNCM attempted to set up home health services, however was unable ato staff the case.  Due to CHF and COPD patient would benefit from having home health services.  She is again agreeable to home health services if I can find an agency that is able to accept her insurance.  At this time [facility] [place], [facility], [facility], [facility], [facility], [facility] unable to accept patient.  TOC leadership notified        Patient Goals and CMS Choice        Expected Discharge Plan and Services         Living arrangements for the past 2 months: Single Family Home                                      Prior Living Arrangements/Services Living arrangements for the past 2 months: Single Family Home Lives with:: Roommate Patient language and need for interpreter reviewed:: Yes Do you feel safe going back to the place where you live?: Yes      Need for Family Participation in Patient Care: Yes (Comment) Care giver support system in place?: Yes (comment) Current home services: DME Criminal Activity/Legal Involvement Pertinent to Current Situation/Hospitalization: No - Comment as needed  Activities of Daily Living Home Assistive Devices/Equipment: Oxygen, Walker (specify type) ADL  Screening (condition at time of admission) Patient's cognitive ability adequate to safely complete daily activities?: Yes Is the patient deaf or have difficulty hearing?: No Does the patient have difficulty seeing, even when wearing glasses/contacts?: No Does the patient have difficulty concentrating, remembering, or making decisions?: No Patient able to express need for assistance with ADLs?: Yes Does the patient have difficulty dressing or bathing?: No Independently performs ADLs?: Yes (appropriate for developmental age) Does the patient have difficulty walking or climbing stairs?: Yes Weakness of Legs: Both Weakness of Arms/Hands: Both  Permission Sought/Granted                  Emotional Assessment Appearance:: Appears older than stated age     Orientation: : Oriented to Self, Oriented to Place, Oriented to  Time, Oriented to Situation      Admission diagnosis:  COPD exacerbation (HCC) [J44.1] Acute respiratory failure with hypoxia (HCC) [J96.01] Acute on chronic respiratory failure with hypoxia (HCC) [J96.21] Patient Active Problem List   Diagnosis Date Noted  . Acute respiratory failure with hypoxia (HCC) 07/10/2019  . Palliative care patient 07/09/2019  . Enrolled in chronic care management 07/09/2019  . Acute on chronic respiratory failure with hypoxia (HCC) 06/23/2019  . COPD exacerbation (HCC) 06/22/2019  . Chronic respiratory failure (HCC) 06/22/2019  . Elevated troponin 06/22/2019  . Pneumothorax on  left 06/22/2019  . Pneumothorax 06/22/2019  . Chronic bronchitis with COPD (chronic obstructive pulmonary disease) ([place]) 05/14/2018  . Anxiety and depression 05/14/2018  . Elevated lipids 05/14/2018  . Seasonal allergic rhinitis due to pollen 05/14/2018  . Essential hypertension 05/14/2018  . Pulmonary emphysema ([place]) 06/23/2015   PCP:  Verl Bangs, FNP Pharmacy:   [facility] [phone] [address], [address] [place] [address] Phone: [phone] Fax: [phone]     Social Determinants of Health (SDOH) Interventions    Readmission Risk Interventions No flowsheet data found.

## 2019-07-13 DIAGNOSIS — F329 Major depressive disorder, single episode, unspecified: Secondary | ICD-10-CM

## 2019-07-13 DIAGNOSIS — F419 Anxiety disorder, unspecified: Secondary | ICD-10-CM

## 2019-07-13 DIAGNOSIS — J9611 Chronic respiratory failure with hypoxia: Secondary | ICD-10-CM

## 2019-07-13 MED ORDER — AMOXICILLIN-POT CLAVULANATE 875-125 MG PO TABS
1.0000 | ORAL_TABLET | Freq: Two times a day (BID) | ORAL | 0 refills | Status: AC
Start: 2019-07-13 — End: 2019-07-23

## 2019-07-13 MED ORDER — BUDESONIDE 0.5 MG/2ML IN SUSP
0.5000 mg | Freq: Two times a day (BID) | RESPIRATORY_TRACT | Status: DC
  Administered 2019-07-13: 0.5 mg via RESPIRATORY_TRACT
  Filled 2019-07-13: qty 2

## 2019-07-13 MED ORDER — PREDNISONE 10 MG PO TABS
ORAL_TABLET | ORAL | 0 refills | Status: DC
Start: 2019-07-13 — End: 2019-08-04

## 2019-07-13 MED ORDER — MORPHINE SULFATE (CONCENTRATE) 10 MG/0.5ML PO SOLN: 5.0000 mg | ORAL | 0 refills | Status: DC | PRN

## 2019-07-13 NOTE — Discharge Instructions (Signed)
Chronic Obstructive Pulmonary Disease Chronic obstructive pulmonary disease (COPD) is a long-term (chronic) lung problem. When you have COPD, it is hard for air to get in and out of your lungs. Usually the condition gets worse over time, and your lungs will never return to normal. There are things you can do to keep yourself as healthy as possible.  Your doctor may treat your condition with: ? Medicines. ? Oxygen. ? Lung surgery.  Your doctor may also recommend: ? Rehabilitation. This includes steps to make your body work better. It may involve a team of specialists. ? Quitting smoking, if you smoke. ? Exercise and changes to your diet. ? Comfort measures (palliative care). Follow these instructions at home: Medicines  Take over-the-counter and prescription medicines only as told by your doctor.  Talk to your doctor before taking any cough or allergy medicines. You may need to avoid medicines that cause your lungs to be dry. Lifestyle  If you smoke, stop. Smoking makes the problem worse. If you need help quitting, ask your doctor.  Avoid being around things that make your breathing worse. This may include smoke, chemicals, and fumes.  Stay active, but remember to rest as well.  Learn and use tips on how to relax.  Make sure you get enough sleep. Most adults need at least 7 hours of sleep every night.  Eat healthy foods. Eat smaller meals more often. Rest before meals. Controlled breathing Learn and use tips on how to control your breathing as told by your doctor. Try:  Breathing in (inhaling) through your nose for 1 second. Then, pucker your lips and breath out (exhale) through your lips for 2 seconds.  Putting one hand on your belly (abdomen). Breathe in slowly through your nose for 1 second. Your hand on your belly should move out. Pucker your lips and breathe out slowly through your lips. Your hand on your belly should move in as you breathe out.  Controlled coughing Learn  and use controlled coughing to clear mucus from your lungs. Follow these steps: 1. Lean your head a little forward. 2. Breathe in deeply. 3. Try to hold your breath for 3 seconds. 4. Keep your mouth slightly open while coughing 2 times. 5. Spit any mucus out into a tissue. 6. Rest and do the steps again 1 or 2 times as needed. General instructions  Make sure you get all the shots (vaccines) that your doctor recommends. Ask your doctor about a flu shot and a pneumonia shot.  Use oxygen therapy and pulmonary rehabilitation if told by your doctor. If you need home oxygen therapy, ask your doctor if you should buy a tool to measure your oxygen level (oximeter).  Make a COPD action plan with your doctor. This helps you to know what to do if you feel worse than usual.  Manage any other conditions you have as told by your doctor.  Avoid going outside when it is very hot, cold, or humid.  Avoid people who have a sickness you can catch (contagious).  Keep all follow-up visits as told by your doctor. This is important. Contact a doctor if:  You cough up more mucus than usual.  There is a change in the color or thickness of the mucus.  It is harder to breathe than usual.  Your breathing is faster than usual.  You have trouble sleeping.  You need to use your medicines more often than usual.  You have trouble doing your normal activities such as getting dressed   or walking around the house. Get help right away if:  You have shortness of breath while resting.  You have shortness of breath that stops you from: ? Being able to talk. ? Doing normal activities.  Your chest hurts for longer than 5 minutes.  Your skin color is more blue than usual.  Your pulse oximeter shows that you have low oxygen for longer than 5 minutes.  You have a fever.  You feel too tired to breathe normally. Summary  Chronic obstructive pulmonary disease (COPD) is a long-term lung problem.  The way your  lungs work will never return to normal. Usually the condition gets worse over time. There are things you can do to keep yourself as healthy as possible.  Take over-the-counter and prescription medicines only as told by your doctor.  If you smoke, stop. Smoking makes the problem worse. This information is not intended to replace advice given to you by your health care provider. Make sure you discuss any questions you have with your health care provider. Document Revised: 12/23/2016 Document Reviewed: 02/15/2016 Elsevier Patient Education  2020 Elsevier Inc.  

## 2019-07-13 NOTE — Progress Notes (Signed)
Physical Therapy Evaluation Patient Details Name: Alicia Gibson MRN: [id number] DOB: 18-Jan-1961 Today's Date: 07/13/2019   History of Present Illness  Per MD note:Alicia Gibson is a 59 y.o. female with history of hypertension, hyperlipidemia, depression/anxiety, COPD on nasal cannula O2 who follows Dr Belia Heman, was recently [facility] to [facility] from [facility] - 06/25/19 for acute on chronic hypoxic respiratory failure due to COPD exacerbation as well as spontaneous left pneumothorax requiring chest tube placement and subsequently discharged home on 3 L nasal cannula, presented to pulmonary clinic today for post discharge follow-up.  She reported persistent dyspnea to pulmonologist Dr. Jayme Cloud and was noted to be significantly dyspneic as well as hypoxic with O2 sat 66% on room air (due to O2 tank being empty) and only improved to 80% on 8 L O2.  Patient states when she got out of the house she got along a portable tank that was empty and that she did not realize.  She had to be placed on nonrebreather mask at 15 L O2 in the pulmonary clinic and transported to the ED for further evaluation/admission for COPD exacerbation.  Chest x-ray on 6/15 had shown severe hyperinflation and loculated fluid in the left fissure as well as small left pleural effusion.  Clinical Impression  Patient agrees to PT evaluation. Patient is MI for bed mobility and Mi for sit to stand transfer with RW. She ambulates 15 feet with decrease in O2 saturation below 90% and then rises to 89% after several minutes of recovering. She ambulates with RW 15 feet with O2 tubing. She has good standing balance and sitting balance. She will continue to benefit from skilled PT to improve mobility and strength.    Follow Up Recommendations Home health PT    Equipment Recommendations  None recommended by PT    Recommendations for Other Services       Precautions / Restrictions Restrictions Weight Bearing Restrictions: No       Mobility  Bed Mobility Overal bed mobility: Modified Independent Bed Mobility: Supine to Sit;Sit to Supine     Supine to sit: Modified independent (Device/Increase time) Sit to supine: Modified independent (Device/Increase time)      Transfers Overall transfer level: Modified independent Equipment used: None Transfers: Sit to/from Stand Sit to Stand: Modified independent (Device/Increase time) Stand pivot transfers: Modified independent (Device/Increase time)       General transfer comment: cues for safety  Ambulation/Gait Ambulation/Gait assistance: Supervision Gait Distance (Feet): 15 Feet Assistive device: Rolling walker (2 wheeled) Gait Pattern/deviations: Step-to pattern        Stairs            Wheelchair Mobility    Modified Rankin (Stroke Patients Only)       Balance Overall balance assessment: Needs assistance Sitting-balance support: No upper extremity supported;Feet supported Sitting balance-Leahy Scale: Good     Standing balance support: Single extremity supported;During functional activity Standing balance-Leahy Scale: Fair                               Pertinent Vitals/Pain Pain Assessment: Faces Pain Score: 7  Faces Pain Scale: Hurts a little bit Pain Location: chest Pain Descriptors / Indicators: Aching;Discomfort Pain Intervention(s): Limited activity within patient's tolerance;Monitored during session    Home Living Family/patient expects to be discharged to:: Private residence Living Arrangements: Other relatives;Non-relatives/Friends (roommate, sister trying to come stay at d/c ) Available Help at Discharge: Friend(s);Available PRN/intermittently Type of Home: House  Home Access: Ramped entrance;Stairs to enter   Entrance Stairs-Number of Steps: 4 Home Layout: One level Home Equipment: Grab bars - tub/shower      Prior Function Level of Independence: Independent with assistive device(s)          Comments: Assist for IADLs last week 2/2 generalized weakness     Hand Dominance   Dominant Hand: Right    Extremity/Trunk Assessment   Upper Extremity Assessment Upper Extremity Assessment: Overall WFL for tasks assessed    Lower Extremity Assessment Lower Extremity Assessment: Overall WFL for tasks assessed       Communication   Communication: No difficulties  Cognition Arousal/Alertness: Awake/alert Behavior During Therapy: WFL for tasks assessed/performed Overall Cognitive Status: Within Functional Limits for tasks assessed                                 General Comments: Pt reports memory difficulties, not appreciated this date      General Comments General comments (skin integrity, edema, etc.): Reclined in bed following BSC t/f: SPO2 85% on 3L [place] - resolved to 93% on 3L [place]    Exercises Other Exercises Other Exercises: Pt educated re: OT role, PLB, energy conservation strategies, DME recs, d/c recs, home/routines modifications, pain management, breathing techniques Other Exercises: Toileting, self-drinking, bed mobility, sup<>sit, sit<>stand, SPT, hand washing standing sink side   Assessment/Plan    PT Assessment Patient needs continued PT services  PT Problem List         PT Treatment Interventions      PT Goals (Current goals can be found in the Care Plan section)  Acute Rehab PT Goals Patient Stated Goal:  (to go home) PT Goal Formulation: Patient unable to participate in goal setting Time For Goal Achievement: 07/27/19 Potential to Achieve Goals: Fair    Frequency Min 2X/week   Barriers to discharge        Co-evaluation               AM-PAC PT "6 Clicks" Mobility  Outcome Measure Help needed turning from your back to your side while in a flat bed without using bedrails?: None Help needed moving from lying on your back to sitting on the side of a flat bed without using bedrails?: None Help needed moving to and from a  bed to a chair (including a wheelchair)?: A Little Help needed standing up from a chair using your arms (e.g., wheelchair or bedside chair)?: A Little Help needed to walk in hospital room?: A Little Help needed climbing 3-5 steps with a railing? : A Little 6 Click Score: 20    End of Session Equipment Utilized During Treatment: Gait belt Activity Tolerance: Patient limited by fatigue;Patient limited by lethargy Patient left: in bed;with bed alarm set Nurse Communication: Mobility status PT Visit Diagnosis: Unsteadiness on feet (R26.81);Muscle weakness (generalized) (M62.81);Difficulty in walking, not elsewhere classified (R26.2)    Time: 9604-5409 PT Time Calculation (min) (ACUTE ONLY): 20 min   Charges:   PT Evaluation $PT Eval Low Complexity: 1 Low PT Treatments $Gait Training: 8-22 mins          Alanson Puls, PT DPT 07/13/2019, 3:45 PM

## 2019-07-13 NOTE — Plan of Care (Signed)
Patient states ready for discharge and family will bring oxygen tank for transport. Case worker states home health is unavailable for patient currently.  Notified patient who states she is okay without the shower chair and would rather home health start later anyway.  States family will be coming in for couple weeks to help with care along with room mate.

## 2019-07-13 NOTE — Evaluation (Signed)
Occupational Therapy Evaluation Patient Details Name: Alicia Gibson MRN: [id number] DOB: 13-Jun-1960 Today's Date: 07/13/2019    History of Present Illness Alicia Gibson is 59 y.o. female with PMH of HTN, HLD, depression, anxiety, and COPD admitted on 07/10/2019 with COPD exacerbation and pneumonia.  Patient recently hospitalized 5/29-6/1 for respiratory failure. Now on 3L O2 at home.    Clinical Impression   Alicia Gibson was seen for OT evaluation this date. Pt was MOD I in all ADL and functional mobility, living in a single story home with her roommate who works during day - pt states sister is coming to town to assist upon d/c. Pt on 3 liters of O2 at home. Pt reports becoming easily fatigued or out of breath with minimal exertion. Pt currently requires SBA toileting at [facility] and hand washing standing sink side - pt benefitted from VCs for PLB, rest breaks, and diaphragmic breathing. Following BSC t/f SpO2 desat 85% on 3L [place] - resolved to 93% on 3L [place] c return to bed, PLB, and rest break. Anticipate MIN A for LBD. Pt presents to acute OT demonstrating impaired ADL performance and functional mobility 2/2 decreased activity tolerance, functional balance/strength deficits, and decreased LB access. Pt educated in energy conservation strategies including pursed lip breathing, activity pacing, home/routines modifications, work simplification, AE/DME, prioritizing of meaningful occupations, and falls prevention. Handout provided. Pt verbalized understanding and would benefit from additional skilled OT services to maximize recall and carryover of learned techniques and facilitate implementation of learned techniques into daily routines. Upon discharge, recommend [place] services.       Follow Up Recommendations  Home health OT    Equipment Recommendations  Tub/shower seat (small bathtub - rec small shower stool /seat)    Recommendations for Other Services       Precautions / Restrictions  Restrictions Weight Bearing Restrictions: No      Mobility Bed Mobility Overal bed mobility: Modified Independent Bed Mobility: Supine to Sit;Sit to Supine     Supine to sit: Modified independent (Device/Increase time);HOB elevated Sit to supine: Modified independent (Device/Increase time);HOB elevated      Transfers Overall transfer level: Needs assistance Equipment used: None Transfers: Sit to/from [facility] Sit to Stand: Supervision Stand pivot transfers: Min guard       General transfer comment: SBA + 1HHA SPT bed<>BSC    Balance Overall balance assessment: Needs assistance Sitting-balance support: No upper extremity supported;Feet supported Sitting balance-Leahy Scale: Good     Standing balance support: Single extremity supported;During functional activity Standing balance-Leahy Scale: Fair                             ADL either performed or assessed with clinical judgement   ADL Overall ADL's : Needs assistance/impaired                                       General ADL Comments: SBA toileting at [facility] and hand washing standing sink side - pt benefitted from VCs for PLB, rest breaks, and diaphragmic breathing. Anticipate MIN A for LBD.      Vision Baseline Vision/History:  (L eye deficits r/t prior fall injuring eye, eye patch PRN) Patient Visual Report: No change from baseline       Perception     Praxis      Pertinent Vitals/Pain Pain Assessment: 0-10 Pain Score: 7  Pain Location: chest Pain Descriptors / Indicators: Aching;Discomfort Pain Intervention(s): Limited activity within patient's tolerance;Repositioned     Hand Dominance Right   Extremity/Trunk Assessment Upper Extremity Assessment Upper Extremity Assessment: Overall WFL for tasks assessed   Lower Extremity Assessment Lower Extremity Assessment: Generalized weakness       Communication Communication Communication: No difficulties    Cognition Arousal/Alertness: Awake/alert Behavior During Therapy: WFL for tasks assessed/performed Overall Cognitive Status: Within Functional Limits for tasks assessed                                 General Comments: Pt reports memory difficulties, not appreciated this date   General Comments  Reclined in bed following BSC t/f: SPO2 85% on 3L [place] - resolved to 93% on 3L [place]    Exercises Exercises: Other exercises Other Exercises Other Exercises: Pt educated re: OT role, PLB, energy conservation strategies, DME recs, d/c recs, home/routines modifications, pain management, breathing techniques Other Exercises: Toileting, self-drinking, bed mobility, sup<>sit, sit<>stand, SPT, hand washing standing sink side   Shoulder Instructions      Home Living Family/patient expects to be discharged to:: Private residence Living Arrangements: Other relatives;Non-relatives/Friends (roommate, sister trying to come stay at d/c ) Available Help at Discharge: Friend(s);Available PRN/intermittently Type of Home: House Home Access: Ramped entrance;Stairs to enter Entrance Stairs-Number of Steps: 4   Home Layout: One level     Bathroom Shower/Tub: Tub/shower unit (small, too small for BSC in tub )   Bathroom Toilet: Standard     Home Equipment: Grab bars - tub/shower          Prior Functioning/Environment Level of Independence: Independent with assistive device(s)        Comments: Assist for IADLs last week 2/2 generalized weakness        OT Problem List: Decreased strength;Impaired balance (sitting and/or standing);Decreased activity tolerance;Decreased range of motion;Cardiopulmonary status limiting activity      OT Treatment/Interventions: Self-care/ADL training;Therapeutic exercise;Energy conservation;DME and/or AE instruction;Therapeutic activities;Visual/perceptual remediation/compensation;Patient/family education;Balance training    OT Goals(Current goals can be  found in the care plan section) Acute Rehab OT Goals Patient Stated Goal: to go home OT Goal Formulation: With patient Time For Goal Achievement: 07/27/19 Potential to Achieve Goals: Good ADL Goals Pt Will Perform Lower Body Dressing: with supervision;sit to/from stand Pt Will Transfer to Toilet: ambulating;regular height toilet;with supervision (c LRAD PRN) Additional ADL Goal #1: Pt will Independnetly verbalize plan to implement x3 energy conservation strategies  OT Frequency: Min 1X/week   Barriers to D/C: Inaccessible home environment;Decreased caregiver support          Co-evaluation              AM-PAC OT "6 Clicks" Daily Activity     Outcome Measure Help from another person eating meals?: None Help from another person taking care of personal grooming?: None Help from another person toileting, which includes using toliet, bedpan, or urinal?: A Little Help from another person bathing (including washing, rinsing, drying)?: A Little Help from another person to put on and taking off regular upper body clothing?: None Help from another person to put on and taking off regular lower body clothing?: A Little 6 Click Score: 21   End of Session Equipment Utilized During Treatment: Oxygen (3L [place])  Activity Tolerance: Patient tolerated treatment well Patient left: in bed;with call bell/phone within reach  OT Visit Diagnosis: Other abnormalities of gait and mobility (R26.89)  Time: 2979-8921 OT Time Calculation (min): 17 min Charges:  OT General Charges $OT Visit: 1 Visit OT Evaluation $OT Eval Low Complexity: 1 Low OT Treatments $Self Care/Home Management : 8-22 mins  Kathie Dike, M.S. OTR/L  07/13/19, 3:01 PM

## 2019-07-13 NOTE — Progress Notes (Signed)
Patient discharged home with room mate.  No needs or issues verbalized.  Aware home health unable to be set up currently. Home oxygen brought and demonstrated use.  States vendor only gave them small tanks this trip.  Education given about oxygen only lasting about 1 hour and states home is 15 minutes away.

## 2019-07-13 NOTE — Discharge Summary (Signed)
Physician Discharge Summary   Patient ID: Alicia Gibson MRN: [id number] DOB/AGE: 1960-10-21 59 y.o.  Admit date: 07/10/2019 Discharge date: 07/13/2019  Primary Care Physician:  [name], FNP   Recommendations for Outpatient Follow-up:  1. Follow up with PCP in 1-2 weeks 2. Palliative care to see outpatient 3. Placed on Roxanol 5 mg every 4 hours as needed for shortness of breath or severe pain (per palliative care recommendations) 4. Continue Augmentin 1 tab p.o. twice daily for 10 days, prednisone taper  Home Health: Home health PT OT, RN, home health aide Equipment/Devices: Shower chair  Discharge Condition: stable CODE STATUS: FULL  Diet recommendation: Heart healthy diet  Discharge Diagnoses:    . Acute on chronic respiratory failure with hypoxia (HCC)\ . 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 Aspiration pneumonia . Essential hypertension . Acute COPD exacerbation (HCC) . Anxiety and depression . Loculated effusion . Essential hypertension   Consults: Pulmonology Palliative medicine    Allergies:   Allergies  Allergen Reactions  . Contrast Allergy Premed Pack [Prednisone & Diphenhydramine] Anaphylaxis    Patient states she had iv contrast in 2008 which caused her throat to close and difficulty breathing.   . Iodine Anaphylaxis  . Codeine Hives and Nausea And Vomiting  . Shellfish Allergy Hives  . Penicillins Hives and Rash     DISCHARGE MEDICATIONS: Allergies as of 07/13/2019      Reactions   Contrast Allergy Premed Pack [prednisone & Diphenhydramine] Anaphylaxis   Patient states she had iv contrast in 2008 which caused her throat to close and difficulty breathing.    Iodine Anaphylaxis   Codeine Hives, Nausea And Vomiting   Shellfish Allergy Hives   Penicillins Hives, Rash      Medication List    STOP taking these medications   oxyCODONE 5 MG immediate release tablet Commonly known as: Oxy IR/ROXICODONE      TAKE these medications   albuterol (2.5 MG/3ML) 0.083% nebulizer solution Commonly known as: PROVENTIL Take 3 mLs (2.5 mg total) by nebulization 4 (four) times daily. What changed: Another medication with the same name was changed. Make sure you understand how and when to take each.   albuterol 108 (90 Base) MCG/ACT inhaler Commonly known as: VENTOLIN HFA INHALE 2 PUFFS INTO THE LUNGS EVERY 6 HOURS AS NEEDED FOR WHEEZING OR SHORTNESS OF BREATH What changed: See the new instructions.   amLODipine 10 MG tablet Commonly known as: NORVASC Take 1 tablet (10 mg total) by mouth daily.   amoxicillin-clavulanate 875-125 MG tablet Commonly known as: Augmentin Take 1 tablet by mouth 2 (two) times daily for 10 days.   aspirin EC 81 MG tablet Take 81 mg by mouth daily.   atorvastatin 40 MG tablet Commonly known as: LIPITOR TAKE 1 TABLET(40 MG) BY MOUTH DAILY   budesonide 0.5 MG/2ML nebulizer solution Commonly known as: PULMICORT Take 2 mLs (0.5 mg total) by nebulization 2 (two) times daily.   cetirizine 10 MG tablet Commonly known as: ZYRTEC Take 1 tablet (10 mg total) by mouth daily.   cyclobenzaprine 10 MG tablet Commonly known as: FLEXERIL TAKE 1 TABLET BY MOUTH 2 TIMES DAILY AS NEEDED FOR MUSCLE SPASMS What changed:   how much to take  how to take this  when to take this  reasons to take this  additional instructions   hydrOXYzine 25 MG tablet Commonly known as: ATARAX/VISTARIL Take 1 tablet (25 mg total) by mouth 3 (three) times daily as needed for anxiety.  morphine CONCENTRATE 10 MG/0.5ML Soln concentrated solution Take 0.25 mLs (5 mg total) by mouth every 4 (four) hours as needed for severe pain or shortness of breath.   Mucinex 600 MG 12 hr tablet Generic drug: guaiFENesin Take 2 tablets (1,200 mg total) by mouth 2 (two) times daily as needed for cough or to loosen phlegm.   multivitamin with minerals Tabs tablet Take 1 tablet by mouth daily.   nicotine  21 mg/24hr patch Commonly known as: NICODERM CQ - dosed in mg/24 hours Place 1 patch (21 mg total) onto the skin daily.   omeprazole 20 MG tablet Commonly known as: PRILOSEC OTC Take 1 tablet (20 mg total) by mouth daily.   Perforomist 20 MCG/2ML nebulizer solution Generic drug: formoterol Take 2 mLs (20 mcg total) by nebulization 2 (two) times daily.   predniSONE 10 MG tablet Commonly known as: DELTASONE Prednisone dosing: Take  Prednisone  (4 tabs) x 3 days, then taper to  (3 tabs) x 3 days, then  (2 tabs) x 3days, then  (1 tab) x 3days, then OFF. What changed: See the new instructions.   sertraline 100 MG tablet Commonly known as: ZOLOFT Take 1 tablet (100 mg total) by mouth daily.        Brief H and P: For complete details please refer to admission H and P, but in brief  Admit date:07/10/2019 59 y.o.femalewith historyofhypertension, hyperlipidemia, depression/anxiety,COPD on nasal cannula O2 who followsDr Belia Heman, was [facility]- 06/25/19 for acute on chronic hypoxic respiratory failure due to COPD exacerbation as well as spontaneous left pneumothorax requiring chest tube placement and subsequently discharged home on 3 L nasal cannula, presented to pulmonary clinic today for post discharge follow-up. She reported persistent dyspnea to pulmonologist Dr. Jayme Cloud and was noted to be significantly dyspneic as well as hypoxic with O2 sat 66% on room air (due to O2 tank being empty) and only improved to 80% on 8 L O2.She had to be placed on nonrebreather mask at 15 L O2in the pulmonary clinicand transported to the ED for further evaluation/admission for COPD exacerbation. Chest x-ray on 6/15 had shown severe hyperinflation and loculated fluid in the left fissure as well as small left pleural effusion.Patient states she was advised 3.5 to 4 L continuous O2 upon last discharge, however has felt progressive dyspnea with reduced  exercise tolerance since then. ED Course: Afebrile, labs okay.Repeat chest x-ray revealed Small left pleural effusion with increased interstitial opacity within the left lung base, which may represent edema or infiltrate.Two large smoothly marginated masslike opacities within the left lung, favored to reflect loculated fluid within the major fissure,similar in appearance to previous study. Hospital course: Patient admitted to [facility] for further evaluation and management.  Hospital Course:   Acute on chronic respiratory failure with hypoxia -Multifactorial, patient apparently ran out of O2 in the tank while at the pulmonary clinic, suboptimal recovery from recent hospitalization and pleural effusions, pneumonia noted on the chest imaging -Currently back to baseline on 3 L O2 via nasal cannula -Hemoptysis could be related to bronchitis, coughing.  Currently feel much better, at her baseline. -Cannot get contrasted CT due to patient's history of contrast allergy.  Patient was seen by pulmonology, underwent CT without contrast with findings suggestive of loculated pleural effusion, possible left lower lobe infiltrate and lung nodule.  Follow-up CT in 3 months recommended -Patient was placed on IV steroids, IV Unasyn per pulmonary recommendations -Palliative medicine was consulted due to history of  end-stage COPD -Transition to oral Augmentin, prednisone taper, continue nebs, O2, started on Roxanol as needed for shortness of breath, severe pain  End-stage COPD, pulmonary emphysema -Patient was seen by pulmonology and palliative medicine -Goals of care completed, patient " never want trach", recommended to Roxanol 5 mg every 4 hours as needed for shortness of breath, pain -Continue nebs, O2, prednisone taper, Augmentin -Outpatient follow-up with pulmonology  Hypertension -Continue amlodipine   Hyperlipidemia Continue Lipitor  Anxiety/depression Continue sertraline    Day of Discharge S: No  acute complaints, feeling close to her baseline, on O2 3 L  BP (!) 146/90 (BP Location: Right Arm)   Pulse 94   Temp 98.1 F (36.7 C)   Resp (!) 22   Ht 5\' 6"  (1.676 m)   Wt 64 kg   SpO2 97%   BMI 22.77 kg/m   Physical Exam: General: Alert and awake oriented x3 not in any acute distress. HEENT: anicteric sclera, pupils reactive to light and accommodation CVS: S1-S2 clear no murmur rubs or gallops Chest: Diminished breath sounds throughout, no active wheezing Abdomen: soft nontender, nondistended, normal bowel sounds Extremities: no cyanosis, clubbing or edema noted bilaterally Neuro: Cranial nerves II-XII intact, no focal neurological deficits    Get Medicines reviewed and adjusted: Please take all your medications with you for your next visit with your Primary MD  Please request your Primary MD to go over all hospital tests and procedure/radiological results at the follow up. Please ask your Primary MD to get all Hospital records sent to his/her office.  If you experience worsening of your admission symptoms, develop shortness of breath, life threatening emergency, suicidal or homicidal thoughts you must seek medical attention immediately by calling 911 or calling your MD immediately  if symptoms less severe.  You must read complete instructions/literature along with all the possible adverse reactions/side effects for all the Medicines you take and that have been prescribed to you. Take any new Medicines after you have completely understood and accept all the possible adverse reactions/side effects.   Do not drive when taking pain medications.   Do not take more than prescribed Pain, Sleep and Anxiety Medications  Special Instructions: If you have smoked or chewed Tobacco  in the last 2 yrs please stop smoking, stop any regular Alcohol  and or any Recreational drug use.  Wear Seat belts while driving.  Please note  You were cared for by a hospitalist during your hospital  stay. Once you are discharged, your primary care physician will handle any further medical issues. Please note that NO REFILLS for any discharge medications will be authorized once you are discharged, as it is imperative that you return to your primary care physician (or establish a relationship with a primary care physician if you do not have one) for your aftercare needs so that they can reassess your need for medications and monitor your lab values.   The results of significant diagnostics from this hospitalization (including imaging, microbiology, ancillary and laboratory) are listed below for reference.      Procedures/Studies:  DG Chest 2 View  Result Date: 07/10/2019 CLINICAL DATA:  Sepsis EXAM: CHEST - 2 VIEW COMPARISON:  07/09/2019 FINDINGS: Heart size is normal. Atherosclerotic calcification of the aortic knob. Again seen are 2 large smoothly marginated masslike opacities within the left lung favored to reflect loculated fluid within the major fissure. Small left pleural effusion. Increased interstitial opacity within the left lung base. Hyperexpanded lungs. No pneumothorax. IMPRESSION: 1. Small left  pleural effusion with increased interstitial opacity within the left lung base, which may represent edema or infiltrate. 2. Two large smoothly marginated masslike opacities within the left lung, favored to reflect loculated fluid within the major fissure. These are similar in appearance to previous study. Electronically Signed   By: Davina Poke D.O.   On: 07/10/2019 12:27   DG Chest 2 View  Result Date: 07/09/2019 CLINICAL DATA:  59 year old female with history of left pneumothorax. EXAM: CHEST - 2 VIEW COMPARISON:  Chest x-ray 06/25/2019. FINDINGS: In the left hemithorax there are 2 mass-like opacities which are favored to reflect loculated fluid in association with the major fissure. Previously noted left pleural effusion has decreased in size, now likely partially loculated in the major  fissure and layering in the lower left hemithorax. Extensive interstitial prominence in the left mid to lower lung. Linear scarring in the right middle lobe. No pneumothorax. No acute consolidative airspace disease. No evidence of pulmonary edema. Heart size is normal. Upper mediastinal contours are within normal limits. Aortic atherosclerosis. IMPRESSION: 1. No pneumothorax. 2. Interval development of mass-like opacities in the left lung which are strongly favored to represent areas of loculated pleural fluid associated with the left major fissure. There is also a small amount of layering left pleural effusion. These findings could be definitively evaluated with chest CT if clinically appropriate. 3. Interstitial prominence throughout the left mid to lower lung concerning for fibrosis. This is asymmetric, and presumably post infectious or inflammatory in etiology. This could be further evaluated with high-resolution chest CT if clinically appropriate. 4. Aortic atherosclerosis. Electronically Signed   By: Vinnie Langton M.D.   On: 07/09/2019 15:52   DG Chest 2 View  Result Date: 06/22/2019 CLINICAL DATA:  Shortness of breath. EXAM: CHEST - 2 VIEW COMPARISON:  Radiograph 12/06/2017 FINDINGS: Large left pneumothorax with near complete collapse of the left lung. No evidence of tension or mediastinal shift. The left lung is atelectatic. Hyperinflated right lung with emphysema. No pleural fluid or focal airspace disease. Heart is normal in size. No acute osseous abnormalities are seen. IMPRESSION: Large left pneumothorax with near complete collapse of the left lung. No evidence of tension. Critical Value/emergent results were called by telephone at the time of interpretation on 06/22/2019 at 12:33 am to Dr Luvenia Starch SUNG , who verbally acknowledged these results. Electronically Signed   By: Keith Rake M.D.   On: 06/22/2019 00:33   CT CHEST WO CONTRAST  Result Date: 07/11/2019 CLINICAL DATA:  COPD.  Shortness  of breath EXAM: CT CHEST WITHOUT CONTRAST TECHNIQUE: Multidetector CT imaging of the chest was performed following the standard protocol without IV contrast. COMPARISON:  Multiple recent chest radiographs FINDINGS: Cardiovascular: No cardiomegaly or pericardial effusion. Atherosclerotic calcification Mediastinum/Nodes: Negative for adenopathy or mass. Lungs/Pleura: Patchy airspace disease in the dependent left lung. There are 2 ovoid opacities along the left major fissure which are new from May 2021 chest radiographs. The largest is more cranial and measures 6 x 3 cm and the more inferior is 5 x 3 cm. These have a somewhat lucent center and the uppermost has internal gas. There is a small dependent left pleural effusion which may have internal gas based on reformats. The right lung is clear. Hyperinflation and emphysema. Spiculated nodule in the left upper lobe measuring 12 x 8 mm on coronal reformats. Upper Abdomen: Negative for acute finding Musculoskeletal: No acute or aggressive finding IMPRESSION: 1. Two ovoid masses along the upper and lower left major fissure which  have developed since chest radiographs in may 2021, most consistent with loculated pleural effusions. There is also dependent pleural fluid with possible superimposed gas. 2. Asymmetric reticulation in the left lower lobe, presumably pneumonia. 3. 12 x 8 mm left upper lobe nodule. Consider one of the following in 3 months for both low-risk and high-risk individuals: (a) repeat chest CT, (b) follow-up PET-CT, or (c) tissue sampling. This recommendation follows the consensus statement: Guidelines for Management of Incidental Pulmonary Nodules Detected on CT Images: From the Fleischner Society 2017; Radiology 2017; 284:228-243. 4. Advanced emphysema. Electronically Signed   By: Marnee Spring M.D.   On: 07/11/2019 11:21   DG CHEST PORT 1 VIEW  Result Date: 06/25/2019 CLINICAL DATA:  Pneumothorax EXAM: PORTABLE CHEST 1 VIEW COMPARISON:  None.  FINDINGS: The heart size and mediastinal contours are within normal limits. Aortic knob calcifications are seen. No definite residual pneumothorax is noted. There is diffusely increased pulmonary vasculature throughout both lungs. A small left pleural effusion is present. IMPRESSION: No definite pneumothorax. Pulmonary vascular congestion and small left pleural effusion. Electronically Signed   By: Jonna Clark M.D.   On: 06/25/2019 05:47   DG Chest Port 1 View  Result Date: 06/24/2019 CLINICAL DATA:  Chest tube removal. EXAM: PORTABLE CHEST 1 VIEW COMPARISON:  Radiograph earlier this day. FINDINGS: Removal of left pigtail catheter. Slight lucency paralleling the left heart border may represent anterior pneumothorax, no pleural line is seen. Stable left basilar pleuroparenchymal opacity. Chronic hyperinflation and bronchial thickening unchanged. No new airspace disease. Stable heart size and mediastinal contours. IMPRESSION: 1. Removal of left pigtail catheter. Slight lucency paralleling the left heart border may represent small anterior pneumothorax or be artifact. No pleural line is seen. Recommend follow-up radiograph. 2. Unchanged left basilar pleuroparenchymal opacity. Electronically Signed   By: Narda Rutherford M.D.   On: 06/24/2019 18:10   DG Chest Port 1 View  Result Date: 06/24/2019 CLINICAL DATA:  Chest tube placed to water seal EXAM: PORTABLE CHEST 1 VIEW COMPARISON:  Jun 24, 2019 FINDINGS: Chest tube position unchanged. No pneumothorax. There is a left pleural effusion with left base atelectasis. There is mild atelectatic change in the right base. No new opacity evident. Heart size and pulmonary vascularity are within normal limits. No evident adenopathy. No bone lesions. IMPRESSION: No change in chest tube position. No pneumothorax. Stable left pleural effusion. Bibasilar atelectasis. No new opacity. Stable cardiac silhouette. Electronically Signed   By: Bretta Bang III M.D.   On:  06/24/2019 13:08   DG Chest Port 1 View  Result Date: 06/24/2019 CLINICAL DATA:  Recent pneumothorax with chest tube placement EXAM: PORTABLE CHEST 1 VIEW COMPARISON:  Jun 23, 2019 FINDINGS: Chest tube present peripherally in the left lateral hemithorax, stable. No pneumothorax evident. There is a small left pleural effusion with left base atelectasis. There is mild atelectatic change in the right base. Heart size and pulmonary vascularity are within normal limits. No adenopathy. No bone lesions. IMPRESSION: No appreciable pneumothorax. Small left pleural effusion with left base atelectasis. There is also right base atelectasis. Stable cardiac silhouette. Electronically Signed   By: Bretta Bang III M.D.   On: 06/24/2019 07:53   DG Chest Port 1 View  Result Date: 06/23/2019 CLINICAL DATA:  Left pneumothorax with chest tube in place.  COPD. EXAM: PORTABLE CHEST 1 VIEW COMPARISON:  Chest radiograph from one day prior. FINDINGS: Stable peripheral left chest tube. Stable cardiomediastinal silhouette with normal heart size. No pneumothorax. No significant pleural effusions.  Hyperinflated lungs. Emphysema. No pulmonary edema. Stable patchy left lung base opacity. IMPRESSION: 1. No pneumothorax. Stable peripheral left chest tube position. 2. Hyperinflated lungs and emphysema, compatible with COPD. 3. Stable patchy left lung base opacity, favor atelectasis. Electronically Signed   By: Delbert PhenixJason A Poff M.D.   On: 06/23/2019 07:17   DG Chest Port 1 View  Result Date: 06/22/2019 CLINICAL DATA:  Follow-up left pneumothorax EXAM: PORTABLE CHEST 1 VIEW COMPARISON:  06/22/2019 FINDINGS: Left chest tube is again noted although has withdrawn somewhat in the interval with only half of the sideholes within the chest cavity. No recurrent pneumothorax on the left is noted. Diffuse interstitial changes are again noted bilaterally consistent with chronic scarring. No new focal abnormality is seen. Aortic calcifications are  noted. Cardiac shadow is stable. IMPRESSION: Slight withdrawal of left chest tube with approximately half of the sideholes extrinsic to the ribcage. No recurrent pneumothorax is noted. Electronically Signed   By: Alcide CleverMark  Lukens M.D.   On: 06/22/2019 13:08   DG Chest Portable 1 View  Result Date: 06/22/2019 CLINICAL DATA:  Chest tube placement EXAM: PORTABLE CHEST 1 VIEW COMPARISON:  Jun 22, 2019 FINDINGS: The heart size and mediastinal contours are within normal limits. There is interval placement of a left-sided pigtail catheter with re-expansion of the left lung. No definite residual pneumothorax is seen. Bilateral coarsened interstitial markings are seen throughout both lungs with hyperinflation of the upper lung zones. No pleural effusion. IMPRESSION: Interval placement of left-sided pigtail catheter with no residual pneumothorax seen. Electronically Signed   By: Jonna ClarkBindu  Avutu M.D.   On: 06/22/2019 03:31       LAB RESULTS: Basic Metabolic Panel: Recent Labs  Lab 07/10/19 1135 07/11/19 0611  NA 139 139  K 4.4 3.9  CL 88* 91*  CO2 38* 36*  GLUCOSE 124* 158*  BUN 14 15  CREATININE 0.59 0.55  CALCIUM 9.2 9.1   Liver Function Tests: Recent Labs  Lab 07/10/19 1135  AST 22  ALT 21  ALKPHOS 93  BILITOT 0.5  PROT 7.3  ALBUMIN 3.6   No results for input(s): LIPASE, AMYLASE in the last 168 hours. No results for input(s): AMMONIA in the last 168 hours. CBC: Recent Labs  Lab 07/10/19 1135  WBC 7.1  NEUTROABS 5.0  HGB 10.4*  HCT 34.0*  MCV 94.7  PLT 322   Cardiac Enzymes: No results for input(s): CKTOTAL, CKMB, CKMBINDEX, TROPONINI in the last 168 hours. BNP: Invalid input(s): POCBNP CBG: No results for input(s): GLUCAP in the last 168 hours.     Disposition and Follow-up: Discharge Instructions    Diet - low sodium heart healthy   Complete by: As directed    Increase activity slowly   Complete by: As directed        DISPOSITION: home with home  health   DISCHARGE FOLLOW-UP  Follow-up Information    Malfi, Jodelle GrossNicole M, FNP. Schedule an appointment as soon as possible for a visit in 2 week(s).   Specialty: Family Medicine Contact information: [address] [place] [address] [phone]        Erin FullingKasa, Kurian, MD. Schedule an appointment as soon as possible for a visit in 2 week(s).   Specialties: Pulmonary Disease, Cardiology Contact information: [address] [place] [address] [phone]                Time coordinating discharge:  35 minutes  Signed:   Thad Rangeripudeep Benedetto Ryder M.D. [facility] 07/13/2019, 11:28  AM        

## 2019-07-15 ENCOUNTER — Telehealth: Payer: Self-pay | Admitting: Family Medicine

## 2019-07-15 LAB — CULTURE, BLOOD (ROUTINE X 2)
Culture: NO GROWTH
Culture: NO GROWTH
Special Requests: ADEQUATE

## 2019-07-15 NOTE — Chronic Care Management (AMB) (Signed)
  Care Management   Note  07/15/2019 Name: Alicia Gibson MRN: [id number] DOB: Mar 27, 1960  Alicia Gibson is a 59 y.o. year old female who is a primary care patient of Anitra Lauth, Jodelle Gross, FNP. I reached out to [facility] by phone today in response to a referral sent by Ms. Hareem Klausing's health plan.    Ms. Gundry was given information about care management services today including:  1. Care management services include personalized support from designated clinical staff supervised by her physician, including individualized plan of care and coordination with other care providers 2. 24/7 contact phone numbers for assistance for urgent and routine care needs. 3. The patient may stop care management services at any time by phone call to the office staff.  Patient agreed to services and verbal consent obtained.   Follow up plan: Telephone appointment with care management team member scheduled for:08/12/2019  Elisha Ponder, LPN Health Advisor, Embedded Care Coordination [facility] Health Care Management ??[email or web address] ??[phone]

## 2019-07-22 ENCOUNTER — Other Ambulatory Visit: Payer: Medicaid Other | Admitting: Primary Care

## 2019-07-22 ENCOUNTER — Other Ambulatory Visit: Payer: Self-pay

## 2019-07-22 DIAGNOSIS — J4489 Other specified chronic obstructive pulmonary disease: Secondary | ICD-10-CM

## 2019-07-22 DIAGNOSIS — Z515 Encounter for palliative care: Secondary | ICD-10-CM

## 2019-07-22 DIAGNOSIS — J449 Chronic obstructive pulmonary disease, unspecified: Secondary | ICD-10-CM

## 2019-07-22 DIAGNOSIS — J441 Chronic obstructive pulmonary disease with (acute) exacerbation: Secondary | ICD-10-CM

## 2019-07-22 NOTE — Progress Notes (Signed)
[facility] Consult Note Telephone: [phone]  Fax: [phone]  PATIENT NAME: Alicia Gibson [address] Alicia Gibson [address] [phone] (home)  DOB: 1960-06-10 MRN: [id number]  PRIMARY CARE PROVIDER:    Verl Gibson, [place],  [place] [address] [phone]  REFERRING PROVIDER:   Verl Gibson, [address] [place],  [place] [address] [phone]  RESPONSIBLE PARTY:   Extended Emergency Contact Information Primary Emergency Contact: Alicia Gibson Address: [address]          [place], [place] [address] Mobile Phone: [phone] Relation: Friend Secondary Emergency Contact: Alicia Gibson Mobile Phone: [phone] Relation: Sister  I met with patient and family in home.  ASSESSMENT AND RECOMMENDATIONS:   1. Advance Care Planning/Goals of Care: Goals include to maximize quality of life and symptom management. Our advance care planning conversation included a discussion about:     The value and importance of advance care planning   Experiences with loved ones who have been seriously ill or have died   Exploration of goals of care in the event of a sudden injury or illness   Identification and preparation of a healthcare agent   Review and new creation of an  advance directive document . I gave Five Wishes journal and MOST form for her review. Pt states she needs to draw up other documents like a Last Will. She wants to look over MOST and consider the choices.    2. Symptom Management:    States has chronic COPD and bronchitis. States life long smoker, worked in [place], and in past few years has gotten much worse. Recent pna and pneumothorax, COPD exacerbations.  Constipation: Discussed miralax and senna, Takes dulcolax and occ. Nutritional supplement (Ensure) also taken. Recommend prevention bowel programming with daily miralax and senna.  Pain: Sore chest and thorax from recent  hospital stay.Currently on morphine 5 mg q 4 hours altho sometimes takes 4 instead of  6 doses daily. Recommended ATC tylenol although sister felt that this drug never left one's body and was not open to acetaminophen CR dosing.   Dyspnea: Taking morphine 5 mg q 4 h. I recommend  MS Contin 15 mg q 12 hours to allow for better symptom control and ease of dosing.  Discussed pursed lip breathing, which she knows. Discussed table top fan for trigeminal stimulation, shown to improve air hunger. Has tried cpap and did not like it. Oxygen from [place]ia Gibson. Recently ran out of oxygen on a MD visit. We discussed Inogen but does not think she can  tolerate the on demand/pulse. Takes 3 L/Alicia Gibson now. Needs water trap, which she will request from oxygen company.  ADLs: Needs a tub bench for bathing, please order from PCP office. She would like one without a back, and suitable for width of standard tub.   3. Family /Caregiver/Community Supports: [place]. Her mother died when she was young and she was raised by a sister who she calls Alicia Gibson. Sister is visiting today from [place] and frequently does to assist with care.   4. Cognitive / Functional decline: A and O x 3. Able to ambulate with furniture touch and DOE. Can bathe self, most adls. Needs assistance with many iadls.   5. Follow up Palliative Care Visit: Palliative care will continue to follow for goals of care clarification and symptom management. Return 6 weeks or prn.  I spent 75 minutes providing this consultation,  from 1515 to 1630. More than 50% of the time in this consultation was spent coordinating communication.   HISTORY OF PRESENT ILLNESS:  Alicia Gibson is a 59 y.o. year old female with multiple medical problems including COPD, DOE, SOB, debility, depression/anxiety. Palliative Care was asked to follow this patient by consultation request of Alicia Gibson to help address advance care planning and goals of care.  This is an initial visit.  CODE STATUS: TBD, MOST given, DNR discussed.  PPS: 50%  HOSPICE ELIGIBILITY/DIAGNOSIS: tbd  PAST MEDICAL HISTORY:  Past Medical History:  Diagnosis Date   Allergy    Anxiety    Colitis    recurrent   COPD (chronic obstructive pulmonary disease) ([facility])    Depression    Graves disease    s/p iodine radiation treatment - euthyroid for many years per pt   Hyperlipidemia    Hypertension    Loss of eye, LEFT 2007    SOCIAL HX:  Social History   Tobacco Use   Smoking status: Former Smoker    Packs/day: 0.10    Types: Cigarettes   Smokeless tobacco: Never Used  Substance Use Topics   Alcohol use: Never    ALLERGIES:  Allergies  Allergen Reactions   Contrast Allergy Premed Pack [Prednisone & Diphenhydramine] Anaphylaxis    Patient states she had iv contrast in 2008 which caused her throat to close and difficulty breathing.    Iodine Anaphylaxis   Codeine Hives and Nausea And Vomiting   Shellfish Allergy Hives   Penicillins Hives and Rash     PERTINENT MEDICATIONS:  Outpatient Encounter Medications as of 07/22/2019  Medication Sig   albuterol (PROVENTIL) (2.5 MG/3ML) 0.083% nebulizer solution Take 3 mLs (2.5 mg total) by nebulization 4 (four) times daily.   albuterol (VENTOLIN HFA) 108 (90 Base) MCG/ACT inhaler INHALE 2 PUFFS INTO THE LUNGS EVERY 6 HOURS AS NEEDED FOR WHEEZING OR SHORTNESS OF BREATH (Patient taking differently: Inhale 2 puffs into the lungs every 6 (six) hours as needed for wheezing or shortness of breath. )   amLODipine (NORVASC) 10 MG tablet Take 1 tablet (10 mg total) by mouth daily.   amoxicillin-clavulanate (AUGMENTIN) 875-125 MG tablet Take 1 tablet by mouth 2 (two) times daily for 10 days.   aspirin EC 81 MG tablet Take 81 mg by mouth daily.   atorvastatin (LIPITOR) 40 MG tablet TAKE 1 TABLET(40 MG) BY MOUTH DAILY   budesonide (PULMICORT) 0.5 MG/2ML nebulizer solution Take 2 mLs (0.5 mg total) by  nebulization 2 (two) times daily.   cetirizine (ZYRTEC) 10 MG tablet Take 1 tablet (10 mg total) by mouth daily.   cyclobenzaprine (FLEXERIL) 10 MG tablet TAKE 1 TABLET BY MOUTH 2 TIMES DAILY AS NEEDED FOR MUSCLE SPASMS (Patient taking differently: Take 10 mg by mouth 2 (two) times daily as needed for muscle spasms. )   formoterol (PERFOROMIST) 20 MCG/2ML nebulizer solution Take 2 mLs (20 mcg total) by nebulization 2 (two) times daily.   guaiFENesin (MUCINEX) 600 MG 12 hr tablet Take 2 tablets (1,200 mg total) by mouth 2 (two) times daily as needed for cough or to loosen phlegm.   hydrOXYzine (ATARAX/VISTARIL) 25 MG tablet Take 1 tablet (25 mg total) by mouth 3 (three) times daily as needed for anxiety.   Morphine Sulfate (MORPHINE CONCENTRATE) 10 MG/0.5ML SOLN concentrated solution Take 0.25 mLs (5 mg total) by mouth every 4 (four) hours as needed for severe pain or shortness of breath.   Multiple Vitamin (MULTIVITAMIN  WITH MINERALS) TABS tablet Take 1 tablet by mouth daily.   nicotine (NICODERM CQ - DOSED IN MG/24 HOURS) 21 mg/24hr patch Place 1 patch (21 mg total) onto the skin daily.   omeprazole (PRILOSEC OTC) 20 MG tablet Take 1 tablet (20 mg total) by mouth daily.   predniSONE (DELTASONE) 10 MG tablet Prednisone dosing: Take  Prednisone 77m (4 tabs) x 3 days, then taper to 368m(3 tabs) x 3 days, then 2071m2 tabs) x 3days, then 33m15m tab) x 3days, then OFF.   sertraline (ZOLOFT) 100 MG tablet Take 1 tablet (100 mg total) by mouth daily.   No facility-administered encounter medications on file as of 07/22/2019.    PHYSICAL EXAM / ROS:   Current and past weights: Has gained some and now weighs 140, baseline was 120's.  General: NAD, frail appearing, thin Cardiovascular: no chest pain reported, no edema  Pulmonary: no cough, no increased SOB, oxygen dependent 3 L Abdomen: appetite good, due to prednisone, endorses constipation, continent of bowel GU: denies dysuria, continent  of urine MSK:  no joint and ROM abnormalities, ambulatory with prn device.  Skin: no rashes or wounds reported Neurological: Weakness, pain in thorax likely from recent chest tube  KathJason Coop ACHPFranklin Surgical Center LLCVID-19 PATIENT SCREENING TOOL  Person answering questions: ____________Staff_______ _____   1.  Is the patient or any family member in the home showing any signs or symptoms regarding respiratory infection?               Person with Symptom- __________NA_________________  a. Fever                                                                          Yes___ No___          ___________________  b. Shortness of breath                                                    Yes___ No___          ___________________ c. Cough/congestion                                       Yes___  No___         ___________________ d. Body aches/pains                                                         Yes___ No___        ____________________ e. Gastrointestinal symptoms (diarrhea, nausea)           Yes___ No___        ____________________  2. Within the past 14 days, has anyone living in the home had any contact with someone with or under investigation for COVID-19?    Yes___ No_X_  Person __________________

## 2019-07-23 ENCOUNTER — Other Ambulatory Visit: Payer: Self-pay | Admitting: Family Medicine

## 2019-07-23 DIAGNOSIS — R0602 Shortness of breath: Secondary | ICD-10-CM

## 2019-07-23 DIAGNOSIS — I10 Essential (primary) hypertension: Secondary | ICD-10-CM

## 2019-07-23 DIAGNOSIS — J9611 Chronic respiratory failure with hypoxia: Secondary | ICD-10-CM

## 2019-07-23 DIAGNOSIS — Z515 Encounter for palliative care: Secondary | ICD-10-CM

## 2019-07-31 ENCOUNTER — Other Ambulatory Visit: Payer: Self-pay | Admitting: Family Medicine

## 2019-07-31 NOTE — Telephone Encounter (Signed)
Morphine Sulfate (MORPHINE CONCENTRATE) 10 MG/0.5ML SOLN concentrated solution [[id number]]    pt has accidentally knocked over medication and is now requesting the pill form instead. Pt states medication that was spilled what is left will not last the 30 days      [id number] [place]ree Ditto, [place] - [address] AT [facility] OF [address]  [address] [place] [address]  Phone: [phone] Fax: [phone]

## 2019-08-01 ENCOUNTER — Other Ambulatory Visit: Payer: Self-pay

## 2019-08-01 ENCOUNTER — Inpatient Hospital Stay
Admission: EM | Admit: 2019-08-01 | Discharge: 2019-08-04 | DRG: 193 | Disposition: A | Discharge status: Home / Self Care | Payer: Medicaid Other | Attending: Internal Medicine | Admitting: Internal Medicine

## 2019-08-01 ENCOUNTER — Encounter: Payer: Self-pay | Admitting: *Deleted

## 2019-08-01 ENCOUNTER — Emergency Department: Payer: Medicaid Other

## 2019-08-01 DIAGNOSIS — J9383 Other pneumothorax: Secondary | ICD-10-CM | POA: Diagnosis present

## 2019-08-01 DIAGNOSIS — D649 Anemia, unspecified: Secondary | ICD-10-CM | POA: Diagnosis present

## 2019-08-01 DIAGNOSIS — F419 Anxiety disorder, unspecified: Secondary | ICD-10-CM | POA: Diagnosis not present

## 2019-08-01 DIAGNOSIS — F329 Major depressive disorder, single episode, unspecified: Secondary | ICD-10-CM | POA: Diagnosis present

## 2019-08-01 DIAGNOSIS — Z87891 Personal history of nicotine dependence: Secondary | ICD-10-CM | POA: Diagnosis not present

## 2019-08-01 DIAGNOSIS — Z91013 Allergy to seafood: Secondary | ICD-10-CM

## 2019-08-01 DIAGNOSIS — J9621 Acute and chronic respiratory failure with hypoxia: Secondary | ICD-10-CM | POA: Diagnosis present

## 2019-08-01 DIAGNOSIS — Z8709 Personal history of other diseases of the respiratory system: Secondary | ICD-10-CM

## 2019-08-01 DIAGNOSIS — Z20822 Contact with and (suspected) exposure to covid-19: Secondary | ICD-10-CM | POA: Diagnosis present

## 2019-08-01 DIAGNOSIS — J441 Chronic obstructive pulmonary disease with (acute) exacerbation: Secondary | ICD-10-CM | POA: Diagnosis not present

## 2019-08-01 DIAGNOSIS — J449 Chronic obstructive pulmonary disease, unspecified: Secondary | ICD-10-CM | POA: Diagnosis not present

## 2019-08-01 DIAGNOSIS — E785 Hyperlipidemia, unspecified: Secondary | ICD-10-CM | POA: Diagnosis not present

## 2019-08-01 DIAGNOSIS — Z66 Do not resuscitate: Secondary | ICD-10-CM | POA: Diagnosis not present

## 2019-08-01 DIAGNOSIS — J9622 Acute and chronic respiratory failure with hypercapnia: Secondary | ICD-10-CM | POA: Diagnosis present

## 2019-08-01 DIAGNOSIS — Z811 Family history of alcohol abuse and dependence: Secondary | ICD-10-CM | POA: Diagnosis not present

## 2019-08-01 DIAGNOSIS — Z885 Allergy status to narcotic agent status: Secondary | ICD-10-CM | POA: Diagnosis not present

## 2019-08-01 DIAGNOSIS — Z9981 Dependence on supplemental oxygen: Secondary | ICD-10-CM

## 2019-08-01 DIAGNOSIS — J9691 Respiratory failure, unspecified with hypoxia: Secondary | ICD-10-CM | POA: Diagnosis present

## 2019-08-01 DIAGNOSIS — R0602 Shortness of breath: Secondary | ICD-10-CM | POA: Diagnosis not present

## 2019-08-01 DIAGNOSIS — I519 Heart disease, unspecified: Secondary | ICD-10-CM | POA: Diagnosis not present

## 2019-08-01 DIAGNOSIS — J440 Chronic obstructive pulmonary disease with acute lower respiratory infection: Secondary | ICD-10-CM | POA: Diagnosis present

## 2019-08-01 DIAGNOSIS — K921 Melena: Secondary | ICD-10-CM | POA: Diagnosis present

## 2019-08-01 DIAGNOSIS — Z8249 Family history of ischemic heart disease and other diseases of the circulatory system: Secondary | ICD-10-CM | POA: Diagnosis not present

## 2019-08-01 DIAGNOSIS — Z8379 Family history of other diseases of the digestive system: Secondary | ICD-10-CM | POA: Diagnosis not present

## 2019-08-01 DIAGNOSIS — Z880 Allergy status to penicillin: Secondary | ICD-10-CM

## 2019-08-01 DIAGNOSIS — I10 Essential (primary) hypertension: Secondary | ICD-10-CM | POA: Diagnosis not present

## 2019-08-01 DIAGNOSIS — J90 Pleural effusion, not elsewhere classified: Secondary | ICD-10-CM | POA: Diagnosis not present

## 2019-08-01 DIAGNOSIS — Z79899 Other long term (current) drug therapy: Secondary | ICD-10-CM

## 2019-08-01 DIAGNOSIS — R042 Hemoptysis: Secondary | ICD-10-CM | POA: Diagnosis not present

## 2019-08-01 DIAGNOSIS — Z7982 Long term (current) use of aspirin: Secondary | ICD-10-CM

## 2019-08-01 DIAGNOSIS — R0600 Dyspnea, unspecified: Secondary | ICD-10-CM | POA: Diagnosis not present

## 2019-08-01 DIAGNOSIS — J189 Pneumonia, unspecified organism: Principal | ICD-10-CM | POA: Diagnosis present

## 2019-08-01 DIAGNOSIS — Z91041 Radiographic dye allergy status: Secondary | ICD-10-CM | POA: Diagnosis not present

## 2019-08-01 DIAGNOSIS — J439 Emphysema, unspecified: Secondary | ICD-10-CM | POA: Diagnosis not present

## 2019-08-01 LAB — CBC
HCT: 17.2 % — ABNORMAL LOW (ref 36.0–46.0)
Hemoglobin: 5.1 g/dL — ABNORMAL LOW (ref 12.0–15.0)
MCH: 28.5 pg (ref 26.0–34.0)
MCHC: 29.7 g/dL — ABNORMAL LOW (ref 30.0–36.0)
MCV: 96.1 fL (ref 80.0–100.0)
Platelets: 142 10*3/uL — ABNORMAL LOW (ref 150–400)
RBC: 1.79 MIL/uL — ABNORMAL LOW (ref 3.87–5.11)
RDW: 14.2 % (ref 11.5–15.5)
WBC: 6.8 10*3/uL (ref 4.0–10.5)
nRBC: 0 % (ref 0.0–0.2)

## 2019-08-01 LAB — PREPARE RBC (CROSSMATCH)

## 2019-08-01 LAB — CBC WITH DIFFERENTIAL/PLATELET
Abs Immature Granulocytes: 0.04 10*3/uL (ref 0.00–0.07)
Basophils Absolute: 0 10*3/uL (ref 0.0–0.1)
Basophils Relative: 0 %
Eosinophils Absolute: 0 10*3/uL (ref 0.0–0.5)
Eosinophils Relative: 0 %
HCT: 20.9 % — ABNORMAL LOW (ref 36.0–46.0)
Hemoglobin: 6.3 g/dL — ABNORMAL LOW (ref 12.0–15.0)
Immature Granulocytes: 0 %
Lymphocytes Relative: 4 %
Lymphs Abs: 0.3 10*3/uL — ABNORMAL LOW (ref 0.7–4.0)
MCH: 28.5 pg (ref 26.0–34.0)
MCHC: 30.1 g/dL (ref 30.0–36.0)
MCV: 94.6 fL (ref 80.0–100.0)
Monocytes Absolute: 0.3 10*3/uL (ref 0.1–1.0)
Monocytes Relative: 3 %
Neutro Abs: 8.6 10*3/uL — ABNORMAL HIGH (ref 1.7–7.7)
Neutrophils Relative %: 93 %
Platelets: 175 10*3/uL (ref 150–400)
RBC: 2.21 MIL/uL — ABNORMAL LOW (ref 3.87–5.11)
RDW: 14.3 % (ref 11.5–15.5)
WBC: 9.3 10*3/uL (ref 4.0–10.5)
nRBC: 0 % (ref 0.0–0.2)

## 2019-08-01 LAB — BASIC METABOLIC PANEL
Anion gap: 10 (ref 5–15)
BUN: 18 mg/dL (ref 6–20)
CO2: 39 mmol/L — ABNORMAL HIGH (ref 22–32)
Calcium: 8.8 mg/dL — ABNORMAL LOW (ref 8.9–10.3)
Chloride: 92 mmol/L — ABNORMAL LOW (ref 98–111)
Creatinine, Ser: 0.58 mg/dL (ref 0.44–1.00)
GFR calc Af Amer: >60 mL/min (ref >60)
GFR calc non Af Amer: >60 mL/min (ref >60)
Glucose, Bld: 135 mg/dL — ABNORMAL HIGH (ref 70–99)
Potassium: 4.8 mmol/L (ref 3.5–5.1)
Sodium: 141 mmol/L (ref 135–145)

## 2019-08-01 LAB — TROPONIN I (HIGH SENSITIVITY)
Troponin I (High Sensitivity): 18 ng/L — ABNORMAL HIGH (ref <18)
Troponin I (High Sensitivity): 17 ng/L (ref <18)

## 2019-08-01 LAB — BRAIN NATRIURETIC PEPTIDE: B Natriuretic Peptide: 135.6 pg/mL — ABNORMAL HIGH (ref 0.0–100.0)

## 2019-08-01 LAB — SARS CORONAVIRUS 2 BY RT PCR (HOSPITAL ORDER, PERFORMED IN ~~LOC~~ HOSPITAL LAB): SARS Coronavirus 2: NEGATIVE

## 2019-08-01 LAB — ABO/RH: ABO/RH(D): A POS

## 2019-08-01 LAB — LACTIC ACID, PLASMA: Lactic Acid, Venous: 0.6 mmol/L (ref 0.5–1.9)

## 2019-08-01 MED ORDER — SODIUM CHLORIDE 0.9 % IV SOLN
500.0000 mg | INTRAVENOUS | Status: DC
  Administered 2019-08-02 – 2019-08-03 (×2): 500 mg via INTRAVENOUS
  Filled 2019-08-01 (×3): qty 500

## 2019-08-01 MED ORDER — AMLODIPINE BESYLATE 10 MG PO TABS
10.0000 mg | ORAL_TABLET | Freq: Every day | ORAL | Status: DC
  Administered 2019-08-01 – 2019-08-04 (×4): 10 mg via ORAL
  Filled 2019-08-01: qty 1
  Filled 2019-08-01: qty 2
  Filled 2019-08-01: qty 1
  Filled 2019-08-01: qty 2

## 2019-08-01 MED ORDER — ALBUTEROL SULFATE (2.5 MG/3ML) 0.083% IN NEBU
2.5000 mg | INHALATION_SOLUTION | RESPIRATORY_TRACT | Status: DC | PRN
  Administered 2019-08-01 – 2019-08-02 (×3): 2.5 mg via RESPIRATORY_TRACT
  Filled 2019-08-01 (×3): qty 3

## 2019-08-01 MED ORDER — IPRATROPIUM-ALBUTEROL 0.5-2.5 (3) MG/3ML IN SOLN
3.0000 mL | Freq: Once | RESPIRATORY_TRACT | Status: AC
  Administered 2019-08-01: 3 mL via RESPIRATORY_TRACT
  Filled 2019-08-01: qty 3

## 2019-08-01 MED ORDER — NICOTINE 21 MG/24HR TD PT24
21.0000 mg | MEDICATED_PATCH | Freq: Every day | TRANSDERMAL | Status: DC
  Administered 2019-08-01 – 2019-08-04 (×3): 21 mg via TRANSDERMAL
  Filled 2019-08-01 (×4): qty 1

## 2019-08-01 MED ORDER — SODIUM CHLORIDE 0.9 % IV SOLN
500.0000 mg | Freq: Once | INTRAVENOUS | Status: AC
  Administered 2019-08-01: 500 mg via INTRAVENOUS
  Filled 2019-08-01: qty 500

## 2019-08-01 MED ORDER — SODIUM CHLORIDE 0.9 % IV SOLN
1.0000 g | Freq: Once | INTRAVENOUS | Status: AC
  Administered 2019-08-01: 1 g via INTRAVENOUS
  Filled 2019-08-01: qty 10

## 2019-08-01 MED ORDER — ENOXAPARIN SODIUM 40 MG/0.4ML ~~LOC~~ SOLN: 40.0000 mg | SUBCUTANEOUS | Status: DC

## 2019-08-01 MED ORDER — SODIUM CHLORIDE 0.9 % IV SOLN
2.0000 g | INTRAVENOUS | Status: DC
  Administered 2019-08-02 – 2019-08-04 (×3): 2 g via INTRAVENOUS
  Filled 2019-08-01: qty 2
  Filled 2019-08-01: qty 20
  Filled 2019-08-01: qty 2

## 2019-08-01 MED ORDER — GUAIFENESIN ER 600 MG PO TB12
1200.0000 mg | ORAL_TABLET | Freq: Two times a day (BID) | ORAL | Status: DC | PRN
  Administered 2019-08-02: 1200 mg via ORAL
  Filled 2019-08-01: qty 2

## 2019-08-01 MED ORDER — SODIUM CHLORIDE 0.9 % IV SOLN
10.0000 mL/h | Freq: Once | INTRAVENOUS | Status: AC
  Administered 2019-08-01: 10 mL/h via INTRAVENOUS

## 2019-08-01 MED ORDER — CYCLOBENZAPRINE HCL 10 MG PO TABS
10.0000 mg | ORAL_TABLET | Freq: Two times a day (BID) | ORAL | Status: DC | PRN
  Administered 2019-08-02 – 2019-08-04 (×3): 10 mg via ORAL
  Filled 2019-08-01 (×3): qty 1

## 2019-08-01 MED ORDER — IPRATROPIUM-ALBUTEROL 0.5-2.5 (3) MG/3ML IN SOLN
RESPIRATORY_TRACT | Status: AC
  Administered 2019-08-01: 3 mL via RESPIRATORY_TRACT
  Filled 2019-08-01: qty 3

## 2019-08-01 MED ORDER — PANTOPRAZOLE SODIUM 20 MG PO TBEC
20.0000 mg | DELAYED_RELEASE_TABLET | Freq: Every day | ORAL | Status: DC
  Administered 2019-08-02 – 2019-08-04 (×3): 20 mg via ORAL
  Filled 2019-08-01 (×3): qty 1

## 2019-08-01 MED ORDER — ASPIRIN EC 81 MG PO TBEC
81.0000 mg | DELAYED_RELEASE_TABLET | Freq: Every day | ORAL | Status: DC
  Administered 2019-08-01 – 2019-08-04 (×4): 81 mg via ORAL
  Filled 2019-08-01 (×4): qty 1

## 2019-08-01 MED ORDER — ARFORMOTEROL TARTRATE 15 MCG/2ML IN NEBU
15.0000 ug | INHALATION_SOLUTION | Freq: Two times a day (BID) | RESPIRATORY_TRACT | Status: DC
  Administered 2019-08-02 – 2019-08-04 (×3): 15 ug via RESPIRATORY_TRACT
  Filled 2019-08-01 (×6): qty 2

## 2019-08-01 MED ORDER — MORPHINE SULFATE (CONCENTRATE) 10 MG/0.5ML PO SOLN
5.0000 mg | ORAL | Status: DC | PRN
  Administered 2019-08-02 – 2019-08-04 (×12): 5 mg via ORAL
  Filled 2019-08-01 (×13): qty 0.5

## 2019-08-01 MED ORDER — IPRATROPIUM-ALBUTEROL 0.5-2.5 (3) MG/3ML IN SOLN
3.0000 mL | Freq: Four times a day (QID) | RESPIRATORY_TRACT | Status: DC
  Administered 2019-08-01 – 2019-08-02 (×3): 3 mL via RESPIRATORY_TRACT
  Filled 2019-08-01 (×3): qty 3

## 2019-08-01 MED ORDER — SERTRALINE HCL 50 MG PO TABS
100.0000 mg | ORAL_TABLET | Freq: Every day | ORAL | Status: DC
  Administered 2019-08-01 – 2019-08-04 (×4): 100 mg via ORAL
  Filled 2019-08-01 (×4): qty 2

## 2019-08-01 MED ORDER — HYDROXYZINE HCL 25 MG PO TABS: 25.0000 mg | ORAL_TABLET | Freq: Three times a day (TID) | ORAL | Status: DC | PRN

## 2019-08-01 MED ORDER — IPRATROPIUM-ALBUTEROL 0.5-2.5 (3) MG/3ML IN SOLN: 3.0000 mL | Freq: Once | RESPIRATORY_TRACT | Status: AC

## 2019-08-01 MED ORDER — LORATADINE 10 MG PO TABS
10.0000 mg | ORAL_TABLET | Freq: Every day | ORAL | Status: DC
  Administered 2019-08-01 – 2019-08-04 (×4): 10 mg via ORAL
  Filled 2019-08-01 (×4): qty 1

## 2019-08-01 MED ORDER — BUDESONIDE 0.5 MG/2ML IN SUSP
0.5000 mg | Freq: Two times a day (BID) | RESPIRATORY_TRACT | Status: DC
  Administered 2019-08-02 – 2019-08-04 (×4): 0.5 mg via RESPIRATORY_TRACT
  Filled 2019-08-01 (×6): qty 2

## 2019-08-01 MED ORDER — METHYLPREDNISOLONE SODIUM SUCC 40 MG IJ SOLR
40.0000 mg | Freq: Four times a day (QID) | INTRAMUSCULAR | Status: DC
  Administered 2019-08-01 – 2019-08-04 (×10): 40 mg via INTRAVENOUS
  Filled 2019-08-01 (×10): qty 1

## 2019-08-01 MED ORDER — ADULT MULTIVITAMIN W/MINERALS CH
1.0000 | ORAL_TABLET | Freq: Every day | ORAL | Status: DC
  Administered 2019-08-01 – 2019-08-04 (×4): 1 via ORAL
  Filled 2019-08-01 (×4): qty 1

## 2019-08-01 MED ORDER — ATORVASTATIN CALCIUM 20 MG PO TABS
40.0000 mg | ORAL_TABLET | Freq: Every day | ORAL | Status: DC
  Administered 2019-08-01 – 2019-08-04 (×4): 40 mg via ORAL
  Filled 2019-08-01 (×4): qty 2

## 2019-08-01 NOTE — ED Notes (Signed)
Iv meds infusing    Family with pt

## 2019-08-01 NOTE — Telephone Encounter (Signed)
Attempted to contact the patient. If the patient calls back please transfer the call directly to me.

## 2019-08-01 NOTE — H&P (Signed)
History and Physical    Laconya Clere PXT:[id number] DOB: Nov 24, 1960 DOA: 08/01/2019  PCP: [name], FNP   Patient coming from: Home  I have personally briefly reviewed patient's old medical records in [facility]  Chief Complaint: Shortness of breath  HPI: Recia Sons is a 59 y.o. female with medical history significant for HTN, depression/anxiety, end-stage COPD with chronic respiratory failure on home O2 at 3 L, followed by pulmonologist, [name], with recent history on 06/25/2019 of spontaneous left pneumothorax requiring chest tube placement, with rehospitalization again from 6/16 to 07/13/2019 with worsening respiratory failure,who returns to the ER now with worsening shortness of breath and hypoxia.  Her roommate apparently went home early as she was not answering the phone and found her to be in bed struggling to breathe with O2 sats 45% on her home flow rate, improving to 85% with O2 up to 4 L.  EMS administered albuterol in route.  Patient was more awake on arrival.  Patient denied fever or chills, denied chest pain, abdominal pain, nausea or vomiting.  Initial work-up in the emergency room revealed hemoglobin of 6.1.  Patient admits intermittent hemoptysis, but just scant amounts of blood-streaked sputum.  Very occasionally, he passes blood in her stool.  ED Course: On arrival she was tachypneic with respiratory rate 24-32, with O2 sat 96% on 3 L.  Blood gas not done on arrival.  Blood work significant for hemoglobin of 6.3, which on repeat was 5.1.  Baseline 10.4.  WBC within normal limits, normal lactic acid.  Troponins negative.  EKG sinus rhythm.  Chest x-ray showed densities in the right upper and lower lobes concerning for pneumonia.  Patient was started on Rocephin and azithromycin, given DuoNeb treatments and transfusion of 2 units red blood cells started.  Hospitalist consulted for admission.  Review of Systems: As per HPI otherwise all other systems on review of  systems negative.    Past Medical History:  Diagnosis Date  . Allergy   . Anxiety   . Colitis    recurrent  . COPD (chronic obstructive pulmonary disease) ([facility])   . Depression   . Graves disease    s/p iodine radiation treatment - euthyroid for many years per pt  . Hyperlipidemia   . Hypertension   . Loss of eye, LEFT 2007    Past Surgical History:  Procedure Laterality Date  . ABDOMINAL HYSTERECTOMY    . FRACTURE SURGERY       reports that she has quit smoking. Her smoking use included cigarettes. She smoked 0.10 packs per day. She has never used smokeless tobacco. She reports that she does not drink alcohol and does not use drugs.  Allergies  Allergen Reactions  . Contrast Allergy Premed Pack [Prednisone & Diphenhydramine] Anaphylaxis    Patient states she had iv contrast in 2008 which caused her throat to close and difficulty breathing.   . Iodine Anaphylaxis  . Codeine Hives and Nausea And Vomiting  . Shellfish Allergy Hives  . Penicillins Hives and Rash    Family History  Problem Relation Age of Onset  . Heart disease Father   . Alcohol abuse Father   . Alcohol abuse Brother   . Heart failure Sister   . Celiac disease Sister   . Healthy Sister       Prior to Admission medications   Medication Sig Start Date End Date Taking? Authorizing Provider  albuterol (PROVENTIL) (2.5 MG/3ML) 0.083% nebulizer solution Take 3 mLs (2.5 mg total)  by nebulization 4 (four) times daily. 11/13/18  Yes Galen ManilaKennedy, Lauren Renee, NP  amLODipine (NORVASC) 10 MG tablet Take 1 tablet (10 mg total) by mouth daily. 06/26/19  Yes Drema DallasWoods, Curtis J, MD  aspirin EC 81 MG tablet Take 81 mg by mouth daily.   Yes [provider]  atorvastatin (LIPITOR) 40 MG tablet TAKE 1 TABLET(40 MG) BY MOUTH DAILY 06/25/19  Yes Malfi, Jodelle GrossNicole M, FNP  budesonide (PULMICORT) 0.5 MG/2ML nebulizer solution Take 2 mLs (0.5 mg total) by nebulization 2 (two) times daily. 05/27/19 05/26/20 Yes Malfi, Jodelle GrossNicole M, FNP   cetirizine (ZYRTEC) 10 MG tablet Take 1 tablet (10 mg total) by mouth daily. 05/27/19  Yes Malfi, Jodelle GrossNicole M, FNP  cyclobenzaprine (FLEXERIL) 10 MG tablet TAKE 1 TABLET BY MOUTH 2 TIMES DAILY AS NEEDED FOR MUSCLE SPASMS Patient taking differently: Take 10 mg by mouth 2 (two) times daily as needed for muscle spasms.  05/27/19  Yes Malfi, Jodelle GrossNicole M, FNP  formoterol (PERFOROMIST) 20 MCG/2ML nebulizer solution Take 2 mLs (20 mcg total) by nebulization 2 (two) times daily. 08/01/18  Yes Erin FullingKasa, Kurian, MD  hydrOXYzine (ATARAX/VISTARIL) 25 MG tablet Take 1 tablet (25 mg total) by mouth 3 (three) times daily as needed for anxiety. 06/25/19  Yes Drema DallasWoods, Curtis J, MD  Morphine Sulfate (MORPHINE CONCENTRATE) 10 MG/0.5ML SOLN concentrated solution Take 0.25 mLs (5 mg total) by mouth every 4 (four) hours as needed for severe pain or shortness of breath. 07/13/19  Yes Rai, Ripudeep K, MD  Multiple Vitamin (MULTIVITAMIN WITH MINERALS) TABS tablet Take 1 tablet by mouth daily.   Yes [provider]  nicotine (NICODERM CQ - DOSED IN MG/24 HOURS) 21 mg/24hr patch Place 1 patch (21 mg total) onto the skin daily. 06/26/19  Yes Drema DallasWoods, Curtis J, MD  omeprazole (PRILOSEC OTC) 20 MG tablet Take 1 tablet (20 mg total) by mouth daily. 05/27/19  Yes Malfi, Jodelle GrossNicole M, FNP  predniSONE (DELTASONE) 10 MG tablet Prednisone dosing: Take  Prednisone 40mg  (4 tabs) x 3 days, then taper to 30mg  (3 tabs) x 3 days, then 20mg  (2 tabs) x 3days, then 10mg  (1 tab) x 3days, then OFF. 07/13/19  Yes Rai, Ripudeep K, MD  sertraline (ZOLOFT) 100 MG tablet Take 1 tablet (100 mg total) by mouth daily. 05/27/19  Yes Malfi, Jodelle GrossNicole M, FNP  albuterol (VENTOLIN HFA) 108 (90 Base) MCG/ACT inhaler INHALE 2 PUFFS INTO THE LUNGS EVERY 6 HOURS AS NEEDED FOR WHEEZING OR SHORTNESS OF BREATH Patient taking differently: Inhale 2 puffs into the lungs every 6 (six) hours as needed for wheezing or shortness of breath.  05/18/19   Malfi, Jodelle GrossNicole M, FNP  guaiFENesin (MUCINEX) 600 MG  12 hr tablet Take 2 tablets (1,200 mg total) by mouth 2 (two) times daily as needed for cough or to loosen phlegm. 05/27/19   Tarri FullerMalfi, Nicole M, FNP    Physical Exam: Vitals:   08/01/19 1840 08/01/19 1900 08/01/19 1930 08/01/19 2000  BP:  108/61 126/75 124/70  Pulse: 91  90 90  Resp: (!) 21 (!) 23 (!) 21 (!) 22  Temp:      TempSrc:      SpO2: 90%  94% 99%  Weight:      Height:         Vitals:   08/01/19 1840 08/01/19 1900 08/01/19 1930 08/01/19 2000  BP:  108/61 126/75 124/70  Pulse: 91  90 90  Resp: (!) 21 (!) 23 (!) 21 (!) 22  Temp:  TempSrc:      SpO2: 90%  94% 99%  Weight:      Height:          Constitutional: Patient appears chronically ill.  She is oriented x3.  Mild tachypnea but not in severe acute distress HEENT:      Head: Normocephalic and atraumatic.         Eyes:  Blindness left eye, Conjunctivae pale. Sclera is non-icteric.       Mouth/Throat: Mucous membranes are moist.       Neck: Supple with no signs of meningismus. Cardiovascular: Regular rate and rhythm. No murmurs, gallops, or rubs. 2+ symmetrical distal pulses are present . No JVD. No LE edema Respiratory: Respiratory effort increased.right-sided crackles and wheezing Gastrointestinal: Soft, non tender, and non distended with positive bowel sounds. No rebound or guarding. Genitourinary: No CVA tenderness. Musculoskeletal: Nontender with normal range of motion in all extremities. No cyanosis, or erythema of extremities. Neurologic: Normal speech and language. Face is symmetric. Moving all extremities. No gross focal neurologic deficits . Skin: Skin is warm, dry.  No rash or ulcers Psychiatric: Mood and affect are normal Speech and behavior are normal   Labs on Admission: I have personally reviewed following labs and imaging studies  CBC: Recent Labs  Lab 08/01/19 1649 08/01/19 1749  WBC 9.3 6.8  NEUTROABS 8.6*  --   HGB 6.3* 5.1*  HCT 20.9* 17.2*  MCV 94.6 96.1  PLT 175 142*   Basic  Metabolic Panel: Recent Labs  Lab 08/01/19 1649  NA 141  K 4.8  CL 92*  CO2 39*  GLUCOSE 135*  BUN 18  CREATININE 0.58  CALCIUM 8.8*   GFR: Estimated Creatinine Clearance: 72.6 mL/min (by C-G formula based on SCr of 0.58 mg/dL). Liver Function Tests: No results for input(s): AST, ALT, ALKPHOS, BILITOT, PROT, ALBUMIN in the last 168 hours. No results for input(s): LIPASE, AMYLASE in the last 168 hours. No results for input(s): AMMONIA in the last 168 hours. Coagulation Profile: No results for input(s): INR, PROTIME in the last 168 hours. Cardiac Enzymes: No results for input(s): CKTOTAL, CKMB, CKMBINDEX, TROPONINI in the last 168 hours. BNP (last 3 results) No results for input(s): PROBNP in the last 8760 hours. HbA1C: No results for input(s): HGBA1C in the last 72 hours. CBG: No results for input(s): GLUCAP in the last 168 hours. Lipid Profile: No results for input(s): CHOL, HDL, LDLCALC, TRIG, CHOLHDL, LDLDIRECT in the last 72 hours. Thyroid Function Tests: No results for input(s): TSH, T4TOTAL, FREET4, T3FREE, THYROIDAB in the last 72 hours. Anemia Panel: No results for input(s): VITAMINB12, FOLATE, FERRITIN, TIBC, IRON, RETICCTPCT in the last 72 hours. Urine analysis:    Component Value Date/Time   APPEARANCEUR Clear 02/01/2018 1306   GLUCOSEU Negative 02/01/2018 1306   BILIRUBINUR Negative 02/01/2018 1306   PROTEINUR Negative 02/01/2018 1306   NITRITE Negative 02/01/2018 1306   LEUKOCYTESUR Negative 02/01/2018 1306    Radiological Exams on Admission: DG Chest Port 1 View  Result Date: 08/01/2019 CLINICAL DATA:  Shortness of breath. EXAM: PORTABLE CHEST 1 VIEW COMPARISON:  CT chest dated July 11, 2019. Chest x-ray dated July 10, 2019. FINDINGS: The heart size and mediastinal contours are within normal limits. The lungs remain hyperinflated with emphysematous changes. Increased reticulonodular densities in the right upper and lower lobes. Improved reticulonodular  densities in the left lower lobe. Unchanged two large smoothly marginated mass-like opacities in the left lung, previously demonstrated to reflect loculated pleural fluid in the  major fissure. Previously seen non loculated small left pleural effusion has resolved. No pneumothorax. No acute osseous abnormality. IMPRESSION: 1. Worsening reticulonodular densities in the right upper and lower lobes concerning for pneumonia. Given interval worsening in the right lung with improvement in the left lower lobe, findings may reflect atypical infection. 2. Unchanged loculated pleural fluid in the left major fissure with resolved small non loculated left pleural effusion. Electronically Signed   By: Obie Dredge M.D.   On: 08/01/2019 17:35    EKG: Independently reviewed. Interpretation : Sinus rhythm rate 92 with no acute ST-T wave changes  Assessment/Plan 59 year old female with history of advanced COPD on home O2 at 3 L, with history of spontaneous pneumothorax 06/25/2019, presenting with shortness of breath and hypoxia.  Right pneumonia on chest x-ray, Hb 5.1.    Acute on chronic respiratory failure with hypoxia ([facility]CC) -Patient presents with shortness of breath and increased O2 requirement, satting at 86% at home on 4 L.  Chest x-ray showing right-sided pneumonia and small loculated left pleural effusion -Suspect multifactorial and related to pneumonia, COPD exacerbation as well as symptomatic anemia.  No evidence of repeat pneumothorax on chest x-ray -Oxygen supplementation to keep sats over 90%    Pneumonia involving right lung -Right-sided pneumonia involving both upper and lower lobes -Rocephin and azithromycin -Consider adding vancomycin for possible hospital-acquired pneumonia given recent hospitalization 6/16-6/19 -Supplemental O2 as needed, mucolytic's    Symptomatic anemia -Hemoglobin of 6.3>>5.1, baseline 10.4.  -Patient reports intermittent bright red blood with stool, intermittent  hemoptysis -Follow-up stool for occult blood -Continue transfusion of 2 units packed red blood cells started in the emergency room -Serial H&H posttransfusion    COPD exacerbation (HCC) -Scheduled and as needed bronchodilator treatments -Continue home Perforomist and formoterol via nebulizer -IV Solu-Medrol.  History of anaphylactic reaction to prednisone.  Pharmacy consulted for guidance: Patient has tolerated Solu-Medrol in the recent past -Supplemental oxygen as above    History of spontaneous left pneumothorax 06/2019 -No evidence of repeat pneumothorax on chest x-ray    DVT prophylaxis: SCDs given severe anemia Code Status: full code  Family Communication:  none  Disposition Plan: Back to previous home environment Consults called: none  Status:At the time of admission, it appears that the appropriate admission status for this patient is INPATIENT. This is judged to be reasonable and necessary in order to provide the required intensity of service to ensure the patient's safety given the presenting symptoms, physical exam findings, and initial radiographic and laboratory data in the context of their  Comorbid conditions.   Patient requires inpatient status due to high intensity of service, high risk for further deterioration and high frequency of surveillance required.   I certify that at the point of admission it is my clinical judgment that the patient will require inpatient hospital care spanning beyond 2 midnights     Andris Baumann MD [facility]     08/01/2019, 8:57 PM

## 2019-08-01 NOTE — ED Notes (Signed)
Report off to mac rn  

## 2019-08-01 NOTE — ED Provider Notes (Signed)
ER Provider Note       Time seen: 5:44 PM   I have reviewed the vital signs and the nursing notes.  HISTORY   Chief Complaint Shortness of Breath   HPI Alicia Gibson is a 59 y.o. female with a history of allergies, anxiety, COPD, depression, hyperlipidemia, hypertension, pneumothorax who presents today for shortness of breath.  Reportedly she was given a nebulizer treatment prior to arrival for shortness of breath which has improved some.  Discomfort is 9 out of 10.  She is having some left-sided chest pain from recent pneumothorax and chest tube placement.  Past Medical History:  Diagnosis Date  . Allergy   . Anxiety   . Colitis    recurrent  . COPD (chronic obstructive pulmonary disease) (HCC)   . Depression   . Graves disease    s/p iodine radiation treatment - euthyroid for many years per pt  . Hyperlipidemia   . Hypertension   . Loss of eye, LEFT 2007    Past Surgical History:  Procedure Laterality Date  . ABDOMINAL HYSTERECTOMY    . FRACTURE SURGERY      Allergies Contrast allergy premed pack [prednisone & diphenhydramine], Iodine, Codeine, Shellfish allergy, and Penicillins  Review of Systems Constitutional: Negative for fever. Cardiovascular: Positive for chest pain Respiratory: Positive for difficulty breathing Gastrointestinal: Negative for abdominal pain, vomiting and diarrhea. Musculoskeletal: Negative for back pain. Skin: Negative for rash. Neurological: Negative for headaches, focal weakness or numbness.  All systems negative/normal/unremarkable except as stated in the HPI  ____________________________________________   PHYSICAL EXAM:  VITAL SIGNS: Vitals:   08/01/19 1654  BP: 128/69  Pulse: 95  Resp: (!) 2  Temp: 98.1 F (36.7 C)  SpO2: 96%    Constitutional: Alert and oriented.  Chronically ill-appearing, moderate distress Eyes: Conjunctivae are normal.  Cardiovascular: Normal rate, regular rhythm. No murmurs, rubs, or  gallops. Respiratory: Diminished breath sounds bilaterally, wheezing and rhonchi are noted Gastrointestinal: Soft and nontender. Normal bowel sounds Musculoskeletal: Nontender with normal range of motion in extremities. No lower extremity tenderness nor edema. Neurologic:  Normal speech and language. No gross focal neurologic deficits are appreciated.  Skin:  Skin is warm, dry and intact. No rash noted. Psychiatric: Speech and behavior are normal.  ____________________________________________  EKG: Interpreted by me.  Sinus rhythm with rate of 92 bpm, normal PR interval, normal QRS, normal QT  ____________________________________________   LABS (pertinent positives/negatives)  Labs Reviewed  CBC WITH DIFFERENTIAL/PLATELET - Abnormal; Notable for the following components:      Result Value   RBC 2.21 (*)    Hemoglobin 6.3 (*)    HCT 20.9 (*)    Neutro Abs 8.6 (*)    Lymphs Abs 0.3 (*)    All other components within normal limits  BASIC METABOLIC PANEL - Abnormal; Notable for the following components:   Chloride 92 (*)    CO2 39 (*)    Glucose, Bld 135 (*)    Calcium 8.8 (*)    All other components within normal limits  BRAIN NATRIURETIC PEPTIDE - Abnormal; Notable for the following components:   B Natriuretic Peptide 135.6 (*)    All other components within normal limits  CBC - Abnormal; Notable for the following components:   RBC 1.79 (*)    Hemoglobin 5.1 (*)    HCT 17.2 (*)    MCHC 29.7 (*)    Platelets 142 (*)    All other components within normal limits  TROPONIN I (HIGH SENSITIVITY) -  Abnormal; Notable for the following components:   Troponin I (High Sensitivity) 18 (*)    All other components within normal limits  CULTURE, BLOOD (ROUTINE X 2)  CULTURE, BLOOD (ROUTINE X 2)  SARS CORONAVIRUS 2 BY RT PCR (HOSPITAL ORDER, PERFORMED IN [facility])  LACTIC ACID, PLASMA  BLOOD GAS, VENOUS  LACTIC ACID, PLASMA  OCCULT BLOOD X 1 CARD TO LAB, STOOL  TYPE  AND SCREEN  PREPARE RBC (CROSSMATCH)  TROPONIN I (HIGH SENSITIVITY)   CRITICAL CARE Performed by: Ulice Dash   Total critical care time: 30 minutes  Critical care time was exclusive of separately billable procedures and treating other patients.  Critical care was necessary to treat or prevent imminent or life-threatening deterioration.  Critical care was time spent personally by me on the following activities: development of treatment plan with patient and/or surrogate as well as nursing, discussions with consultants, evaluation of patient's response to treatment, examination of patient, obtaining history from patient or surrogate, ordering and performing treatments and interventions, ordering and review of laboratory studies, ordering and review of radiographic studies, pulse oximetry and re-evaluation of patient's condition.  RADIOLOGY  Images were viewed by me Chest x-ray IMPRESSION: 1. Worsening reticulonodular densities in the right upper and lower lobes concerning for pneumonia. Given interval worsening in the right lung with improvement in the left lower lobe, findings may reflect atypical infection. 2. Unchanged loculated pleural fluid in the left major fissure with resolved small non loculated left pleural effusion.   DIFFERENTIAL DIAGNOSIS  CHF, COPD, pneumonia, COVID-19, anemia, sepsis  ASSESSMENT AND PLAN  Dyspnea, symptomatic anemia, pneumonia   Plan: The patient had presented for dyspnea and hypoxia. Patient's labs did reveal a new anemia with a hemoglobin of 6.3.  Troponin also slightly elevated which is likely demand related, CO2 appears to be chronically elevated from chronic respiratory failure.  We gave additional breathing treatments on arrival.  X-ray reveals worsening in the right lung resembling atypical infection and reticulonodular densities in the right upper and lower lobes concerning for pneumonia.  I had ordered IV Rocephin and  azithromycin for her.  She also has a profound anemia of uncertain etiology, she will require Hemoccult testing.  I have ordered 2 units of packed red blood cells for her.  I will discuss with the hospitalist for admission.  Daryel November MD    Note: This note was generated in part or whole with voice recognition software. Voice recognition is usually quite accurate but there are transcription errors that can and very often do occur. I apologize for any typographical errors that were not detected and corrected.     Emily Filbert, MD 08/01/19 Serena Croissant

## 2019-08-01 NOTE — ED Notes (Signed)
Pt ambulated to bathroom. Oxygen saturations dropped as low as 76% with good waveform. MD Mayford Knife notified, duoneb ordered. Pt O2 increased to 6L . Pt normally on 3L. Oxygen sats improved to 95%

## 2019-08-01 NOTE — ED Triage Notes (Signed)
Pt brought in via ems from home with sob.    No chest pain.  Iv in place.  md at bedside.  Pt receiving alb tx on arrival.  Pt alert.

## 2019-08-01 NOTE — Telephone Encounter (Signed)
I received a phone call from Melina Schools the patient roommate who informed me that she came home from work early because she had been trying to reach the patient and she wasn't answering the phone. She informed me that the patient was in the bed struggling to breathing oxygen at 45% SPO and disoriented. She said she cranked her oxygen tank up to 4L and got her oxygen levels up to 85%. She said the patient been in a lot of pain and with a lack of appetite for the last few days. I informed the patient friend per Joni Reining she need to contact 911 immediately. She verbalize understanding, no question or concern.

## 2019-08-02 ENCOUNTER — Other Ambulatory Visit: Payer: Self-pay

## 2019-08-02 ENCOUNTER — Inpatient Hospital Stay: Payer: Medicaid Other

## 2019-08-02 DIAGNOSIS — J9622 Acute and chronic respiratory failure with hypercapnia: Secondary | ICD-10-CM | POA: Diagnosis not present

## 2019-08-02 DIAGNOSIS — J441 Chronic obstructive pulmonary disease with (acute) exacerbation: Secondary | ICD-10-CM | POA: Diagnosis not present

## 2019-08-02 DIAGNOSIS — D649 Anemia, unspecified: Secondary | ICD-10-CM

## 2019-08-02 DIAGNOSIS — J449 Chronic obstructive pulmonary disease, unspecified: Secondary | ICD-10-CM

## 2019-08-02 DIAGNOSIS — R0600 Dyspnea, unspecified: Secondary | ICD-10-CM | POA: Diagnosis not present

## 2019-08-02 DIAGNOSIS — J9621 Acute and chronic respiratory failure with hypoxia: Secondary | ICD-10-CM | POA: Diagnosis not present

## 2019-08-02 DIAGNOSIS — I519 Heart disease, unspecified: Secondary | ICD-10-CM

## 2019-08-02 DIAGNOSIS — J189 Pneumonia, unspecified organism: Principal | ICD-10-CM

## 2019-08-02 LAB — BLOOD GAS, ARTERIAL
Acid-Base Excess: 17.3 mmol/L — ABNORMAL HIGH (ref 0.0–2.0)
Bicarbonate: 43.1 mmol/L — ABNORMAL HIGH (ref 20.0–28.0)
FIO2: 0.36
O2 Saturation: 90.3 %
Patient temperature: 37
pCO2 arterial: 54 mmHg — ABNORMAL HIGH (ref 32.0–48.0)
pH, Arterial: 7.51 — ABNORMAL HIGH (ref 7.350–7.450)
pO2, Arterial: 53 mmHg — ABNORMAL LOW (ref 83.0–108.0)

## 2019-08-02 LAB — HIV ANTIBODY (ROUTINE TESTING W REFLEX): HIV Screen 4th Generation wRfx: NONREACTIVE

## 2019-08-02 LAB — HEMOGLOBIN AND HEMATOCRIT, BLOOD
HCT: 41.3 % (ref 36.0–46.0)
Hemoglobin: 12.8 g/dL (ref 12.0–15.0)

## 2019-08-02 MED ORDER — IPRATROPIUM BROMIDE 0.02 % IN SOLN
0.5000 mg | Freq: Four times a day (QID) | RESPIRATORY_TRACT | Status: DC
  Administered 2019-08-02 – 2019-08-04 (×7): 0.5 mg via RESPIRATORY_TRACT
  Filled 2019-08-02 (×8): qty 2.5

## 2019-08-02 MED ORDER — LEVALBUTEROL HCL 0.63 MG/3ML IN NEBU
0.6300 mg | INHALATION_SOLUTION | Freq: Four times a day (QID) | RESPIRATORY_TRACT | Status: DC
  Administered 2019-08-02 – 2019-08-04 (×7): 0.63 mg via RESPIRATORY_TRACT
  Filled 2019-08-02 (×7): qty 3

## 2019-08-02 MED ORDER — ENSURE ENLIVE PO LIQD
237.0000 mL | Freq: Three times a day (TID) | ORAL | Status: DC
  Administered 2019-08-02 – 2019-08-03 (×2): 237 mL via ORAL

## 2019-08-02 MED ORDER — FUROSEMIDE 10 MG/ML IJ SOLN
40.0000 mg | Freq: Once | INTRAMUSCULAR | Status: AC
  Administered 2019-08-02: 40 mg via INTRAVENOUS
  Filled 2019-08-02: qty 4

## 2019-08-02 MED ORDER — LEVALBUTEROL HCL 0.63 MG/3ML IN NEBU: 0.6300 mg | INHALATION_SOLUTION | RESPIRATORY_TRACT | Status: DC | PRN

## 2019-08-02 NOTE — Progress Notes (Signed)
[facility] PROGRESS NOTE   Alicia Gibson OBS:[id number] DOB: 15-Jan-1961 DOA: 08/01/2019  PCP: [name], FNP  Brief History/Interval Summary: 59 y.o. female with medical history significant for HTN, depression/anxiety, end-stage COPD with chronic respiratory failure on home O2 at 3 L, followed by pulmonologist, [name], with recent history on 06/25/2019 of spontaneous left pneumothorax requiring chest tube placement, with rehospitalization again from 6/16 to 07/13/2019 with worsening respiratory failure,who returns to the ER now with worsening shortness of breath and hypoxia.  Her roommate apparently went home early as she was not answering the phone and found her to be in bed struggling to breathe with O2 sats 45% on her home flow rate, improving to 85% with O2 up to 4 L.  EMS administered albuterol in route.    She was hospitalized for further management.  Reason for Visit: Acute respiratory failure with hypoxia.  COPD exacerbation.  Anemia  Consultants: Pulmonology  Procedures: Transfusion of 2 units of PRBC  Antibiotics: Anti-infectives (From admission, onward)   Start     Dose/Rate Route Frequency Ordered Stop   08/01/19 1800  cefTRIAXone (ROCEPHIN) 1 g in sodium chloride 0.9 % 100 mL IVPB        1 g 200 mL/hr over 30 Minutes Intravenous  Once 08/01/19 1749 08/01/19 2012   08/01/19 1800  azithromycin (ZITHROMAX) 500 mg in sodium chloride 0.9 % 250 mL IVPB        500 mg 250 mL/hr over 60 Minutes Intravenous  Once 08/01/19 1749 08/01/19 2120   08/01/19 1800  azithromycin (ZITHROMAX) 500 mg in sodium chloride 0.9 % 250 mL IVPB     Discontinue     500 mg 250 mL/hr over 60 Minutes Intravenous Every 24 hours 08/01/19 2056 08/06/19 1759   08/01/19 0800  cefTRIAXone (ROCEPHIN) 2 g in sodium chloride 0.9 % 100 mL IVPB     Discontinue     2 g 200 mL/hr over 30 Minutes Intravenous Every 24 hours 08/01/19 2056 08/06/19 0759      Subjective/Interval History: Patient mentions  that she is having difficulty breathing this morning.  Denies any chest pain.  Has a dry cough.  She also mentions occasional finding of blood in the toilet bowl though not on a daily basis.  Denies any history of hemorrhoids.  No black-colored stool has been noted.  ROS: Had some nausea but no vomiting.    Assessment/Plan:  Acute on chronic respiratory failure with hypoxia Patient initially came in saturating in the mid 80s.  She uses oxygen at home at 3 L/min.  Chest x-ray showed suggested right-sided pneumonia.  She has 2 areas in her left lung which have previously been demonstrated as loculated fluid collection. Patient was to follow up with pulmonology, [name].  We will consult them to assist with management.  Patient is currently saturating in the early 90s.  Since she did receive blood transfusion we will give her a dose of Lasix as she may be experiencing some fluid overload as well.  ABG was done which shows a pH of 7.5 and a PCO2 of 53.  She is likely a chronic CO2 retainer.  Continue to monitor patient closely.  May need to use BiPAP if she does not improve.  Community-acquired pneumonia She has pneumonia involving the right lung.  She is currently on ceftriaxone and azithromycin.  WBC is normal.  She is afebrile.  Continue current agents for now.  Follow-up on cultures.  History of COPD with  acute exacerbation Patient appears to have advanced COPD.  Continue with her home inhaler regimen.  Antibiotics as discussed above.  Nebulizer treatments.  She has also been started on systemic steroids which will be continued.  ABG reviewed.  Pulmonology consulted.  Normocytic anemia Hemoglobin noted to be 6.3.  Hemoglobin was 10.4 in June.  It was repeated and was noted to be 5.1.  Patient was ordered 2 units of PRBC.  Lasix was given as discussed above.  Repeat hemoglobin after transfusion came out to be 12.8.  Unclear as to the reason for this discrepancy.  We will recheck it again tomorrow.   Patient did mention occasional blood in stool.  External examination of rectal area does not show any overt hemorrhoids.  Stool for occult blood was done in the ED and was negative.  Patient mentions that she had a colonoscopy when she was in [place] a few years ago.  Care everywhere was reviewed.  It looks like she was hospitalized in June 2017 for colitis.  She underwent flexible sigmoidoscopy which revealed inflamed mucosa in the proximal sigmoid.  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  No reports of colonoscopy noted.  In any case patient is not a candidate for elective endoscopy with her current respiratory status at this time.  History of spontaneous left pneumothorax in June 2021 Followed by pulmonology.  Currently x-ray does not show any pneumothorax.  Essential hypertension Patient noted to be on amlodipine which is being continued.  Dyslipidemia Continue with Lipitor  Tobacco abuse Continue with nicotine patch  History of depression Continue with Zoloft   DVT Prophylaxis: SCDs for now Code Status: Full code Family Communication: Discussed with the patient Disposition Plan:  Status is: Inpatient  Remains inpatient appropriate because:IV treatments appropriate due to intensity of illness or inability to take PO and Inpatient level of care appropriate due to severity of illness   Dispo: The patient is from: Home              Anticipated d/c is to: Home              Anticipated d/c date is: 3 days              Patient currently is not medically stable to d/c.      Medications:  Scheduled: . amLODipine  10 mg Oral Daily  . arformoterol  15 mcg Nebulization Q12H  . aspirin EC  81 mg Oral Daily  . atorvastatin  40 mg Oral Daily  . budesonide  0.5 mg Nebulization BID  . ipratropium-albuterol  3 mL Nebulization Q6H  . loratadine  10 mg Oral Daily  . methylPREDNISolone (SOLU-MEDROL) injection  40 mg Intravenous Q6H  . multivitamin  with minerals  1 tablet Oral Daily  . nicotine  21 mg Transdermal Daily  . pantoprazole  20 mg Oral Daily  . sertraline  100 mg Oral Daily   Continuous: . azithromycin    . cefTRIAXone (ROCEPHIN)  IV Stopped (08/02/19 0750)   VWU:JWJXBJYNW, cyclobenzaprine, guaiFENesin, hydrOXYzine, morphine CONCENTRATE   Objective:  Vital Signs  Vitals:   08/02/19 0823 08/02/19 0907 08/02/19 0930 08/02/19 1018  BP:  (!) 159/92 (!) 145/100 (!) 146/85  Pulse:  (!) 118 (!) 108 (!) 109  Resp:  (!) 26 (!) 23 (!) 31  Temp:  98.3 F (36.8 C)    TempSrc:  Oral    SpO2:  92% 92% 92%  Weight: 67.1 kg  Height:        Intake/Output Summary (Last 24 hours) at 08/02/2019 1103 Last data filed at 08/02/2019 0600 Gross per 24 hour  Intake 1810 ml  Output --  Net 1810 ml   Filed Weights   08/01/19 1652 08/02/19 0823  Weight: 64 kg 67.1 kg    General appearance: Awake alert.  In no distress Resp: Noted to be tachypneic.  Coarse breath sounds with crackles at the bases.  Air entry is poor diffusely.  Scattered wheezes. Cardio: S1-S2 is normal regular.  No S3-S4.  No rubs murmurs or bruit GI: Abdomen is soft.  Nontender nondistended.  Bowel sounds are present normal.  No masses organomegaly External examination of the rectal area does not show any hemorrhoids.  Digital examination was not performed as the patient became dyspneic. Extremities: No edema.  Full range of motion of lower extremities. Neurologic: Alert and oriented x3.  No focal neurological deficits.    Lab Results:  Data Reviewed: I have personally reviewed following labs and imaging studies  CBC: Recent Labs  Lab 08/01/19 1649 08/01/19 1749 08/02/19 0937  WBC 9.3 6.8  --   NEUTROABS 8.6*  --   --   HGB 6.3* 5.1* 12.8  HCT 20.9* 17.2* 41.3  MCV 94.6 96.1  --   PLT 175 142*  --     Basic Metabolic Panel: Recent Labs  Lab 08/01/19 1649  NA 141  K 4.8  CL 92*  CO2 39*  GLUCOSE 135*  BUN 18  CREATININE 0.58  CALCIUM  8.8*    GFR: Estimated Creatinine Clearance: 72.6 mL/min (by C-G formula based on SCr of 0.58 mg/dL).    Recent Results (from the past 240 hour(s))  Blood culture (routine x 2)     Status: None (Preliminary result)   Collection Time: 08/01/19  5:49 PM   Specimen: BLOOD  Result Value Ref Range Status   Specimen Description BLOOD RIGHT ANTECUBITAL  Final   Special Requests   Final    BOTTLES DRAWN AEROBIC AND ANAEROBIC Blood Culture adequate volume   Culture   Final    NO GROWTH < 12 HOURS Performed at [facility], [address] [place], [place] [address]    Report Status PENDING  Incomplete  Blood culture (routine x 2)     Status: None (Preliminary result)   Collection Time: 08/01/19  5:54 PM   Specimen: BLOOD  Result Value Ref Range Status   Specimen Description BLOOD LEFT ANTECUBITAL  Final   Special Requests   Final    BOTTLES DRAWN AEROBIC AND ANAEROBIC Blood Culture adequate volume   Culture   Final    NO GROWTH < 12 HOURS Performed at [facility], [address], [place], [place] [address]    Report Status PENDING  Incomplete  SARS Coronavirus 2 by RT PCR (hospital order, performed in [facility] hospital lab) Nasopharyngeal Nasopharyngeal Swab     Status: None   Collection Time: 08/01/19  6:30 PM   Specimen: Nasopharyngeal Swab  Result Value Ref Range Status   SARS Coronavirus 2 NEGATIVE NEGATIVE Final    Comment: (NOTE) SARS-CoV-2 target nucleic acids are NOT DETECTED.  The SARS-CoV-2 RNA is generally detectable in upper and lower respiratory specimens during the acute phase of infection. The lowest concentration of SARS-CoV-2 viral copies this assay can detect is 250 copies / mL. A negative result does not preclude SARS-CoV-2 infection and should not be used as the sole basis for treatment  or other patient management decisions.  A negative result may occur with improper specimen collection / handling, submission of specimen other than  nasopharyngeal swab, presence of viral mutation(s) within the areas targeted by this assay, and inadequate number of viral copies (<250 copies / mL). A negative result must be combined with clinical observations, patient history, and epidemiological information.  Fact Sheet for Patients:   BoilerBrush.com.cyhttps://www.fda.gov/media/136312/download  Fact Sheet for Healthcare Providers: https://pope.com/https://www.fda.gov/media/136313/download  This test is not yet approved or  cleared by the [place] FDA and has been authorized for detection and/or diagnosis of SARS-CoV-2 by FDA under an Emergency Use Authorization (EUA).  This EUA will remain in effect (meaning this test can be used) for the duration of the COVID-19 declaration under Section 564(b)(1) of the Act, 21 U.S.C. section 360bbb-3(b)(1), unless the authorization is terminated or revoked sooner.  Performed at [facility], [address] [place], [place] [address]       [facility]  Result Date: 08/02/2019 CLINICAL DATA:  Dyspnea. EXAM: PORTABLE CHEST 1 VIEW COMPARISON:  Chest x-ray August 01, 2019, chest CT July 11 2019 FINDINGS: Stable masses are identified in the left lung. The lungs are hyperinflated. The mediastinal contour and cardiac silhouette are stable. There is no pleural effusion or pulmonary edema. Bony structures are stable. IMPRESSION: No acute cardiopulmonary process identified. COPD. Stable masses of left lung. Electronically Signed   By: Sherian ReinWei-Chen  Lin M.D.   On: 08/02/2019 10:20   DG Chest Port 1 View  Result Date: 08/01/2019 CLINICAL DATA:  Shortness of breath. EXAM: PORTABLE CHEST 1 VIEW COMPARISON:  CT chest dated July 11, 2019. Chest x-ray dated July 10, 2019. FINDINGS: The heart size and mediastinal contours are within normal limits. The lungs remain hyperinflated with emphysematous changes. Increased reticulonodular densities in the right upper and lower lobes. Improved reticulonodular densities  in the left lower lobe. Unchanged two large smoothly marginated mass-like opacities in the left lung, previously demonstrated to reflect loculated pleural fluid in the major fissure. Previously seen non loculated small left pleural effusion has resolved. No pneumothorax. No acute osseous abnormality. IMPRESSION: 1. Worsening reticulonodular densities in the right upper and lower lobes concerning for pneumonia. Given interval worsening in the right lung with improvement in the left lower lobe, findings may reflect atypical infection. 2. Unchanged loculated pleural fluid in the left major fissure with resolved small non loculated left pleural effusion. Electronically Signed   By: Obie DredgeWilliam T Derry M.D.   On: 08/01/2019 17:35       LOS: 1 day   Teagyn Fishel  [facility] Pager on [email or web address]  08/02/2019, 11:03 AM

## 2019-08-02 NOTE — ED Notes (Signed)
RT called for ABG.

## 2019-08-02 NOTE — ED Notes (Signed)
Hemoccult neg

## 2019-08-02 NOTE — Plan of Care (Signed)

## 2019-08-02 NOTE — ED Notes (Signed)
Spoke with admitting MD about patients increased WOB.  Pursed lip breathing, slightly diaphoretic to forehead. New orders placed.

## 2019-08-02 NOTE — Progress Notes (Signed)
   08/02/19 1936  Assess: MEWS Score  Temp 98.9 F (37.2 C)  BP 137/80  Pulse Rate (!) 110  Resp (!) 23  Level of Consciousness Alert  SpO2 95 %  O2 Device Nasal Cannula  O2 Flow Rate (L/min) 3 L/min  Assess: MEWS Score  MEWS Temp 0  MEWS Systolic 0  MEWS Pulse 1  MEWS RR 1  MEWS LOC 0  MEWS Score 2  MEWS Score Color Yellow  Assess: if the MEWS score is Yellow or Red  Were vital signs taken at a resting state? Yes  Focused Assessment Documented focused assessment  Early Detection of Sepsis Score *See Row Information* High  MEWS guidelines implemented *See Row Information* Yes  Treat  MEWS Interventions Administered prn meds/treatments  Take Vital Signs  Increase Vital Sign Frequency  Yellow: Q 2hr X 2 then Q 4hr X 2, if remains yellow, continue Q 4hrs  Notify: Charge Nurse/RN  Name of Charge Nurse/RN Notified French Ana RN  Date Charge Nurse/RN Notified 08/02/19  Time Charge Nurse/RN Notified 1940  Notify: Provider  Provider Name/Title Webb Silversmith NP  Date Provider Notified 08/02/19  Time Provider Notified 1950  Notification Type Page  Notification Reason Other (Comment) (Mews yellow. Initiating protocol)  Response No new orders  Document  Progress note created (see row info) Yes

## 2019-08-02 NOTE — ED Notes (Signed)
Attempted to call report. Will return call.

## 2019-08-02 NOTE — Consult Note (Signed)
Reason for Consult: Acute on chronic respiratory failure with hypoxia and hypercarbia.  COPD exacerbation. Referring Physician: Osvaldo Shipper, MD Primary Pulmonologist: Lucie Leather, MD  Alicia Gibson is an 59 y.o. female. 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 on 7/8 with worsening shortness of breath and hypoxia.  Recall that the patient has had multiple admissions since 06/25/2019 initially for a spontaneous left pneumothorax requiring chest tube placement and then rehospitalization on 6/16 due to right lower lobe pneumonia.  She has been noted to have some "pseudotumors" due to loculated fluid in the fissure on the left lung residual from her pneumothorax.  The patient has had continuous decline since her admission on 1 June.  She recently was evaluated by Palliative Care in the home on 28 June.  She has been contemplating transitioning to hospice care.  On the day of presentation the patient was noted to be struggling to breathe with oxygen saturations of 45% on her home liter flow of oxygen.  EMS was activated and she was treated with increased flow of oxygen and albuterol.  She then became more responsive upon arrival to the ED.  Of note on this occasion the patient was noted to have significant anemia with a hemoglobin of 6.3 which on repeat was 5.1.  The patient has required transfusion.  She has had a prior history of colitis.  She has had some minor/streaky hemoptysis since her pneumonia episode.  On initial presentation it was noted that she had some nodular densities in the right upper and lower lobes that were concerning for pneumonia.  She received transfusion and Lasix after these interventions her chest x-ray now does not show these reticulonodular densities suspect that they represented asymmetrical pulmonary edema in a patient with very severe COPD and emphysema.  She has not  had any fevers, chills or sweats.  She continues to have brownish sputum production.  Class II diastolic dysfunction.  Patient voices no other complaint.  She does note that she uses morphine as needed at home for severe dyspnea.  Of note patient's last known FEV1 obtained 01/09/2018 on spirometry for disability determination FEV1 was 0.46 L or 16% of predicted.  She had bronchodilator response with FEV1 improving to 0.55 L or 19% of predicted.  FEV1/FVC was 35%.  This puts her in the very severe COPD category Gold class IV (D)  Past Medical History:  Diagnosis Date  . Allergy   . Anxiety   . Colitis    recurrent  . COPD (chronic obstructive pulmonary disease) (HCC)   . Depression   . Graves disease    s/p iodine radiation treatment - euthyroid for many years per pt  . Hyperlipidemia   . Hypertension   . Loss of eye, LEFT 2007    Past Surgical History:  Procedure Laterality Date  . ABDOMINAL HYSTERECTOMY    . FRACTURE SURGERY      Family History  Problem Relation Age of Onset  . Heart disease Father   . Alcohol abuse Father   . Alcohol abuse Brother   . Heart failure Sister   . Celiac disease Sister   . Healthy Sister    Social History   Tobacco Use  . Smoking status: Former Smoker    Packs/day: 1.0 X 30 + years    Types: Cigarettes  . Smokeless tobacco: Never Used  Substance Use Topics  . Alcohol use: Never  Allergies:  Allergies  Allergen Reactions  . Contrast Allergy Premed Pack [Prednisone & Diphenhydramine] Anaphylaxis    Patient states she had iv contrast in 2008 which caused her throat to close and difficulty breathing.   . Iodine Anaphylaxis  . Codeine Hives and Nausea And Vomiting  . Shellfish Allergy Hives  . Penicillins Hives and Rash    Medications:  I have reviewed the patient's current medications. Prior to Admission:  Medications Prior to Admission  Medication Sig Dispense Refill Last Dose  . albuterol (PROVENTIL) (2.5 MG/3ML) 0.083%  nebulizer solution Take 3 mLs (2.5 mg total) by nebulization 4 (four) times daily. 75 mL 3 07/31/2019 at 2130  . amLODipine (NORVASC) 10 MG tablet Take 1 tablet (10 mg total) by mouth daily. 30 tablet 0 07/31/2019 at 1000  . aspirin EC 81 MG tablet Take 81 mg by mouth daily.   07/31/2019 at 1000  . atorvastatin (LIPITOR) 40 MG tablet TAKE 1 TABLET(40 MG) BY MOUTH DAILY 30 tablet 2 07/31/2019 at 1000  . budesonide (PULMICORT) 0.5 MG/2ML nebulizer solution Take 2 mLs (0.5 mg total) by nebulization 2 (two) times daily. 1440 mL 10 07/31/2019 at 2100  . cetirizine (ZYRTEC) 10 MG tablet Take 1 tablet (10 mg total) by mouth daily. 30 tablet 5 07/31/2019 at 1000  . cyclobenzaprine (FLEXERIL) 10 MG tablet TAKE 1 TABLET BY MOUTH 2 TIMES DAILY AS NEEDED FOR MUSCLE SPASMS (Patient taking differently: Take 10 mg by mouth 2 (two) times daily as needed for muscle spasms. ) 60 tablet 5 07/31/2019 at 2100  . formoterol (PERFOROMIST) 20 MCG/2ML nebulizer solution Take 2 mLs (20 mcg total) by nebulization 2 (two) times daily. 1440 mL 10 07/31/2019 at 2100  . hydrOXYzine (ATARAX/VISTARIL) 25 MG tablet Take 1 tablet (25 mg total) by mouth 3 (three) times daily as needed for anxiety. 45 tablet 0 07/31/2019 at 2100  . Morphine Sulfate (MORPHINE CONCENTRATE) 10 MG/0.5ML SOLN concentrated solution Take 0.25 mLs (5 mg total) by mouth every 4 (four) hours as needed for severe pain or shortness of breath. 30 mL 0 08/01/2019 at 0000  . Multiple Vitamin (MULTIVITAMIN WITH MINERALS) TABS tablet Take 1 tablet by mouth daily.   07/31/2019 at 1000  . nicotine (NICODERM CQ - DOSED IN MG/24 HOURS) 21 mg/24hr patch Place 1 patch (21 mg total) onto the skin daily. 28 patch 0 07/31/2019 at 1100  . omeprazole (PRILOSEC OTC) 20 MG tablet Take 1 tablet (20 mg total) by mouth daily. 90 tablet 1 07/31/2019 at 1000  . predniSONE (DELTASONE) 10 MG tablet Prednisone dosing: Take  Prednisone 40mg  (4 tabs) x 3 days, then taper to 30mg  (3 tabs) x 3 days, then 20mg  (2 tabs) x  3days, then 10mg  (1 tab) x 3days, then OFF. 30 tablet 0 Past Week at Unknown time  . sertraline (ZOLOFT) 100 MG tablet Take 1 tablet (100 mg total) by mouth daily. 90 tablet 1 07/31/2019 at 1000  . albuterol (VENTOLIN HFA) 108 (90 Base) MCG/ACT inhaler INHALE 2 PUFFS INTO THE LUNGS EVERY 6 HOURS AS NEEDED FOR WHEEZING OR SHORTNESS OF BREATH (Patient taking differently: Inhale 2 puffs into the lungs every 6 (six) hours as needed for wheezing or shortness of breath. ) 25.5 g 0 prn at prn  . guaiFENesin (MUCINEX) 600 MG 12 hr tablet Take 2 tablets (1,200 mg total) by mouth 2 (two) times daily as needed for cough or to loosen phlegm. 120 tablet 2 prn at prn    Results for orders  placed or performed during the hospital encounter of 08/01/19 (from the past 48 hour(s))  CBC with Differential     Status: Abnormal   Collection Time: 08/01/19  4:49 PM  Result Value Ref Range   WBC 9.3 4.0 - 10.5 K/uL   RBC 2.21 (L) 3.87 - 5.11 MIL/uL   Hemoglobin 6.3 (L) 12.0 - 15.0 g/dL   HCT 16.120.9 (L) 36 - 46 %   MCV 94.6 80.0 - 100.0 fL   MCH 28.5 26.0 - 34.0 pg   MCHC 30.1 30.0 - 36.0 g/dL   RDW 09.614.3 04.511.5 - 40.915.5 %   Platelets 175 150 - 400 K/uL   nRBC 0.0 0.0 - 0.2 %   Neutrophils Relative % 93 %   Neutro Abs 8.6 (H) 1.7 - 7.7 K/uL   Lymphocytes Relative 4 %   Lymphs Abs 0.3 (L) 0.7 - 4.0 K/uL   Monocytes Relative 3 %   Monocytes Absolute 0.3 0 - 1 K/uL   Eosinophils Relative 0 %   Eosinophils Absolute 0.0 0 - 0 K/uL   Basophils Relative 0 %   Basophils Absolute 0.0 0 - 0 K/uL   Immature Granulocytes 0 %   Abs Immature Granulocytes 0.04 0.00 - 0.07 K/uL    Comment: Performed at [facility], [address] [place], [place] [address]  Basic metabolic panel     Status: Abnormal   Collection Time: 08/01/19  4:49 PM  Result Value Ref Range   Sodium 141 135 - 145 mmol/L   Potassium 4.8 3.5 - 5.1 mmol/L   Chloride 92 (L) 98 - 111 mmol/L   CO2 39 (H) 22 - 32 mmol/L   Glucose, Bld 135 (H) 70 - 99  mg/dL    Comment: Glucose reference range applies only to samples taken after fasting for at least 8 hours.   BUN 18 6 - 20 mg/dL   Creatinine, Ser 4.780.58 0.44 - 1.00 mg/dL   Calcium 8.8 (L) 8.9 - 10.3 mg/dL   GFR calc non Af Amer >60 >60 mL/min   GFR calc Af Amer >60 >60 mL/min   Anion gap 10 5 - 15    Comment: Performed at [facility], [address] [place], [place] [address]  Brain natriuretic peptide     Status: Abnormal   Collection Time: 08/01/19  4:49 PM  Result Value Ref Range   B Natriuretic Peptide 135.6 (H) 0.0 - 100.0 pg/mL    Comment: Performed at [facility], [address] [place], [place] [address]  Troponin I (High Sensitivity)     Status: Abnormal   Collection Time: 08/01/19  4:49 PM  Result Value Ref Range   Troponin I (High Sensitivity) 18 (H) <18 ng/L    Comment: (NOTE) Elevated high sensitivity troponin I (hsTnI) values and significant  changes across serial measurements may suggest ACS but many other  chronic and acute conditions are known to elevate hsTnI results.  Refer to the "Links" section for chest pain algorithms and additional  guidance. Performed at [facility], [address] [place], [place] [address]   Lactic acid, plasma     Status: None   Collection Time: 08/01/19  4:49 PM  Result Value Ref Range   Lactic Acid, Venous 0.6 0.5 - 1.9 mmol/L    Comment: Performed at [facility], [address] [place], [place] [address]  CBC     Status: Abnormal   Collection Time: 08/01/19  5:49 PM  Result  Value Ref Range   WBC 6.8 4.0 - 10.5 K/uL   RBC 1.79 (L) 3.87 - 5.11 MIL/uL   Hemoglobin 5.1 (L) 12.0 - 15.0 g/dL   HCT 26.9 (L) 36 - 46 %   MCV 96.1 80.0 - 100.0 fL   MCH 28.5 26.0 - 34.0 pg   MCHC 29.7 (L) 30.0 - 36.0 g/dL   RDW 48.5 46.2 - 70.3 %   Platelets 142 (L) 150 - 400 K/uL   nRBC 0.0 0.0 - 0.2 %    Comment: Performed at [facility], [address] [place], [place] [address]  Blood  culture (routine x 2)     Status: None (Preliminary result)   Collection Time: 08/01/19  5:49 PM   Specimen: BLOOD  Result Value Ref Range   Specimen Description BLOOD RIGHT ANTECUBITAL    Special Requests      BOTTLES DRAWN AEROBIC AND ANAEROBIC Blood Culture adequate volume   Culture      NO GROWTH < 12 HOURS Performed at [facility], [address], [place], [place] [address]    Report Status PENDING   ABO/Rh     Status: None   Collection Time: 08/01/19  5:49 PM  Result Value Ref Range   ABO/RH(D)      A POS Performed at [facility], [address] [place], [place] [address]   Blood culture (routine x 2)     Status: None (Preliminary result)   Collection Time: 08/01/19  5:54 PM   Specimen: BLOOD  Result Value Ref Range   Specimen Description BLOOD LEFT ANTECUBITAL    Special Requests      BOTTLES DRAWN AEROBIC AND ANAEROBIC Blood Culture adequate volume   Culture      NO GROWTH < 12 HOURS Performed at [facility], [address], [place], [place] [address]    Report Status PENDING   Type and screen     Status: None (Preliminary result)   Collection Time: 08/01/19  6:25 PM  Result Value Ref Range   ABO/RH(D) A POS    Antibody Screen NEG    Sample Expiration 08/04/2019,2359    Unit Number [id number]    Blood Component Type RED CELLS,LR    Unit division 00    Status of Unit ISSUED    Transfusion Status OK TO TRANSFUSE    Crossmatch Result Compatible    Unit Number [id number]    Blood Component Type RED CELLS,LR    Unit division 00    Status of Unit ISSUED,FINAL    Transfusion Status OK TO TRANSFUSE    Crossmatch Result      Compatible Performed at [facility], [address] [place], [place] [address]   SARS Coronavirus 2 by RT PCR (hospital order, performed in [facility] hospital lab) Nasopharyngeal Nasopharyngeal Swab     Status: None   Collection Time: 08/01/19  6:30 PM   Specimen: Nasopharyngeal Swab   Result Value Ref Range   SARS Coronavirus 2 NEGATIVE NEGATIVE    Comment: (NOTE) SARS-CoV-2 target nucleic acids are NOT DETECTED.  The SARS-CoV-2 RNA is generally detectable in upper and lower respiratory specimens during the acute phase of infection. The lowest concentration of SARS-CoV-2 viral copies this assay can detect is 250 copies / mL. A negative result does not preclude SARS-CoV-2 infection and should not be used as the sole basis for treatment or other patient management decisions.  A negative result may occur with improper specimen collection /  handling, submission of specimen other than nasopharyngeal swab, presence of viral mutation(s) within the areas targeted by this assay, and inadequate number of viral copies (<250 copies / mL). A negative result must be combined with clinical observations, patient history, and epidemiological information.  Fact Sheet for Patients:   BoilerBrush.com.cy  Fact Sheet for Healthcare Providers: https://pope.com/  This test is not yet approved or  cleared by the [place] FDA and has been authorized for detection and/or diagnosis of SARS-CoV-2 by FDA under an Emergency Use Authorization (EUA).  This EUA will remain in effect (meaning this test can be used) for the duration of the COVID-19 declaration under Section 564(b)(1) of the Act, 21 U.S.C. section 360bbb-3(b)(1), unless the authorization is terminated or revoked sooner.  Performed at [facility], [address] [place], [place] [address]   Prepare RBC (crossmatch)     Status: None   Collection Time: 08/01/19  8:00 PM  Result Value Ref Range   Order Confirmation      ORDER PROCESSED BY BLOOD BANK Performed at [facility], [address] [place], [place] [address]   Troponin I (High Sensitivity)     Status: None   Collection Time: 08/01/19 10:26 PM  Result Value Ref Range   Troponin I (High  Sensitivity) 17 <18 ng/L    Comment: (NOTE) Elevated high sensitivity troponin I (hsTnI) values and significant  changes across serial measurements may suggest ACS but many other  chronic and acute conditions are known to elevate hsTnI results.  Refer to the "Links" section for chest pain algorithms and additional  guidance. Performed at [facility], [address] [place], [place] [address]   HIV Antibody (routine testing w rflx)     Status: None   Collection Time: 08/01/19 10:26 PM  Result Value Ref Range   HIV Screen 4th Generation wRfx Non Reactive Non Reactive    Comment: Performed at [facility], [address], [place], [place] [address]  Hemoglobin and hematocrit, blood     Status: None   Collection Time: 08/02/19  9:37 AM  Result Value Ref Range   Hemoglobin 12.8 12.0 - 15.0 g/dL    Comment: REPEATED TO VERIFY   HCT 41.3 36 - 46 %    Comment: Performed at [facility], [address] [place], [place] [address]  Blood gas, arterial     Status: Abnormal   Collection Time: 08/02/19 10:02 AM  Result Value Ref Range   FIO2 0.36    Delivery systems NASAL CANNULA    pH, Arterial 7.51 (H) 7.35 - 7.45   pCO2 arterial 54 (H) 32 - 48 mmHg   pO2, Arterial 53 (L) 83 - 108 mmHg   Bicarbonate 43.1 (H) 20.0 - 28.0 mmol/L   Acid-Base Excess 17.3 (H) 0.0 - 2.0 mmol/L   O2 Saturation 90.3 %   Patient temperature 37.0    Collection site RIGHT BRACHIAL    Sample type ARTERIAL DRAW    Allens test (pass/fail) PASS PASS    Comment: Performed at [facility], [address], [place], [place] [address]    DG Chest Port 1 View  Result Date: 08/02/2019 CLINICAL DATA:  Dyspnea. EXAM: PORTABLE CHEST 1 VIEW COMPARISON:  Chest x-ray August 01, 2019, chest CT July 11 2019 FINDINGS: Stable masses are identified in the left lung. The lungs are hyperinflated. The mediastinal contour and cardiac silhouette are stable. There is no pleural effusion or pulmonary edema.  Bony  structures are stable. IMPRESSION: No acute cardiopulmonary process identified. COPD. Stable masses of left lung. Electronically Signed   By: Sherian Rein M.D.   On: 08/02/2019 10:20   DG Chest Port 1 View  Result Date: 08/01/2019 CLINICAL DATA:  Shortness of breath. EXAM: PORTABLE CHEST 1 VIEW COMPARISON:  CT chest dated July 11, 2019. Chest x-ray dated July 10, 2019. FINDINGS: The heart size and mediastinal contours are within normal limits. The lungs remain hyperinflated with emphysematous changes. Increased reticulonodular densities in the right upper and lower lobes. Improved reticulonodular densities in the left lower lobe. Unchanged two large smoothly marginated mass-like opacities in the left lung, previously demonstrated to reflect loculated pleural fluid in the major fissure. Previously seen non loculated small left pleural effusion has resolved. No pneumothorax. No acute osseous abnormality. IMPRESSION: 1. Worsening reticulonodular densities in the right upper and lower lobes concerning for pneumonia. Given interval worsening in the right lung with improvement in the left lower lobe, findings may reflect atypical infection. 2. Unchanged loculated pleural fluid in the left major fissure with resolved small non loculated left pleural effusion. Electronically Signed   By: Obie Dredge M.D.   On: 08/01/2019 17:35    Review of Systems  A 10 point review of systems was performed and it is as noted above otherwise negative.  Blood pressure (!) 147/78, pulse (!) 109, temperature 98.3 F (36.8 C), temperature source Oral, resp. rate (!) 25, height  (1.676 m), weight 67.1 kg, SpO2 92 %. Physical Exam GENERAL: Chronically ill-appearing, debilitated woman, chronic use of accessories, on nasal cannula O2. HEAD: Normocephalic, atraumatic.  Absent left eye. EYES: Pupils equal, round, reactive to light.  No scleral icterus.  MOUTH: Oral mucosa moist.  No thrush. NECK: Supple. No thyromegaly.  Trachea midline. No JVD.  No adenopathy. PULMONARY: Very distant breath sounds.  Poor air movement.  Faint end expiratory wheezing I to E ratio 1:5 CARDIOVASCULAR: S1 and S2. Regular rate and rhythm.  Cannot appreciate murmurs.  Distant heart tones. GASTROINTESTINAL: Protuberant abdomen, nondistended, soft, normoactive bowel sounds. MUSCULOSKELETAL: No joint deformity, no clubbing, no edema.  NEUROLOGIC: No overt focal deficit.  No asterixis.  Awake, alert, speech is fluent. SKIN: Intact,warm,dry.  No overt rashes. PSYCH: Mood and behavior normal.   Chest x-rays were reviewed independently.  Initial chest x-ray with reticulonodular densities right upper and left lower lobes and "pseudotumors".  Post diuresis follow-up film hyperinflation and no acute infiltrate, "masses" are "pseudotumors" i.e. loculated pleural effusion in the fissures.  This was demonstrated by prior CT chest 11 July 2019.  She had had prior spontaneous pneumothorax on the left with pleural effusion and this is residual pleural effusions.  This should resolve over time.  No evidence of active pneumonia.  Assessment/Plan:  Acute on chronic respiratory failure with hypoxia and hypercarbia COPD exacerbation Very severe COPD GOLD class IV (D) Continue bronchodilator therapy via nebulizer Agree with Brovana, Pulmicort and ipratropium Agree with IV steroids Patient does not have breath-holding capacity to use MDIs or DPI's effectively Currently do not feel that she has active infectious process "Wheezing" is actually due to dynamic airway collapse Management of dynamic airway collapse is with anxiolytics Pulmonary toilet with Acapella flutter valve Training in pursed lip breathing may also be of help  Continue oxygen supplementation to keep oxygen saturations between 88 to 92%  Loculated pleural effusions "Pseudotumors" due to loculated fluid in lung fissure Loculated fluid confirmed on CT chest 11 July 2019 Not amenable  to  drainage Will resolve over time  Severe anemia of uncertain etiology This will add to her sensation of dyspnea This issue adds complexity to her management Being worked up as able Patient poor candidate for invasive procedures Transfuse for hemoglobin < 7  Diastolic dysfunction grade II By prior echo 2019 through [facility] Diuretics as needed This issue adds complexity to her management  Goals of care discussion Patient has been evaluated by palliative Care in the outpatient setting She is on morphine at home for dyspnea, would resume MSIR 5 mg every 4 hours as needed may need to transition to MS Contin Patient is DNR/DNI after discussion today That has family coming in from out of town Considering transition to hospice care   Thank you for allowing [name] to participate in this patient's care.  Will see again as needed.   Gailen Shelter, MD [place] [facility] 08/02/2019, 2:03 PM

## 2019-08-02 NOTE — Progress Notes (Signed)
OT Cancellation Note  Patient Details Name: Alicia Gibson MRN: [id number] DOB: 20-Jan-1961   Cancelled Treatment:    Reason Eval/Treat Not Completed: Patient not medically ready. OT order received and chart reviewed. Per [facility], pt noted to have Hgb 5.1 this AM. Per therapy guidelines, will hold OT evaluation until pt medically appropriate for physical activity.    Kathie Dike, M.S. OTR/L  08/02/19, 8:44 AM

## 2019-08-02 NOTE — ED Notes (Signed)
Requested pulmicort from pharmacy.  Pt noted to have increased WOB.

## 2019-08-02 NOTE — ED Notes (Signed)
WOB has improved, no longer pursed lip breathing. Skin warm and dry.  Pt feels better than prior.  Lights dimmed for pt comfort. purewick working [employer]

## 2019-08-02 NOTE — Progress Notes (Signed)
PT Cancellation Note  Patient Details Name: Alicia Gibson MRN: [id number] DOB: [birth year]   Cancelled Treatment:    Reason Eval/Treat Not Completed: Medical issues which prohibited therapy (Consult received and chart reviewed. Patient noted with current HgB 5.1; contraindicated for exertional activity at this time.  Will hold eval and re-attempt next date as medically appropriate and available.)   Betsi Crespi H. Manson Passey, PT, DPT, NCS 08/02/19, 9:40 AM [phone]

## 2019-08-02 NOTE — Progress Notes (Signed)
Initial Nutrition Assessment  DOCUMENTATION CODES:   Not applicable  INTERVENTION:   Ensure Enlive po BID, each supplement provides 350 kcal and 20 grams of protein  MVI daily   NUTRITION DIAGNOSIS:   Increased nutrient needs related to catabolic illness (COPD) as evidenced by increased estimated needs.  GOAL:   Patient will meet greater than or equal to 90% of their needs  MONITOR:   PO intake, Supplement acceptance, Labs, Weight trends, Skin, I & O's  REASON FOR ASSESSMENT:   Consult Assessment of nutrition requirement/status  ASSESSMENT:   59 y.o. female with medical history significant for HTN, depression/anxiety, end-stage COPD with chronic respiratory failure on home O2 at 3 L, followed by pulmonologist, Dr. Mortimer Fries, with recent history on 06/25/2019 of spontaneous left pneumothorax requiring chest tube placement, with rehospitalization again from 6/16 to 07/13/2019 with worsening respiratory failure who returns to the ER with worsening shortness of breath and hypoxia.   Met with pt in room today. Pt reports good appetite and oral intake at baseline but reports that her appetite is poor today r/t nausea. Pt did not touch her lunch tray today and reports that she did not have anything for breakfast because she was NPO. Pt does enjoy chocolate Ensure and is willing to drink these in hospital (of note, pt also enjoys butter pecan flavor). Per chart, pt with weight gain pta.   Medications reviewed and include: aspirin, solu-medrol, MVI, nicotine, protonix, azithromycin, ceftriaxone   Labs reviewed:   NUTRITION - FOCUSED PHYSICAL EXAM:    Most Recent Value  Orbital Region No depletion  Upper Arm Region No depletion  Thoracic and Lumbar Region No depletion  Buccal Region No depletion  Temple Region Mild depletion  Clavicle Bone Region Mild depletion  Clavicle and Acromion Bone Region Mild depletion  Scapular Bone Region Mild depletion  Dorsal Hand Severe depletion   Patellar Region Severe depletion  Anterior Thigh Region Severe depletion  Posterior Calf Region Severe depletion  Edema (RD Assessment) None  Hair Reviewed  Eyes Reviewed  Mouth Reviewed  Skin Reviewed  Nails Reviewed     Diet Order:   Diet Order            Diet 2 gram sodium Room service appropriate? Yes; Fluid consistency: Thin  Diet effective now                EDUCATION NEEDS:   Education needs have been addressed  Skin:  Skin Assessment: Reviewed RN Assessment (ecchymosis)  Last BM:  7/9- type 2  Height:   Ht Readings from Last 1 Encounters:  08/01/19 5' 6" (1.676 m)    Weight:   Wt Readings from Last 1 Encounters:  08/02/19 67.1 kg    Ideal Body Weight:  59 kg  BMI:  Body mass index is 23.89 kg/m.  Estimated Nutritional Needs:   Kcal:  1700-1900kcal/day  Protein:  85-95g/day  Fluid:  1.7-2L/day  Koleen Distance MS, RD, LDN Please refer to [facility] for RD and/or RD on-call/weekend/after hours pager

## 2019-08-03 DIAGNOSIS — J9621 Acute and chronic respiratory failure with hypoxia: Secondary | ICD-10-CM | POA: Diagnosis not present

## 2019-08-03 DIAGNOSIS — D649 Anemia, unspecified: Secondary | ICD-10-CM | POA: Diagnosis not present

## 2019-08-03 DIAGNOSIS — J189 Pneumonia, unspecified organism: Secondary | ICD-10-CM | POA: Diagnosis not present

## 2019-08-03 DIAGNOSIS — J441 Chronic obstructive pulmonary disease with (acute) exacerbation: Secondary | ICD-10-CM | POA: Diagnosis not present

## 2019-08-03 LAB — TYPE AND SCREEN
ABO/RH(D): A POS
Antibody Screen: NEGATIVE
Unit division: 0
Unit division: 0

## 2019-08-03 LAB — CBC
HCT: 38.8 % (ref 36.0–46.0)
Hemoglobin: 12.4 g/dL (ref 12.0–15.0)
MCH: 27.3 pg (ref 26.0–34.0)
MCHC: 32 g/dL (ref 30.0–36.0)
MCV: 85.5 fL (ref 80.0–100.0)
Platelets: 301 10*3/uL (ref 150–400)
RBC: 4.54 MIL/uL (ref 3.87–5.11)
RDW: 15.1 % (ref 11.5–15.5)
WBC: 11.9 10*3/uL — ABNORMAL HIGH (ref 4.0–10.5)
nRBC: 0 % (ref 0.0–0.2)

## 2019-08-03 LAB — BASIC METABOLIC PANEL
Anion gap: 12 (ref 5–15)
BUN: 19 mg/dL (ref 6–20)
CO2: 33 mmol/L — ABNORMAL HIGH (ref 22–32)
Calcium: 9.1 mg/dL (ref 8.9–10.3)
Chloride: 95 mmol/L — ABNORMAL LOW (ref 98–111)
Creatinine, Ser: 0.59 mg/dL (ref 0.44–1.00)
GFR calc Af Amer: >60 mL/min (ref >60)
GFR calc non Af Amer: >60 mL/min (ref >60)
Glucose, Bld: 146 mg/dL — ABNORMAL HIGH (ref 70–99)
Potassium: 3.6 mmol/L (ref 3.5–5.1)
Sodium: 140 mmol/L (ref 135–145)

## 2019-08-03 LAB — BPAM RBC
Blood Product Expiration Date: 202108012359
Blood Product Expiration Date: 202108012359
ISSUE DATE / TIME: 202107082108
ISSUE DATE / TIME: 202107090544
Unit Type and Rh: 6200
Unit Type and Rh: 6200

## 2019-08-03 MED ORDER — FUROSEMIDE 10 MG/ML IJ SOLN
40.0000 mg | Freq: Once | INTRAMUSCULAR | Status: AC
  Administered 2019-08-03: 40 mg via INTRAVENOUS
  Filled 2019-08-03: qty 4

## 2019-08-03 MED ORDER — POTASSIUM CHLORIDE CRYS ER 20 MEQ PO TBCR
40.0000 meq | EXTENDED_RELEASE_TABLET | Freq: Once | ORAL | Status: AC
  Administered 2019-08-03: 40 meq via ORAL
  Filled 2019-08-03: qty 2

## 2019-08-03 NOTE — Progress Notes (Signed)
Report received and care assumed from [facility]. Per report patient in MEWS 2 all day. No changes. NADN Resting in bed with call bell in reach. Will continue to monitor

## 2019-08-03 NOTE — Progress Notes (Signed)
[facility] PROGRESS NOTE   Alicia Gibson DPO:[id number] DOB: 05-13-1960 DOA: 08/01/2019  PCP: [name], FNP  Brief History/Interval Summary: 59 y.o. female with medical history significant for HTN, depression/anxiety, end-stage COPD with chronic respiratory failure on home O2 at 3 L, followed by pulmonologist, [name], with recent history on 06/25/2019 of spontaneous left pneumothorax requiring chest tube placement, with rehospitalization again from 6/16 to 07/13/2019 with worsening respiratory failure,who returns to the ER now with worsening shortness of breath and hypoxia.  Her roommate apparently went home early as she was not answering the phone and found her to be in bed struggling to breathe with O2 sats 45% on her home flow rate, improving to 85% with O2 up to 4 L.  EMS administered albuterol in route.    She was hospitalized for further management.  Reason for Visit: Acute respiratory failure with hypoxia.  COPD exacerbation.  Anemia  Consultants: Pulmonology  Procedures: Transfusion of 2 units of PRBC  Antibiotics: Anti-infectives (From admission, onward)   Start     Dose/Rate Route Frequency Ordered Stop   08/01/19 1800  cefTRIAXone (ROCEPHIN) 1 g in sodium chloride 0.9 % 100 mL IVPB        1 g 200 mL/hr over 30 Minutes Intravenous  Once 08/01/19 1749 08/01/19 2012   08/01/19 1800  azithromycin (ZITHROMAX) 500 mg in sodium chloride 0.9 % 250 mL IVPB        500 mg 250 mL/hr over 60 Minutes Intravenous  Once 08/01/19 1749 08/01/19 2120   08/01/19 1800  azithromycin (ZITHROMAX) 500 mg in sodium chloride 0.9 % 250 mL IVPB     Discontinue     500 mg 250 mL/hr over 60 Minutes Intravenous Every 24 hours 08/01/19 2056 08/06/19 1759   08/01/19 0800  cefTRIAXone (ROCEPHIN) 2 g in sodium chloride 0.9 % 100 mL IVPB     Discontinue     2 g 200 mL/hr over 30 Minutes Intravenous Every 24 hours 08/01/19 2056 08/06/19 0759      Subjective/Interval History: Patient mentions  that she is feeling slightly better compared to yesterday.  Still not back to her baseline.  Continues to have a dry cough.  No chest pain.  No other complaints offered.     Assessment/Plan:  Acute on chronic respiratory failure with hypoxia Patient initially came in saturating in the mid 80s.  She uses oxygen at home at 3 L/min.  Chest x-ray showed suggested right-sided pneumonia.  She has 2 areas in her left lung which have previously been demonstrated as loculated fluid collection. Patient was to follow up with pulmonology, [name].   ABG was done which shows a pH of 7.5 and a PCO2 of 53.  She is likely a chronic CO2 retainer.  Pulmonology was consulted.  Patient remains on antibacterials.  She is also on nebulizer treatments and steroids.   Seems to be slightly better today compared to yesterday.  Continue to monitor.  Give additional dose of furosemide.  Supplement potassium.   Community-acquired pneumonia She has pneumonia involving the right lung.  On ceftriaxone and azithromycin.  Continue.  Follow-up on cultures.    History of COPD with acute exacerbation Patient appears to have advanced COPD.  Continue with her home inhaler regimen.  Antibiotics as discussed above.  Nebulized bronchodilators.  She is also on systemic steroids.  Also on nebulized budesonide.  Pulmonology has seen the patient.    Normocytic anemia Hemoglobin noted to be 6.3.  Hemoglobin  was 10.4 in June.  It was repeated and was noted to be 5.1.  Patient was ordered 2 units of PRBC.  Lasix was given as discussed above.  Repeat hemoglobin after transfusion came out to be 12.8.  Unclear as to the reason for this discrepancy.   Hemoglobin again noted to be 12 this morning.  Its likely that the initial readings may have been erroneous.   Patient did mention occasional blood in stool.  External examination of rectal area does not show any overt hemorrhoids.  Stool for occult blood was done in the ED and was negative.  Patient  mentions that she had a colonoscopy when she was in [place] a few years ago.  Care everywhere was reviewed.  It looks like she was hospitalized in June 2017 for colitis.  She underwent flexible sigmoidoscopy which revealed inflamed mucosa in the proximal sigmoid.  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  No reports of colonoscopy noted.  In any case patient is not a candidate for elective endoscopy with her current respiratory status at this time. No further work-up at this time.  History of spontaneous left pneumothorax in June 2021 Followed by pulmonology.  Currently x-ray does not show any pneumothorax.  Essential hypertension Patient noted to be on amlodipine which is being continued.  Occasional high readings noted.  Stable for the most part.  Dyslipidemia Continue with Lipitor  Tobacco abuse Continue with nicotine patch  History of depression Continue with Zoloft   DVT Prophylaxis: SCDs  Code Status: Full code Family Communication: Discussed with the patient Disposition Plan: Status is: Inpatient  Remains inpatient appropriate because:IV treatments appropriate due to intensity of illness or inability to take PO   Dispo:  Patient From: Home  Planned Disposition: Home  Expected discharge date: 08/05/19  Medically stable for discharge: No      Medications:  Scheduled: . amLODipine  10 mg Oral Daily  . arformoterol  15 mcg Nebulization Q12H  . aspirin EC  81 mg Oral Daily  . atorvastatin  40 mg Oral Daily  . budesonide  0.5 mg Nebulization BID  . feeding supplement (ENSURE ENLIVE)  237 mL Oral TID BM  . ipratropium  0.5 mg Nebulization Q6H  . levalbuterol  0.63 mg Nebulization Q6H  . loratadine  10 mg Oral Daily  . methylPREDNISolone (SOLU-MEDROL) injection  40 mg Intravenous Q6H  . multivitamin with minerals  1 tablet Oral Daily  . nicotine  21 mg Transdermal Daily  . pantoprazole  20 mg Oral Daily  . sertraline  100 mg Oral  Daily   Continuous: . azithromycin Stopped (08/02/19 2243)  . cefTRIAXone (ROCEPHIN)  IV 2 g (08/03/19 0800)   NTZ:GYFVCBSWHQPRFFM, guaiFENesin, hydrOXYzine, levalbuterol, morphine CONCENTRATE   Objective:  Vital Signs  Vitals:   08/03/19 0206 08/03/19 0345 08/03/19 0401 08/03/19 0740  BP:  137/80  (!) 156/91  Pulse:  (!) 103  97  Resp: (!) 21 18  18   Temp:  98.2 F (36.8 C)  98.5 F (36.9 C)  TempSrc:  Oral  Oral  SpO2:  97%  97%  Weight:   64.9 kg   Height:        Intake/Output Summary (Last 24 hours) at 08/03/2019 0945 Last data filed at 08/03/2019 0402 Gross per 24 hour  Intake 470 ml  Output 2600 ml  Net -2130 ml   Filed Weights   08/01/19 1652 08/02/19 0823 08/03/19 0401  Weight: 64 kg  67.1 kg 64.9 kg   General appearance: Awake alert.  In no distress Resp: Improved effort though still tachypneic.  Coarse breath sounds bilaterally with wheezing.  Slightly better air entry today compared to yesterday.  Cardio: S1-S2 is normal regular.  No S3-S4.  No rubs murmurs or bruit GI: Abdomen is soft.  Nontender nondistended.  Bowel sounds are present normal.  No masses organomegaly Extremities: No edema.  Full range of motion of lower extremities. Neurologic: Alert and oriented x3.  No focal neurological deficits.    Lab Results:  Data Reviewed: I have personally reviewed following labs and imaging studies  CBC: Recent Labs  Lab 08/01/19 1649 08/01/19 1749 08/02/19 0937 08/03/19 0618  WBC 9.3 6.8  --  11.9*  NEUTROABS 8.6*  --   --   --   HGB 6.3* 5.1* 12.8 12.4  HCT 20.9* 17.2* 41.3 38.8  MCV 94.6 96.1  --  85.5  PLT 175 142*  --  301    Basic Metabolic Panel: Recent Labs  Lab 08/01/19 1649 08/03/19 0432  NA 141 140  K 4.8 3.6  CL 92* 95*  CO2 39* 33*  GLUCOSE 135* 146*  BUN 18 19  CREATININE 0.58 0.59  CALCIUM 8.8* 9.1    GFR: Estimated Creatinine Clearance: 72.6 mL/min (by C-G formula based on SCr of 0.59 mg/dL).    Recent Results  (from the past 240 hour(s))  Blood culture (routine x 2)     Status: None (Preliminary result)   Collection Time: 08/01/19  5:49 PM   Specimen: BLOOD  Result Value Ref Range Status   Specimen Description BLOOD RIGHT ANTECUBITAL  Final   Special Requests   Final    BOTTLES DRAWN AEROBIC AND ANAEROBIC Blood Culture adequate volume   Culture   Final    NO GROWTH 2 DAYS Performed at [facility], [address] [place], [place] [address]    Report Status PENDING  Incomplete  Blood culture (routine x 2)     Status: None (Preliminary result)   Collection Time: 08/01/19  5:54 PM   Specimen: BLOOD  Result Value Ref Range Status   Specimen Description BLOOD LEFT ANTECUBITAL  Final   Special Requests   Final    BOTTLES DRAWN AEROBIC AND ANAEROBIC Blood Culture adequate volume   Culture   Final    NO GROWTH 2 DAYS Performed at [facility], [address] [place], [place] [address]    Report Status PENDING  Incomplete  SARS Coronavirus 2 by RT PCR (hospital order, performed in [facility] hospital lab) Nasopharyngeal Nasopharyngeal Swab     Status: None   Collection Time: 08/01/19  6:30 PM   Specimen: Nasopharyngeal Swab  Result Value Ref Range Status   SARS Coronavirus 2 NEGATIVE NEGATIVE Final    Comment: (NOTE) SARS-CoV-2 target nucleic acids are NOT DETECTED.  The SARS-CoV-2 RNA is generally detectable in upper and lower respiratory specimens during the acute phase of infection. The lowest concentration of SARS-CoV-2 viral copies this assay can detect is 250 copies / mL. A negative result does not preclude SARS-CoV-2 infection and should not be used as the sole basis for treatment or other patient management decisions.  A negative result may occur with improper specimen collection / handling, submission of specimen other than nasopharyngeal swab, presence of viral mutation(s) within the areas targeted by this assay, and inadequate number of viral copies (<250  copies / mL). A negative result must be combined with clinical  observations, patient history, and epidemiological information.  Fact Sheet for Patients:   BoilerBrush.com.cy  Fact Sheet for Healthcare Providers: https://pope.com/  This test is not yet approved or  cleared by the [place] FDA and has been authorized for detection and/or diagnosis of SARS-CoV-2 by FDA under an Emergency Use Authorization (EUA).  This EUA will remain in effect (meaning this test can be used) for the duration of the COVID-19 declaration under Section 564(b)(1) of the Act, 21 U.S.C. section 360bbb-3(b)(1), unless the authorization is terminated or revoked sooner.  Performed at [facility], [address], [place], [place] [address]       Radiology Studies: [facility] [place] 1 View  Result Date: 08/02/2019 CLINICAL DATA:  Dyspnea. EXAM: PORTABLE CHEST 1 VIEW COMPARISON:  Chest x-ray August 01, 2019, chest CT July 11 2019 FINDINGS: Stable masses are identified in the left lung. The lungs are hyperinflated. The mediastinal contour and cardiac silhouette are stable. There is no pleural effusion or pulmonary edema. Bony structures are stable. IMPRESSION: No acute cardiopulmonary process identified. COPD. Stable masses of left lung. Electronically Signed   By: Sherian Rein M.D.   On: 08/02/2019 10:20   DG Chest Port 1 View  Result Date: 08/01/2019 CLINICAL DATA:  Shortness of breath. EXAM: PORTABLE CHEST 1 VIEW COMPARISON:  CT chest dated July 11, 2019. Chest x-ray dated July 10, 2019. FINDINGS: The heart size and mediastinal contours are within normal limits. The lungs remain hyperinflated with emphysematous changes. Increased reticulonodular densities in the right upper and lower lobes. Improved reticulonodular densities in the left lower lobe. Unchanged two large smoothly marginated mass-like opacities in the left lung, previously demonstrated to reflect  loculated pleural fluid in the major fissure. Previously seen non loculated small left pleural effusion has resolved. No pneumothorax. No acute osseous abnormality. IMPRESSION: 1. Worsening reticulonodular densities in the right upper and lower lobes concerning for pneumonia. Given interval worsening in the right lung with improvement in the left lower lobe, findings may reflect atypical infection. 2. Unchanged loculated pleural fluid in the left major fissure with resolved small non loculated left pleural effusion. Electronically Signed   By: Obie Dredge M.D.   On: 08/01/2019 17:35       LOS: 2 days   Caitlyne Ingham Rito Ehrlich  [facility] Pager on [email or web address]  08/03/2019, 9:45 AM

## 2019-08-03 NOTE — Evaluation (Signed)
Occupational Therapy Evaluation Patient Details Name: Alicia Gibson MRN: [id number] DOB: [birth year] Today's Date: 08/03/2019    History of Present Illness presented to ER secondary to SOB, respiratory distress; admitted for management of acute/chronic respiratory failure with hypoxia related to COPD exacerbation, R lobe PNA and symptomatic anemia.   Clinical Impression   Pt was seen for OT evaluation this date. Prior to hospital admission, pt was MOD I with ADLs and ADL mobility. Pt lives with a roommate in [facility] with 4 STE. Currently pt demonstrates impairments as described below (See OT problem list) which functionally limit her ability to perform ADL/self-care tasks. Pt currently requires SBA/Supv with LB ADLs d/t more dynamic nature of task and decreased fxl activity tolerance. Pt requires SBA with ADL transfers and mobility and demos decreased standing tolerance for fxl task performance. Pt endorses feeling she has gotten weaker in last month or so.  Pt would benefit from skilled OT to address noted impairments and functional limitations (see below for any additional details) in order to maximize safety and independence while minimizing falls risk and caregiver burden. Upon hospital discharge, recommend HHOT to maximize pt safety and return to functional independence during meaningful occupations of daily life.     Follow Up Recommendations  Home health OT    Equipment Recommendations  Tub/shower seat (recommend shower stool and grab bar)    Recommendations for Other Services       Precautions / Restrictions Precautions Precautions: Fall Restrictions Weight Bearing Restrictions: No      Mobility Bed Mobility Overal bed mobility: Modified Independent Bed Mobility: Supine to Sit     Supine to sit: Modified independent (Device/Increase time)        Transfers Overall transfer level: Modified independent Equipment used: Rolling walker (2 wheeled) Transfers: Sit to/from  Stand Sit to Stand: Modified independent (Device/Increase time) Stand pivot transfers: Modified independent (Device/Increase time)            Balance Overall balance assessment: Needs assistance Sitting-balance support: Feet supported;No upper extremity supported Sitting balance-Leahy Scale: Good     Standing balance support: Bilateral upper extremity supported Standing balance-Leahy Scale: Fair Standing balance comment: UE support req'ed                           ADL either performed or assessed with clinical judgement   ADL Overall ADL's : Needs assistance/impaired                                       General ADL Comments: SBA toileting and ADL transfers. Cues for PLB and initiating RB when demo'ing fatigue/SOB. Performs LB dressing with increased time while seated EOB.     Vision Patient Visual Report: No change from baseline       Perception     Praxis      Pertinent Vitals/Pain Pain Assessment: No/denies pain (vauge c/o headache, but no other pain impacting fxn)     Hand Dominance Right   Extremity/Trunk Assessment Upper Extremity Assessment Upper Extremity Assessment: Overall WFL for tasks assessed   Lower Extremity Assessment Lower Extremity Assessment: Overall WFL for tasks assessed       Communication Communication Communication: No difficulties   Cognition Arousal/Alertness: Awake/alert Behavior During Therapy: WFL for tasks assessed/performed Overall Cognitive Status: Within Functional Limits for tasks assessed  General Comments       Exercises Other Exercises Other Exercises: OT facilitates education re: potential benefits of OT f/u in [facility] setting and pt very interesting in continuing therapy in this capacity.   Shoulder Instructions      Home Living Family/patient expects to be discharged to:: Private residence Living Arrangements: Other  relatives;Non-relatives/Friends Available Help at Discharge: Friend(s);Available PRN/intermittently Type of Home: House Home Access: Ramped entrance;Stairs to enter Entrance Stairs-Number of Steps: 4   Home Layout: One level     Bathroom Shower/Tub: Chief Strategy Officer: Standard     Home Equipment: Grab bars - tub/shower;Walker - 2 wheels   Additional Comments: usees an outdoor chair in the shower as a shower seat as the tub/shower combo is too small to fit a shower chair or BSC.      Prior Functioning/Environment Level of Independence: Needs assistance  Gait / Transfers Assistance Needed: household mobility without assist device ("furniture cruises"); intermittent use of RW, but limited due to size/space in house. ADL's / Homemaking Assistance Needed: Mod indep for ADLs including use of seat in shower. Limited standing tolerance so her roommate performs most IADLs including cooking most meals and cleaning.   Comments: Denies fall history. Home O2 at 3L. Does not drive. 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)        OT Problem List: Decreased strength;Impaired balance (sitting and/or standing);Decreased activity tolerance;Decreased range of motion;Cardiopulmonary status limiting activity      OT Treatment/Interventions: Self-care/ADL training;Therapeutic exercise;Energy conservation;DME and/or AE instruction;Therapeutic activities;Visual/perceptual remediation/compensation;Patient/family education;Balance training    OT Goals(Current goals can be found in the care plan section) Acute Rehab OT Goals Patient Stated Goal: to return home OT Goal Formulation: With patient Time For Goal Achievement: 08/17/19 Potential to Achieve Goals: Good  OT Frequency: Min 1X/week   Barriers to D/C: Inaccessible home environment;Decreased caregiver support          Co-evaluation              AM-PAC OT "6 Clicks" Daily Activity      Outcome Measure Help from another person eating meals?: None Help from another person taking care of personal grooming?: None Help from another person toileting, which includes using toliet, bedpan, or urinal?: A Little Help from another person bathing (including washing, rinsing, drying)?: A Little Help from another person to put on and taking off regular upper body clothing?: None Help from another person to put on and taking off regular lower body clothing?: A Little 6 Click Score: 21   End of Session Equipment Utilized During Treatment: Oxygen  Activity Tolerance: Patient tolerated treatment well Patient left: in bed;with call bell/phone within reach  OT Visit Diagnosis: Other abnormalities of gait and mobility (R26.89)                Time: 7829-5621 OT Time Calculation (min): 23 min Charges:  OT General Charges $OT Visit: 1 Visit OT Evaluation $OT Eval Moderate Complexity: 1 Mod OT Treatments $Self Care/Home Management : 8-22 mins  Rejeana Brock, MS, OTR/L ascom [phone] 08/03/19, 4:51 PM

## 2019-08-03 NOTE — Evaluation (Signed)
Physical Therapy Evaluation Patient Details Name: Alicia Gibson MRN: [id number] DOB: [birth year] Today's Date: 08/03/2019   History of Present Illness  presented to ER secondary to SOB, respiratory distress; admitted for management of acute/chronic respiratory failure with hypoxia related to COPD exacerbation, R lobe PNA and symptomatic anemia.  Clinical Impression  Upon evaluation, patient alert and oriented; follows commands, agreeable to session.  Continues to endorse mild SOB, but improved since admission.  Bilat UE/LE strength and ROM grossly symmetrical and WFL; no focal weakness appreciated.  Able to complete sit/stand, basic transfers and gait (60') with RW, close sup/mod indep. Demonstrates reciprocal stepping pattern, fair step height/length; slow and guarded performance, 3-4 standing rest periods required to complete distance (due to SOB).  Sats >95% on 4L throughout.  Would benefit from continued education re: activity pacing, energy conservation with mobility tasks. Would benefit from skilled PT to address above deficits and promote optimal return to PLOF.; Recommend transition to HHPT upon discharge from acute hospitalization.  Will continue to monitor and update as appropriate.  Would benefit from referral to outpatient pulmonary rehab services post-PT.     Follow Up Recommendations Home health PT    Equipment Recommendations       Recommendations for Other Services       Precautions / Restrictions Precautions Precautions: Fall Restrictions Weight Bearing Restrictions: No      Mobility  Bed Mobility               General bed mobility comments: seated in recliner beginning/end of treatment session  Transfers Overall transfer level: Modified independent Equipment used: Rolling walker (2 wheeled) Transfers: Sit to/from Stand Sit to Stand: Modified independent (Device/Increase time)         General transfer comment: good LE  strength/stability  Ambulation/Gait Ambulation/Gait assistance: Supervision Gait Distance (Feet): 60 Feet Assistive device: Rolling walker (2 wheeled)       General Gait Details: reciprocal stepping pattern, fair step height/length; slow and guarded performance, 3-4 standing rest periods required to complete distance (due to SOB).  Sats >95% on 4L throughout  Stairs            Wheelchair Mobility    Modified Rankin (Stroke Patients Only)       Balance Overall balance assessment: Needs assistance Sitting-balance support: Feet supported;No upper extremity supported Sitting balance-Leahy Scale: Good     Standing balance support: Bilateral upper extremity supported Standing balance-Leahy Scale: Fair                               Pertinent Vitals/Pain Pain Assessment: No/denies pain    Home Living Family/patient expects to be discharged to:: Private residence Living Arrangements: Other relatives;Non-relatives/Friends Available Help at Discharge: Friend(s);Available PRN/intermittently Type of Home: House Home Access: Ramped entrance;Stairs to enter   Entrance Stairs-Number of Steps: 4 Home Layout: One level Home Equipment: Grab bars - tub/shower;Walker - 2 wheels      Prior Function Level of Independence: Independent with assistive device(s)         Comments: Mod indep for ADLs, household mobility without assist device ("furniture cruises"); intermittent use of RW, but limited due to size/space in house.  Denies fall history. Home O2 at 3L     Hand Dominance        Extremity/Trunk Assessment   Upper Extremity Assessment Upper Extremity Assessment: Overall WFL for tasks assessed    Lower Extremity Assessment Lower Extremity Assessment: Overall WFL for tasks  assessed (grossly at least 4/5 throughout)       Communication   Communication: No difficulties  Cognition Arousal/Alertness: Awake/alert Behavior During Therapy: WFL for tasks  assessed/performed Overall Cognitive Status: Within Functional Limits for tasks assessed                                        General Comments      Exercises Other Exercises Other Exercises: Educated re: role of PT and progressive mobility in acute setting; reviewed pursed lip breathing, activity pacing/energy conservation, use of RW as needed. Patient voiced understanding; will reinforce as needed.   Assessment/Plan    PT Assessment Patient needs continued PT services  PT Problem List Decreased activity tolerance;Decreased balance;Decreased mobility;Cardiopulmonary status limiting activity       PT Treatment Interventions DME instruction;Gait training;Stair training;Functional mobility training;Therapeutic activities;Therapeutic exercise;Balance training;Patient/family education;Cognitive remediation    PT Goals (Current goals can be found in the Care Plan section)  Acute Rehab PT Goals Patient Stated Goal: to return home PT Goal Formulation: With patient Time For Goal Achievement: 08/17/19 Potential to Achieve Goals: Fair    Frequency Min 2X/week   Barriers to discharge        Co-evaluation               AM-PAC PT "6 Clicks" Mobility  Outcome Measure Help needed turning from your back to your side while in a flat bed without using bedrails?: None Help needed moving from lying on your back to sitting on the side of a flat bed without using bedrails?: None Help needed moving to and from a bed to a chair (including a wheelchair)?: None Help needed standing up from a chair using your arms (e.g., wheelchair or bedside chair)?: None Help needed to walk in hospital room?: None Help needed climbing 3-5 steps with a railing? : A Little 6 Click Score: 23    End of Session Equipment Utilized During Treatment: Gait belt Activity Tolerance: Patient tolerated treatment well Patient left: in chair;with call bell/phone within reach (per RN, alarm not  required (fall score 9); patient aware of need for assist with transfers and agreeable to call for assist as needed) Nurse Communication: Mobility status PT Visit Diagnosis: Unsteadiness on feet (R26.81);Muscle weakness (generalized) (M62.81);Difficulty in walking, not elsewhere classified (R26.2)    Time: 4970-2637 PT Time Calculation (min) (ACUTE ONLY): 19 min   Charges:   PT Evaluation $PT Eval Moderate Complexity: 1 Mod PT Treatments $Therapeutic Activity: 8-22 mins       Purl Claytor H. [place] Passey, PT, DPT, NCS 08/03/19, 12:05 PM [phone]

## 2019-08-03 NOTE — Plan of Care (Signed)
  Problem: Education: Goal: Knowledge of General Education information will improve Description Including pain rating scale, medication(s)/side effects and non-pharmacologic comfort measures Outcome: Progressing   

## 2019-08-04 DIAGNOSIS — J9621 Acute and chronic respiratory failure with hypoxia: Secondary | ICD-10-CM | POA: Diagnosis not present

## 2019-08-04 DIAGNOSIS — J189 Pneumonia, unspecified organism: Secondary | ICD-10-CM | POA: Diagnosis not present

## 2019-08-04 DIAGNOSIS — J441 Chronic obstructive pulmonary disease with (acute) exacerbation: Secondary | ICD-10-CM | POA: Diagnosis not present

## 2019-08-04 DIAGNOSIS — D649 Anemia, unspecified: Secondary | ICD-10-CM | POA: Diagnosis not present

## 2019-08-04 LAB — CBC
HCT: 43.3 % (ref 36.0–46.0)
Hemoglobin: 13.5 g/dL (ref 12.0–15.0)
MCH: 27.4 pg (ref 26.0–34.0)
MCHC: 31.2 g/dL (ref 30.0–36.0)
MCV: 87.8 fL (ref 80.0–100.0)
Platelets: 334 10*3/uL (ref 150–400)
RBC: 4.93 MIL/uL (ref 3.87–5.11)
RDW: 14.8 % (ref 11.5–15.5)
WBC: 13.8 10*3/uL — ABNORMAL HIGH (ref 4.0–10.5)
nRBC: 0 % (ref 0.0–0.2)

## 2019-08-04 LAB — BASIC METABOLIC PANEL
Anion gap: 11 (ref 5–15)
BUN: 26 mg/dL — ABNORMAL HIGH (ref 6–20)
CO2: 32 mmol/L (ref 22–32)
Calcium: 9.6 mg/dL (ref 8.9–10.3)
Chloride: 96 mmol/L — ABNORMAL LOW (ref 98–111)
Creatinine, Ser: 0.77 mg/dL (ref 0.44–1.00)
GFR calc Af Amer: >60 mL/min (ref >60)
GFR calc non Af Amer: >60 mL/min (ref >60)
Glucose, Bld: 142 mg/dL — ABNORMAL HIGH (ref 70–99)
Potassium: 4.6 mmol/L (ref 3.5–5.1)
Sodium: 139 mmol/L (ref 135–145)

## 2019-08-04 MED ORDER — MORPHINE SULFATE (CONCENTRATE) 10 MG/0.5ML PO SOLN: 5.0000 mg | ORAL | 0 refills | Status: DC | PRN

## 2019-08-04 MED ORDER — PREDNISONE 10 MG PO TABS
ORAL_TABLET | ORAL | 0 refills | Status: DC
Start: 2019-08-04 — End: 2019-08-30

## 2019-08-04 MED ORDER — CEFDINIR 300 MG PO CAPS: 300.0000 mg | ORAL_CAPSULE | Freq: Two times a day (BID) | ORAL | 0 refills | Status: AC

## 2019-08-04 MED ORDER — AZITHROMYCIN 500 MG PO TABS
500.0000 mg | ORAL_TABLET | Freq: Every day | ORAL | 0 refills | Status: AC
Start: 2019-08-04 — End: 2019-08-07

## 2019-08-04 NOTE — TOC Transition Note (Signed)
Transition of Care (TOC) - CM/SW Discharge Note   Patient Details  Name: [name] MRN: [id number] Date of Birth: 01/29/1960  Transition of Care (TOC) CM/SW Contact:  Sandra J Phillips, LCSW Phone Number: 08/04/2019, 1:44 PM   Clinical Narrative:    Patient discharging home with sister and roommate transporting. CSW met with patient to ensure you has everything needed prior to discharge. Patient stated she is being followed by palliative at home and has a lot of support, couldn't think of anything she needs at this time. CSW left her with cell # in case she thought of something she can call.         Patient Goals and CMS Choice        Discharge Placement                       Discharge Plan and Services                                     Social Determinants of Health (SDOH) Interventions     Readmission Risk Interventions No flowsheet data found.     

## 2019-08-04 NOTE — Discharge Summary (Signed)
[facility]  Physician Discharge Summary   Patient ID: Alicia Gibson MRN: [id number] DOB/AGE: May 08, 1960 59 y.o.  Admit date: 08/01/2019 Discharge date: 08/04/2019  PCP: [name], FNP  DISCHARGE DIAGNOSES:  Acute on chronic respiratory failure with hypoxia Community-acquired pneumonia Advanced COPD with acute exacerbation History of spontaneous left pneumothorax last month Essential hypertension Dyslipidemia Tobacco abuse History of depression   RECOMMENDATIONS FOR OUTPATIENT FOLLOW UP: 1. Follow-up with pulmonology. Will cc this note to [name]. 2. Palliative medicine to continue to follow the patient at home    Home Health: PT OT Equipment/Devices: None  CODE STATUS: DNR  DISCHARGE CONDITION: fair  Diet recommendation: As before  INITIAL HISTORY: 59 y.o.femalewith medical history significant forHTN, depression/anxiety, end-stage COPD with chronic respiratory failure on home O2 at3L, followed by pulmonologist, [name], with recent history on 06/25/2019 of spontaneous left pneumothorax requiring chest tube placement, with rehospitalization again from 6/16 to 07/13/2019 with worsening respiratory failure,who returns to the ER now with worsening shortness of breath and hypoxia. Her roommate apparently went home early as she was not answering the phone and found her to be in bed struggling to breathe with O2 sats 45% on her home flow rate, improving to 85% with O2 up to 4 L. EMS administered albuterol in route.   She was hospitalized for further management.  Consultations:  [facility] Pollack pulmonology  Procedures: Transfusion of 2 units of PRBC   HOSPITAL COURSE:   Acute on chronic respiratory failure with hypoxia Patient initially came in saturating in the mid 80s.  She uses oxygen at home at 3 L/min.  Chest x-ray showed suggested right-sided pneumonia.  She has 2 areas in her left lung which have previously been demonstrated as loculated fluid  collection. Patient was to follow up with pulmonology, [name].   ABG was done which shows a pH of 7.5 and a PCO2 of 53.  She is likely a chronic CO2 retainer.  Pulmonology was consulted.  Patient remains on antibacterials.  She is also on nebulizer treatments and steroids.   Patient slowly started improving.  She feels like she is back to her baseline today.  Wants to go home.  Overall she is stable.  She was also given furosemide which seems to have helped.   Respiratory status is stable.  Okay for discharge.    Community-acquired pneumonia She has pneumonia involving the right lung.  On ceftriaxone and azithromycin.  Changed over to oral antibiotics at discharge.  History of COPD with acute exacerbation Patient appears to have advanced COPD.  Continue with her home inhaler regimen.    Pulmonology was consulted.  Patient is followed by palliative medicine at home with plans for eventual transition to hospice if patient continues to decline.  Pulmonology was consulted here in the hospital.    Normocytic anemia Hemoglobin noted to be 6.3.  Hemoglobin was 10.4 in June.  It was repeated and was noted to be 5.1.  Patient was ordered 2 units of PRBC.  Lasix was given.  Repeat hemoglobin after transfusion came out to be 12.8.  Unclear as to the reason for this discrepancy.   Hemoglobin consistently greater than 12.  Its likely that the initial readings may have been erroneous.   Patient did mention occasional blood in stool.  External examination of rectal area does not show any overt hemorrhoids.  Stool for occult blood was done in the ED and was negative.  Patient mentions that she had a colonoscopy when she was  in [place] a few years ago.  Care everywhere was reviewed.  It looks like she was hospitalized in June 2017 for colitis.  She underwent flexible sigmoidoscopy which revealed inflamed mucosa in the proximal sigmoid.  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   No reports of colonoscopy noted.  In any case patient is not a candidate for elective endoscopy with her current respiratory status at this time. No further work-up at this time.  History of spontaneous left pneumothorax in June 2021 Followed by pulmonology.  Currently x-ray does not show any pneumothorax.  Essential hypertension Patient noted to be on amlodipine which is being continued.  Occasional high readings noted.  Stable for the most part.  Dyslipidemia Continue with Lipitor  Tobacco abuse Continue with nicotine patch  History of depression Continue with Zoloft  Nutrition Problem: Increased nutrient needs Etiology: catabolic illness (COPD)  Signs/Symptoms: estimated needs  Interventions: Ensure Enlive (each supplement provides 350kcal and 20 grams of protein)  Overall stable.  Okay for discharge home today.   PERTINENT LABS:  The results of significant diagnostics from this hospitalization (including imaging, microbiology, ancillary and laboratory) are listed below for reference.    Microbiology: Recent Results (from the past 240 hour(s))  Blood culture (routine x 2)     Status: None (Preliminary result)   Collection Time: 08/01/19  5:49 PM   Specimen: BLOOD  Result Value Ref Range Status   Specimen Description BLOOD RIGHT ANTECUBITAL  Final   Special Requests   Final    BOTTLES DRAWN AEROBIC AND ANAEROBIC Blood Culture adequate volume   Culture   Final    NO GROWTH 3 DAYS Performed at [facility], [address], [place], [place] [address]    Report Status PENDING  Incomplete  Blood culture (routine x 2)     Status: None (Preliminary result)   Collection Time: 08/01/19  5:54 PM   Specimen: BLOOD  Result Value Ref Range Status   Specimen Description BLOOD LEFT ANTECUBITAL  Final   Special Requests   Final    BOTTLES DRAWN AEROBIC AND ANAEROBIC Blood Culture adequate volume   Culture   Final    NO GROWTH 3 DAYS Performed at [facility], [address] [place], [place] [address]    Report Status PENDING  Incomplete  SARS Coronavirus 2 by RT PCR (hospital order, performed in [facility] hospital lab) Nasopharyngeal Nasopharyngeal Swab     Status: None   Collection Time: 08/01/19  6:30 PM   Specimen: Nasopharyngeal Swab  Result Value Ref Range Status   SARS Coronavirus 2 NEGATIVE NEGATIVE Final    Comment: (NOTE) SARS-CoV-2 target nucleic acids are NOT DETECTED.  The SARS-CoV-2 RNA is generally detectable in upper and lower respiratory specimens during the acute phase of infection. The lowest concentration of SARS-CoV-2 viral copies this assay can detect is 250 copies / mL. A negative result does not preclude SARS-CoV-2 infection and should not be used as the sole basis for treatment or other patient management decisions.  A negative result may occur with improper specimen collection / handling, submission of specimen other than nasopharyngeal swab, presence of viral mutation(s) within the areas targeted by this assay, and inadequate number of viral copies (<250 copies / mL). A negative result must be combined with clinical observations, patient history, and epidemiological information.  Fact Sheet for Patients:   BoilerBrush.com.cy  Fact Sheet for Healthcare Providers: https://pope.com/  This test is not  yet approved or  cleared by the [place] and has been authorized for detection and/or diagnosis of SARS-CoV-2 by FDA under an Emergency Use Authorization (EUA).  This EUA will remain in effect (meaning this test can be used) for the duration of the COVID-19 declaration under Section 564(b)(1) of the Act, 21 U.S.C. section 360bbb-3(b)(1), unless the authorization is terminated or revoked sooner.  Performed at [facility], [address] [place], [place] [address]      Labs:    Basic Metabolic Panel: Recent Labs  Lab  08/01/19 1649 08/03/19 0432 08/04/19 0357  NA 141 140 139  K 4.8 3.6 4.6  CL 92* 95* 96*  CO2 39* 33* 32  GLUCOSE 135* 146* 142*  BUN 18 19 26*  CREATININE 0.58 0.59 0.77  CALCIUM 8.8* 9.1 9.6   CBC: Recent Labs  Lab 08/01/19 1649 08/01/19 1749 08/02/19 0937 08/03/19 0618 08/04/19 0357  WBC 9.3 6.8  --  11.9* 13.8*  NEUTROABS 8.6*  --   --   --   --   HGB 6.3* 5.1* 12.8 12.4 13.5  HCT 20.9* 17.2* 41.3 38.8 43.3  MCV 94.6 96.1  --  85.5 87.8  PLT 175 142*  --  301 334   BNP: BNP (last 3 results) Recent Labs    07/10/19 1135 08/01/19 1649  BNP 20.2 135.6*      IMAGING STUDIES  DG Chest Port 1 View  Result Date: 08/02/2019 CLINICAL DATA:  Dyspnea. EXAM: PORTABLE CHEST 1 VIEW COMPARISON:  Chest x-ray August 01, 2019, chest CT July 11 2019 FINDINGS: Stable masses are identified in the left lung. The lungs are hyperinflated. The mediastinal contour and cardiac silhouette are stable. There is no pleural effusion or pulmonary edema. Bony structures are stable. IMPRESSION: No acute cardiopulmonary process identified. COPD. Stable masses of left lung. Electronically Signed   By: Sherian Rein M.D.   On: 08/02/2019 10:20   DG Chest Port 1 View  Result Date: 08/01/2019 CLINICAL DATA:  Shortness of breath. EXAM: PORTABLE CHEST 1 VIEW COMPARISON:  CT chest dated July 11, 2019. Chest x-ray dated July 10, 2019. FINDINGS: The heart size and mediastinal contours are within normal limits. The lungs remain hyperinflated with emphysematous changes. Increased reticulonodular densities in the right upper and lower lobes. Improved reticulonodular densities in the left lower lobe. Unchanged two large smoothly marginated mass-like opacities in the left lung, previously demonstrated to reflect loculated pleural fluid in the major fissure. Previously seen non loculated small left pleural effusion has resolved. No pneumothorax. No acute osseous abnormality. IMPRESSION: 1. Worsening reticulonodular  densities in the right upper and lower lobes concerning for pneumonia. Given interval worsening in the right lung with improvement in the left lower lobe, findings may reflect atypical infection. 2. Unchanged loculated pleural fluid in the left major fissure with resolved small non loculated left pleural effusion. Electronically Signed   By: Obie Dredge M.D.   On: 08/01/2019 17:35    DISCHARGE EXAMINATION: Vitals:   08/03/19 2023 08/04/19 0512 08/04/19 0527 08/04/19 0745  BP: 137/79  (!) 146/90 (!) 143/87  Pulse: (!) 104  79 86  Resp: Temp: 98.4 F (36.9 C) 97.8 F (36.6 C)  98.7 F (37.1 C)  TempSrc: Oral Oral  Oral  SpO2: 96%  97% 96%  Weight:  64.5 kg    Height:       General appearance: Awake alert.  In no distress Resp: Improved aeration bilaterally  with scattered wheezes.  Few crackles at the bases.  No rhonchi.  Improved effort. Cardio: S1-S2 is normal regular.  No S3-S4.  No rubs murmurs or bruit GI: Abdomen is soft.  Nontender nondistended.  Bowel sounds are present normal.  No masses organomegaly    DISPOSITION: Home  Discharge Instructions    Call MD for:  difficulty breathing, headache or visual disturbances   Complete by: As directed    Call MD for:  extreme fatigue   Complete by: As directed    Call MD for:  persistant dizziness or light-headedness   Complete by: As directed    Call MD for:  persistant nausea and vomiting   Complete by: As directed    Call MD for:  severe uncontrolled pain   Complete by: As directed    Call MD for:  temperature >100.4   Complete by: As directed    Diet general   Complete by: As directed    Discharge instructions   Complete by: As directed    Please take your medications as prescribed.  Follow-up with your lung doctor.  Palliative medicine should also continue to follow you.  Seek attention if your symptoms were to get worse.  You were cared for by a hospitalist during your hospital stay. If you have any  questions about your discharge medications or the care you received while you were in the hospital after you are discharged, you can call the unit and asked to speak with the hospitalist on call if the hospitalist that took care of you is not available. Once you are discharged, your primary care physician will handle any further medical issues. Please note that NO REFILLS for any discharge medications will be authorized once you are discharged, as it is imperative that you return to your primary care physician (or establish a relationship with a primary care physician if you do not have one) for your aftercare needs so that they can reassess your need for medications and monitor your lab values. If you do not have a primary care physician, you can call [phone] for a physician referral.   Increase activity slowly   Complete by: As directed         Allergies as of 08/04/2019      Reactions   Contrast Allergy Premed Pack [prednisone & Diphenhydramine] Anaphylaxis   Patient states she had iv contrast in 2008 which caused her throat to close and difficulty breathing. She tolerates methylprednisolone   Iodine Anaphylaxis   Codeine Hives, Nausea And Vomiting   Shellfish Allergy Hives   Penicillins Hives, Rash      Medication List    TAKE these medications   albuterol (2.5 MG/3ML) 0.083% nebulizer solution Commonly known as: PROVENTIL Take 3 mLs (2.5 mg total) by nebulization 4 (four) times daily. What changed: Another medication with the same name was changed. Make sure you understand how and when to take each.   albuterol 108 (90 Base) MCG/ACT inhaler Commonly known as: VENTOLIN HFA INHALE 2 PUFFS INTO THE LUNGS EVERY 6 HOURS AS NEEDED FOR WHEEZING OR SHORTNESS OF BREATH What changed: See the new instructions.   amLODipine 10 MG tablet Commonly known as: NORVASC Take 1 tablet (10 mg total) by mouth daily.   aspirin EC 81 MG tablet Take 81 mg by mouth daily.   atorvastatin 40 MG  tablet Commonly known as: LIPITOR TAKE 1 TABLET(40 MG) BY MOUTH DAILY   azithromycin 500 MG tablet Commonly known as: Zithromax Take 1  tablet (500 mg total) by mouth daily for 3 days. Take 1 tablet daily for 3 days.   budesonide 0.5 MG/2ML nebulizer solution Commonly known as: PULMICORT Take 2 mLs (0.5 mg total) by nebulization 2 (two) times daily.   cefdinir 300 MG capsule Commonly known as: OMNICEF Take 1 capsule (300 mg total) by mouth 2 (two) times daily for 5 days.   cetirizine 10 MG tablet Commonly known as: ZYRTEC Take 1 tablet (10 mg total) by mouth daily.   cyclobenzaprine 10 MG tablet Commonly known as: FLEXERIL TAKE 1 TABLET BY MOUTH 2 TIMES DAILY AS NEEDED FOR MUSCLE SPASMS What changed:   how much to take  how to take this  when to take this  reasons to take this  additional instructions   hydrOXYzine 25 MG tablet Commonly known as: ATARAX/VISTARIL Take 1 tablet (25 mg total) by mouth 3 (three) times daily as needed for anxiety.   morphine CONCENTRATE 10 MG/0.5ML Soln concentrated solution Take 0.25 mLs (5 mg total) by mouth every 4 (four) hours as needed for severe pain or shortness of breath.   Mucinex 600 MG 12 hr tablet Generic drug: guaiFENesin Take 2 tablets (1,200 mg total) by mouth 2 (two) times daily as needed for cough or to loosen phlegm.   multivitamin with minerals Tabs tablet Take 1 tablet by mouth daily.   nicotine 21 mg/24hr patch Commonly known as: NICODERM CQ - dosed in mg/24 hours Place 1 patch (21 mg total) onto the skin daily.   omeprazole 20 MG tablet Commonly known as: PRILOSEC OTC Take 1 tablet (20 mg total) by mouth daily.   Perforomist 20 MCG/2ML nebulizer solution Generic drug: formoterol Take 2 mLs (20 mcg total) by nebulization 2 (two) times daily.   predniSONE 10 MG tablet Commonly known as: DELTASONE Take 4 tabs once daily x 3 days, then 3 tabs once daily for 3 days, then 2 tabs once daily for 3 days, then 1  tab once daily for 3 days, then OFF. What changed: additional instructions   sertraline 100 MG tablet Commonly known as: ZOLOFT Take 1 tablet (100 mg total) by mouth daily.         Follow-up Information    Malfi, Jodelle GrossNicole M, FNP. Schedule an appointment as soon as possible for a visit in 2 week(s).   Specialty: Family Medicine Contact information: [address] [place] [address] [phone]               TOTAL DISCHARGE TIME: 35 minutes  Rabecka Brendel Rito EhrlichKrishnan  [facility] Pager on [email or web address]  08/04/2019, 1:00 PM

## 2019-08-05 ENCOUNTER — Other Ambulatory Visit: Payer: Self-pay | Admitting: Family Medicine

## 2019-08-05 DIAGNOSIS — J449 Chronic obstructive pulmonary disease, unspecified: Secondary | ICD-10-CM

## 2019-08-06 ENCOUNTER — Telehealth: Payer: Self-pay

## 2019-08-06 LAB — CULTURE, BLOOD (ROUTINE X 2)
Culture: NO GROWTH
Culture: NO GROWTH
Special Requests: ADEQUATE
Special Requests: ADEQUATE

## 2019-08-06 NOTE — Telephone Encounter (Signed)
Phone call placed to check in on patient after recent hospitalization. Patient shared she was feeling a little better and trying to rest. Patient spoke with Palliative NP this morning and scheduled a follow up visit.

## 2019-08-09 ENCOUNTER — Other Ambulatory Visit: Payer: Self-pay | Admitting: Nurse Practitioner

## 2019-08-09 DIAGNOSIS — F419 Anxiety disorder, unspecified: Secondary | ICD-10-CM

## 2019-08-12 ENCOUNTER — Ambulatory Visit: Payer: Medicaid Other | Admitting: Pharmacist

## 2019-08-12 ENCOUNTER — Ambulatory Visit: Payer: Self-pay | Admitting: *Deleted

## 2019-08-12 DIAGNOSIS — J449 Chronic obstructive pulmonary disease, unspecified: Secondary | ICD-10-CM

## 2019-08-12 NOTE — Patient Instructions (Signed)
Thank you allowing the Care Management Team to be a part of your care! It was a pleasure speaking with you today!     Care Management Team    Alto Denver RN, MSN, CCM Nurse Care Coordinator  [phone]   Duanne Moron PharmD  Clinical Pharmacist  [phone]   Dickie La LCSW Clinical Social Worker [phone]   Visit Information  Goals Addressed            This Visit's Progress   . PharmD - Medication Management       CARE PLAN ENTRY (see longitudinal plan of care for additional care plan information)   Current Barriers:  . Chronic Disease Management support, education, and care coordination needs related to COPD on oxygen, HTN, depression/anxiety, HLD and GERD  Pharmacist Clinical Goal(s):  Marland Kitchen Over the next 30 days, patient will work with CM Pharmacist and PCP to address needs related to medication adherence  Interventions: . Perform chart review. Note patient admitted to [facility] 7/8-7/11 for acute on chronic respiratory failure. . Discuss with patient and caregiver importance of medication adherence o Patient reports medications currently managed by her sister, [name] who is now staying with and caring for her o [name] reports patient currently has all of her medications as prescribed at discharge and reports that she administers these to patient o Encourage caregiver to obtain and use weekly pillbox as adherence tool . Coordination of care o Counsel to schedule a post-discharge follow up visit with Pulmonologist. Provide caregiver with phone number to [facility]. o Confirm patient planning to attend PCP visit scheduled for 8/3 . Schedule appointment to complete medication review . Provider and Inter-disciplinary care team collaboration (see longitudinal plan of care) . Discussed plans with patient for ongoing care management follow up and provided patient with direct contact information for care management  team  Patient Self Care Activities:  . Calls provider office for new concerns or questions . Unable to self administer medications as prescribed. Sister currently managing medications for patient  Initial goal documentation        Patient verbalizes understanding of instructions provided today.   Telephone follow up appointment with care management team member scheduled for: 8/6 at 9 am  Duanne Moron, PharmD, [facility] Clinical Pharmacist [facility] [employer] [phone]

## 2019-08-12 NOTE — Telephone Encounter (Signed)
Patient has mild facial puffiness that the mother has noticed over the last several hours. Patient is on 3.5L O2 via [place] continuously. Also is on day six of steroid dose pak. No distress reported at this time. No wheezing/difficulty breathing/swallowing/coughing/CP/rash/itching. No fever and denies pain. Mild ankle swelling reported also. O2 sat is 94-96%. Dropped briefly to the 30's this morning then immediately back up.Hospitalization last week but follow-up appointment was scheduled another 2 weeks out. Reviewed urgent symptoms requiring immediate evaluation, the mother-caregiver stated understanding. Scheduled for OV thru DT for 08/13/19 @1 :20p Reason for Disposition . Mild facial swelling of unknown cause  Answer Assessment - Initial Assessment Questions 1. ONSET: "When did the swelling start?" (e.g., minutes, hours, days)    Over the last several hours. 2. LOCATION: "What part of the face is swollen?"     Entire face looks swollen 3. SEVERITY: "How swollen is it?"     Skin blanches and indents but bounces back quickly. 4. ITCHING: "Is there any itching?" If Yes, ask: "How much?"   (Scale 1-10; mild, moderate or severe)     No 5. PAIN: "Is the swelling painful to touch?" If Yes, ask: "How painful is it?"   (Scale 1-10; mild, moderate or severe)     no 6. FEVER: "Do you have a fever?" If Yes, ask: "What is it, how was it measured, and when did it start?"      no 7. CAUSE: "What do you think is causing the face swelling?"     She is finishing a round of steroids but unsure with the edema appearing just over hours today. 8. RECURRENT SYMPTOM: "Have you had face swelling before?" If Yes, ask: "When was the last time?" "What happened that time?"     no 9. OTHER SYMPTOMS: "Do you have any other symptoms?" (e.g., toothache, leg swelling)     Patient's mother reports the patient has trace amount of ankle edema. 10. PREGNANCY: "Is there any chance you are pregnant?" "When was your last menstrual  period?"       na  Protocols used: [facility]

## 2019-08-12 NOTE — Chronic Care Management (AMB) (Signed)
  Care Management   Follow Up Note   08/12/2019 Name: Kampbell Holaway MRN: [id number] DOB: 03/24/1960  Referred by: [name], FNP Reason for referral : Chronic Care Management (Patient Phone Call)   Ashleymarie Granderson is a 59 y.o. year old female who is a primary care patient of [name], FNP. The care management team was consulted for assistance with care management and care coordination needs.    Note patient admitted to [facility] 7/8-7/11 for acute on chronic respiratory failure.  Outreach to [employer] by phone today. Ms. Hickling asks/gives permission for me to speak with her sister [name] who she reports is currently caring for her and managing her medications.  Review of patient status, including review of consultants reports, relevant laboratory and other test results, and collaboration with appropriate care team members and the patient's provider was performed as part of comprehensive patient evaluation and provision of chronic care management services.    SDOH (Social Determinants of Health) assessments performed: No See Care Plan activities for detailed interventions related to [facility])     Advanced Directives: See Care Plan and Vynca application for related entries.   Goals Addressed            This Visit's Progress   . PharmD - Medication Management       CARE PLAN ENTRY (see longitudinal plan of care for additional care plan information)   Current Barriers:  . Chronic Disease Management support, education, and care coordination needs related to COPD on oxygen, HTN, depression/anxiety, HLD and GERD  Pharmacist Clinical Goal(s):  Marland Kitchen Over the next 30 days, patient will work with CM Pharmacist and PCP to address needs related to medication adherence  Interventions: . Perform chart review. Note patient admitted to [facility] 7/8-7/11 for acute on chronic respiratory failure. . Discuss with patient and caregiver  importance of medication adherence o Patient reports medications currently managed by her sister, [name] who is now staying with and caring for her o [name] reports patient currently has all of her medications as prescribed at discharge and reports that she administers these to patient o Encourage caregiver to obtain and use weekly pillbox as adherence tool . Coordination of care o Counsel to schedule a post-discharge follow up visit with Pulmonologist. Provide caregiver with phone number to [facility]. o Confirm patient planning to attend PCP visit scheduled for 8/3 . Schedule appointment to complete medication review . Provider and Inter-disciplinary care team collaboration (see longitudinal plan of care) . Discussed plans with patient for ongoing care management follow up and provided patient with direct contact information for care management team  Patient Self Care Activities:  . Calls provider office for new concerns or questions . Unable to self administer medications as prescribed. Sister currently managing medications for patient  Initial goal documentation        Plan  Telephone follow up appointment with care management team member scheduled for: 8/6 at 9 am  Duanne Moron, PharmD, [facility] Clinical Pharmacist [facility] Care Management [phone]

## 2019-08-13 ENCOUNTER — Encounter: Payer: Self-pay | Admitting: Family Medicine

## 2019-08-13 ENCOUNTER — Other Ambulatory Visit: Payer: Self-pay

## 2019-08-13 ENCOUNTER — Ambulatory Visit: Payer: Medicaid Other | Admitting: Family Medicine

## 2019-08-13 ENCOUNTER — Ambulatory Visit: Payer: Self-pay | Admitting: Family Medicine

## 2019-08-13 VITALS — BP 121/70 | HR 113 | Temp 98.2°F | Resp 24 | Ht 66.0 in | Wt 144.6 lb

## 2019-08-13 DIAGNOSIS — Z716 Tobacco abuse counseling: Secondary | ICD-10-CM

## 2019-08-13 DIAGNOSIS — R220 Localized swelling, mass and lump, head: Secondary | ICD-10-CM | POA: Diagnosis not present

## 2019-08-13 DIAGNOSIS — J439 Emphysema, unspecified: Secondary | ICD-10-CM

## 2019-08-13 DIAGNOSIS — Z515 Encounter for palliative care: Secondary | ICD-10-CM | POA: Diagnosis not present

## 2019-08-13 DIAGNOSIS — R0602 Shortness of breath: Secondary | ICD-10-CM | POA: Diagnosis not present

## 2019-08-13 MED ORDER — NICOTINE 21 MG/24HR TD PT24: 21.0000 mg | MEDICATED_PATCH | Freq: Every day | TRANSDERMAL | 0 refills | Status: DC

## 2019-08-13 MED ORDER — MORPHINE SULFATE (CONCENTRATE) 10 MG/0.5ML PO SOLN: 5.0000 mg | ORAL | 0 refills | Status: DC | PRN

## 2019-08-13 NOTE — Patient Instructions (Addendum)
As we discussed, can continue all medications as directed.  Can begin using a table top fan for trigeminal nerve stimulation as the palliative care provider recommended.  We will plan to see you back in 1-2 months for COPD follow up visit  You will receive a survey after today's visit either digitally by e-mail or paper by USPS mail. Your experiences and feedback matter to [name].  Please respond so we know how we are doing as we provide care for you.  Call us with any questions/concerns/needs.  It is my goal to be available to you for your health concerns.  Thanks for choosing me to be a partner in your healthcare needs!  Charlaine Dalton, FNP-C Family Nurse Practitioner [facility] Phone: [phone]

## 2019-08-13 NOTE — Assessment & Plan Note (Signed)
Mild facial swelling without difficulty breathing, difficulty swallowing or hoarseness.  Reports has gotten much better since the day prior.  Sister is present with patient, provided photo with moderate facial swelling from a day prior.  No new foods, medications or OTC herbals/supplements.  Plan: 1. Discussed with patient, may have been aggravation from oxygen tubing on face but to watch and if swelling gets worse, to notify us.  If swelling causes difficulty breathing, tongue swelling or difficulty swallowing to proceed to ER for evaluation 2. RTC PRN

## 2019-08-13 NOTE — Progress Notes (Signed)
Subjective:    Patient ID: Alicia Gibson, female    DOB: 02/17/1960, 59 y.o.   MRN: [id number]  Alicia Gibson is a 59 y.o. female presenting on 08/13/2019 for COPD (chronic COPD, face swelling x 3 days )   HPI  Alicia Gibson presents to clinic for concerns of facial swelling x 3 days.  Reports swelling has started to resolve but wanted to come in to be seen to make sure was ok.  Denies any new foods, medications, OTC herbals or supplements.  Reports when had increase in facial swelling did not have worsening shortness of breath, difficulty breathing, difficulty swallowing, hoarseness or stridor.  Sister reports she has been in town and is assisting administer patient's medications.  Reports that Alicia Gibson did ambulate to the restroom 2 days prior and had some increased shortness of breath with a pulse ox registering in the 30's.  Reports they increased patient's oxygen to 4L and she did get back up into the low 90's and had some symptom improvement.  [facility] 05/27/2019 11/13/2018 08/13/2018  Decreased Interest 1 0 0  Down, Depressed, Hopeless 1 0 1  PHQ - 2 Score 2 0 1  Altered sleeping 1 1 0  Tired, decreased energy 1 1 1   Change in appetite 0 0 0  Feeling bad or failure about yourself  0 0 0  Trouble concentrating 0 0 0  Moving slowly or fidgety/restless 1 0 1  Suicidal thoughts 0 0 0  PHQ-9 Score 5 2 3   Difficult doing work/chores Not difficult at all Not difficult at all Not difficult at all    Social History   Tobacco Use  . Smoking status: Former Smoker    Packs/day: 0.10    Types: Cigarettes  . Smokeless tobacco: Never Used  Vaping Use  . Vaping Use: Never used  Substance Use Topics  . Alcohol use: Never  . Drug use: Never    Review of Systems  Constitutional: Negative.   HENT: Positive for facial swelling. Negative for congestion, dental problem, drooling, ear discharge, ear pain, hearing loss, mouth sores, nosebleeds, postnasal drip, rhinorrhea,  sinus pressure, sinus pain, sneezing, sore throat, tinnitus, trouble swallowing and voice change.   Eyes: Negative.   Respiratory: Positive for shortness of breath. Negative for apnea, cough, choking, chest tightness, wheezing and stridor.   Cardiovascular: Negative.   Gastrointestinal: Negative.   Endocrine: Negative.   Genitourinary: Negative.   Musculoskeletal: Negative.   Skin: Negative.   Allergic/Immunologic: Negative.   Neurological: Negative.   Hematological: Negative.   Psychiatric/Behavioral: Negative.    Per HPI unless specifically indicated above     Objective:    BP 121/70 (BP Location: Right Arm, Patient Position: Sitting, Cuff Size: Normal)   Pulse (!) 113   Temp 98.2 F (36.8 C) (Oral)   Resp (!) 24   Ht 5\' 6"  (1.676 m)   Wt 144 lb 9.6 oz (65.6 kg)   SpO2 93% Comment: 3L  BMI 23.34 kg/m   Wt Readings from Last 3 Encounters:  08/13/19 144 lb 9.6 oz (65.6 kg)  08/04/19 142 lb 4.8 oz (64.5 kg)  07/10/19 141 lb 1.5 oz (64 kg)    Physical Exam Vitals reviewed.  Constitutional:      Appearance: Normal appearance. She is normal weight.  HENT:     Head: Normocephalic and atraumatic.     Jaw: No tenderness, swelling or pain on movement.     Salivary Glands: Right salivary gland is  not diffusely enlarged. Left salivary gland is not diffusely enlarged.     Comments: Slight facial edema at bases of cheeks    Nose:     Comments: Facemask is in place, covering mouth and nose  Eyes:     General: Lids are normal. Vision grossly intact.        Right eye: No discharge.        Left eye: No discharge.     Extraocular Movements: Extraocular movements intact.     Conjunctiva/sclera: Conjunctivae normal.     Pupils: Pupils are equal, round, and reactive to light.  Neck:     Thyroid: No thyroid mass or thyromegaly.  Cardiovascular:     Rate and Rhythm: Normal rate and regular rhythm.     Pulses: Normal pulses.     Heart sounds: Normal heart sounds. No murmur heard.    No friction rub. No gallop.   Pulmonary:     Effort: Tachypnea and prolonged expiration present. No bradypnea, respiratory distress or retractions.     Breath sounds: No stridor. Examination of the right-lower field reveals decreased breath sounds. Examination of the left-lower field reveals decreased breath sounds. Decreased breath sounds and wheezing present. No rhonchi or rales.     Comments: Diffuse expiratory wheezing Musculoskeletal:     Cervical back: Full passive range of motion without pain and neck supple.     Right lower leg: No edema.     Left lower leg: No edema.  Lymphadenopathy:     Cervical: No cervical adenopathy.  Skin:    General: Skin is warm and dry.     Capillary Refill: Capillary refill takes less than 2 seconds.  Neurological:     General: No focal deficit present.     Mental Status: She is alert and oriented to person, place, and time.     Cranial Nerves: No cranial nerve deficit.     Sensory: No sensory deficit.     Motor: No weakness.     Coordination: Coordination normal.     Gait: Gait normal.  Psychiatric:        Attention and Perception: Attention and perception normal.        Mood and Affect: Mood and affect normal.        Speech: Speech normal.        Behavior: Behavior normal. Behavior is cooperative.        Thought Content: Thought content normal.        Cognition and Memory: Cognition and memory normal.        Judgment: Judgment normal.    Results for orders placed or performed during the hospital encounter of 08/01/19  Blood culture (routine x 2)   Specimen: BLOOD  Result Value Ref Range   Specimen Description BLOOD RIGHT ANTECUBITAL    Special Requests      BOTTLES DRAWN AEROBIC AND ANAEROBIC Blood Culture adequate volume   Culture      NO GROWTH 5 DAYS Performed at [facility], [address] [place], [place] [address]    Report Status 08/06/2019 FINAL   Blood culture (routine x 2)   Specimen: BLOOD  Result Value Ref  Range   Specimen Description BLOOD LEFT ANTECUBITAL    Special Requests      BOTTLES DRAWN AEROBIC AND ANAEROBIC Blood Culture adequate volume   Culture      NO GROWTH 5 DAYS Performed at [facility], [address] [place], [place] [address]  Report Status 08/06/2019 FINAL   SARS Coronavirus 2 by RT PCR (hospital order, performed in [facility] hospital lab) Nasopharyngeal Nasopharyngeal Swab   Specimen: Nasopharyngeal Swab  Result Value Ref Range   SARS Coronavirus 2 NEGATIVE NEGATIVE  CBC with Differential  Result Value Ref Range   WBC 9.3 4.0 - 10.5 K/uL   RBC 2.21 (L) 3.87 - 5.11 MIL/uL   Hemoglobin 6.3 (L) 12.0 - 15.0 g/dL   HCT 95.0 (L) 36 - 46 %   MCV 94.6 80.0 - 100.0 fL   MCH 28.5 26.0 - 34.0 pg   MCHC 30.1 30.0 - 36.0 g/dL   RDW 93.2 67.1 - 24.5 %   Platelets 175 150 - 400 K/uL   nRBC 0.0 0.0 - 0.2 %   Neutrophils Relative % 93 %   Neutro Abs 8.6 (H) 1.7 - 7.7 K/uL   Lymphocytes Relative 4 %   Lymphs Abs 0.3 (L) 0.7 - 4.0 K/uL   Monocytes Relative 3 %   Monocytes Absolute 0.3 0 - 1 K/uL   Eosinophils Relative 0 %   Eosinophils Absolute 0.0 0 - 0 K/uL   Basophils Relative 0 %   Basophils Absolute 0.0 0 - 0 K/uL   Immature Granulocytes 0 %   Abs Immature Granulocytes 0.04 0.00 - 0.07 K/uL  Basic metabolic panel  Result Value Ref Range   Sodium 141 135 - 145 mmol/L   Potassium 4.8 3.5 - 5.1 mmol/L   Chloride 92 (L) 98 - 111 mmol/L   CO2 39 (H) 22 - 32 mmol/L   Glucose, Bld 135 (H) 70 - 99 mg/dL   BUN 18 6 - 20 mg/dL   Creatinine, Ser 8.09 0.44 - 1.00 mg/dL   Calcium 8.8 (L) 8.9 - 10.3 mg/dL   GFR calc non Af Amer >60 >60 mL/min   GFR calc Af Amer >60 >60 mL/min   Anion gap 10 5 - 15  Brain natriuretic peptide  Result Value Ref Range   B Natriuretic Peptide 135.6 (H) 0.0 - 100.0 pg/mL  CBC  Result Value Ref Range   WBC 6.8 4.0 - 10.5 K/uL   RBC 1.79 (L) 3.87 - 5.11 MIL/uL   Hemoglobin 5.1 (L) 12.0 - 15.0 g/dL   HCT 98.3 (L) 36 - 46 %    MCV 96.1 80.0 - 100.0 fL   MCH 28.5 26.0 - 34.0 pg   MCHC 29.7 (L) 30.0 - 36.0 g/dL   RDW 38.2 50.5 - 39.7 %   Platelets 142 (L) 150 - 400 K/uL   nRBC 0.0 0.0 - 0.2 %  Lactic acid, plasma  Result Value Ref Range   Lactic Acid, Venous 0.6 0.5 - 1.9 mmol/L  HIV Antibody (routine testing w rflx)  Result Value Ref Range   HIV Screen 4th Generation wRfx Non Reactive Non Reactive  Hemoglobin and hematocrit, blood  Result Value Ref Range   Hemoglobin 12.8 12.0 - 15.0 g/dL   HCT 67.3 36 - 46 %  Blood gas, arterial  Result Value Ref Range   FIO2 0.36    Delivery systems NASAL CANNULA    pH, Arterial 7.51 (H) 7.35 - 7.45   pCO2 arterial 54 (H) 32 - 48 mmHg   pO2, Arterial 53 (L) 83 - 108 mmHg   Bicarbonate 43.1 (H) 20.0 - 28.0 mmol/L   Acid-Base Excess 17.3 (H) 0.0 - 2.0 mmol/L   O2 Saturation 90.3 %   Patient temperature 37.0    Collection site RIGHT BRACHIAL  Sample type ARTERIAL DRAW    Allens test (pass/fail) PASS PASS  Basic metabolic panel  Result Value Ref Range   Sodium 140 135 - 145 mmol/L   Potassium 3.6 3.5 - 5.1 mmol/L   Chloride 95 (L) 98 - 111 mmol/L   CO2 33 (H) 22 - 32 mmol/L   Glucose, Bld 146 (H) 70 - 99 mg/dL   BUN 19 6 - 20 mg/dL   Creatinine, Ser 6.29 0.44 - 1.00 mg/dL   Calcium 9.1 8.9 - 52.8 mg/dL   GFR calc non Af Amer >60 >60 mL/min   GFR calc Af Amer >60 >60 mL/min   Anion gap 12 5 - 15  CBC  Result Value Ref Range   WBC 11.9 (H) 4.0 - 10.5 K/uL   RBC 4.54 3.87 - 5.11 MIL/uL   Hemoglobin 12.4 12.0 - 15.0 g/dL   HCT 41.3 36 - 46 %   MCV 85.5 80.0 - 100.0 fL   MCH 27.3 26.0 - 34.0 pg   MCHC 32.0 30.0 - 36.0 g/dL   RDW 24.4 01.0 - 27.2 %   Platelets 301 150 - 400 K/uL   nRBC 0.0 0.0 - 0.2 %  CBC  Result Value Ref Range   WBC 13.8 (H) 4.0 - 10.5 K/uL   RBC 4.93 3.87 - 5.11 MIL/uL   Hemoglobin 13.5 12.0 - 15.0 g/dL   HCT 53.6 36 - 46 %   MCV 87.8 80.0 - 100.0 fL   MCH 27.4 26.0 - 34.0 pg   MCHC 31.2 30.0 - 36.0 g/dL   RDW 64.4 03.4 - 74.2 %    Platelets 334 150 - 400 K/uL   nRBC 0.0 0.0 - 0.2 %  Basic metabolic panel  Result Value Ref Range   Sodium 139 135 - 145 mmol/L   Potassium 4.6 3.5 - 5.1 mmol/L   Chloride 96 (L) 98 - 111 mmol/L   CO2 32 22 - 32 mmol/L   Glucose, Bld 142 (H) 70 - 99 mg/dL   BUN 26 (H) 6 - 20 mg/dL   Creatinine, Ser 5.95 0.44 - 1.00 mg/dL   Calcium 9.6 8.9 - 63.8 mg/dL   GFR calc non Af Amer >60 >60 mL/min   GFR calc Af Amer >60 >60 mL/min   Anion gap 11 5 - 15  Type and screen  Result Value Ref Range   ABO/RH(D) A POS    Antibody Screen NEG    Sample Expiration 08/04/2019,2359    Unit Number [id number]    Blood Component Type RED CELLS,LR    Unit division 00    Status of Unit ISSUED,FINAL    Transfusion Status OK TO TRANSFUSE    Crossmatch Result      Compatible Performed at [facility], [address], [place], [place] [address]    Unit Number [id number]    Blood Component Type RED CELLS,LR    Unit division 00    Status of Unit ISSUED,FINAL    Transfusion Status OK TO TRANSFUSE    Crossmatch Result Compatible   Prepare RBC (crossmatch)  Result Value Ref Range   Order Confirmation      ORDER PROCESSED BY BLOOD BANK Performed at [facility], [address], [place], [place] [address]   ABO/Rh  Result Value Ref Range   ABO/RH(D)      A POS Performed at [facility], [address] [place], [place] [address]   BPAM RBC  Result Value Ref Range  ISSUE DATE / TIME 099833825053    Blood Product Unit Number [id number]    PRODUCT CODE E0382V00    Unit Type and Rh 6200    Blood Product Expiration Date 409735329924    ISSUE DATE / TIME 268341962229    Blood Product Unit Number [id number]    PRODUCT CODE [id number]    Unit Type and Rh 6200    Blood Product Expiration Date 631497026378   Troponin I (High Sensitivity)  Result Value Ref Range   Troponin I (High Sensitivity) 18 (H) <18 ng/L  Troponin I (High Sensitivity)  Result Value Ref  Range   Troponin I (High Sensitivity) 17 <18 ng/L      Assessment & Plan:   Problem List Items Addressed This Visit      Respiratory   Pulmonary emphysema (HCC)   Relevant Medications   nicotine (NICODERM CQ - DOSED IN MG/24 HOURS) 21 mg/24hr patch   Morphine Sulfate (MORPHINE CONCENTRATE) 10 MG/0.5ML SOLN concentrated solution     Other   Palliative care patient    Requesting refill on her morphine sulfate solution.  Discussed switching to MS Contin 15mg  Q12 hours for easier dosing and administration throughout the day.  Patient states she will think about this, but is happy with the current liquid morphine at this time.  Sister is present in the home and administering medications.  Has next appointment with [facility] Palliative Care Nurse Practitioner tomorrow.  PDMP reviewed, last refill 08/05/2019.  Refill to be provided today.  Plan: 1. Refill on Morphine sent to pharmacy on file. 2. RTC in 1-2 months for re-evaluation      Relevant Medications   Morphine Sulfate (MORPHINE CONCENTRATE) 10 MG/0.5ML SOLN concentrated solution   Shortness of breath   Relevant Medications   Morphine Sulfate (MORPHINE CONCENTRATE) 10 MG/0.5ML SOLN concentrated solution   Palliative care by specialist - Primary   Relevant Medications   Morphine Sulfate (MORPHINE CONCENTRATE) 10 MG/0.5ML SOLN concentrated solution   Facial swelling    Mild facial swelling without difficulty breathing, difficulty swallowing or hoarseness.  Reports has gotten much better since the day prior.  Sister is present with patient, provided photo with moderate facial swelling from a day prior.  No new foods, medications or OTC herbals/supplements.  Plan: 1. Discussed with patient, may have been aggravation from oxygen tubing on face but to watch and if swelling gets worse, to notify 10/06/2019.  If swelling causes difficulty breathing, tongue swelling or difficulty swallowing to proceed to ER for evaluation 2. RTC PRN         Other Visit Diagnoses    Encounter for smoking cessation counseling       Relevant Medications   nicotine (NICODERM CQ - DOSED IN MG/24 HOURS) 21 mg/24hr patch      Meds ordered this encounter  Medications  . nicotine (NICODERM CQ - DOSED IN MG/24 HOURS) 21 mg/24hr patch    Sig: Place 1 patch (21 mg total) onto the skin daily.    Dispense:  28 patch    Refill:  0  . Morphine Sulfate (MORPHINE CONCENTRATE) 10 MG/0.5ML SOLN concentrated solution    Sig: Take 0.25 mLs (5 mg total) by mouth every 4 (four) hours as needed for severe pain or shortness of breath.    Dispense:  118 mL    Refill:  0      Follow up plan: Return in about 2 months (around 10/14/2019) for COPD Follow Up Visit.   10/16/2019, FNP  Family Nurse Practitioner [facility] 08/13/2019, 2:14 PM

## 2019-08-13 NOTE — Assessment & Plan Note (Addendum)
Requesting refill on her morphine sulfate solution.  Discussed switching to MS Contin 15mg  Q12 hours for easier dosing and administration throughout the day.  Patient states she will think about this, but is happy with the current liquid morphine at this time.  Sister is present in the home and administering medications.  Has next appointment with [facility] Nurse Practitioner tomorrow.  PDMP reviewed, last refill 08/05/2019.  Refill to be provided today.  Plan: 1. Refill on Morphine sent to pharmacy on file. 2. RTC in 1-2 months for re-evaluation

## 2019-08-14 ENCOUNTER — Other Ambulatory Visit: Payer: Self-pay | Admitting: Internal Medicine

## 2019-08-14 ENCOUNTER — Other Ambulatory Visit: Payer: Medicaid Other | Admitting: Primary Care

## 2019-08-14 DIAGNOSIS — Z515 Encounter for palliative care: Secondary | ICD-10-CM

## 2019-08-14 DIAGNOSIS — R062 Wheezing: Secondary | ICD-10-CM

## 2019-08-14 DIAGNOSIS — J441 Chronic obstructive pulmonary disease with (acute) exacerbation: Secondary | ICD-10-CM

## 2019-08-14 NOTE — Progress Notes (Signed)
[place] Consult Note Telephone: [phone]  Fax: [phone]  PATIENT NAME: Alicia Gibson [address] [place] [address] [phone] (home)  DOB: 1960/08/27 MRN: [id number]  PRIMARY CARE PROVIDER:    Verl Gibson, [place],  [place] [address] [phone]  REFERRING PROVIDER:   Verl Gibson, Alicia Gibson,  Alicia Gibson [address] [phone]  RESPONSIBLE PARTY:   Extended Emergency Contact Information Primary Emergency Contact: Alicia Gibson Address: [address]          [place], [place] [address] Mobile Phone: [phone] Relation: Friend Secondary Emergency Contact: Alicia Gibson [place] Phone: [phone] Mobile Phone: [phone] Relation: Sister  I met face to face with patient and family in home.  ASSESSMENT AND RECOMMENDATIONS:   1. Advance Care Planning/Goals of Care: Goals include to maximize quality of life and symptom management. Our advance care planning conversation included a discussion about:     The value and importance of advance care planning   Exploration of personal, cultural or spiritual beliefs that might influence medical decisions   Exploration of goals of care in the event of a sudden injury or illness   Identification and preparation of a healthcare agent   Review of an  advance directive document . She will discuss with sister coming this week. WE reviewed [facility], and the need to name a HCPOA. They will work on Theatre manager.  2. Symptom Management:   Dyspnea: Has had some drops in oxygen to 30% reportedly. Has an oxygen mask for hs. Has failed CPAP and BIPAP. Using nebulizer, I suggested an IS in addition for pulmonary toilet. Requests placard for handicap car registration which I will generate.  BP:  Eats well, reports eating high sodium. We discussed the effect on BP. On bp meds now and compliant.May benefit from some diet education as she at  [facility] last pm.  Smoking cessation: On nicotine patch now for cessation, 21 mg patch. Endorses having taste return. We discussed her cessation journey and she says she is doing well at this point.  Fatigue: Endorses some with a day activity. Using assistant device. Needs a HEP, but has a pool for exercises, will do for rehab.   Pain: Endorses in abdomin and back. Endorses some epigastric pain. Taking 2.5 mg morphine 4-6 hours. States it helps with pain and breathing. She does not want to increase dose, so should stay on roxanol b/c mg  of MS Contin are not available in this low dosage.  3. Family /Caregiver/Community Supports: Sister is care giver, we discussed her applying to be pt Physiological scientist. Needs PCS application for helping with adls. Sister states pt has hospital stays after she has left to go home. She needs additional care when her sister is away.  4. Cognitive / Functional decline: Alert and oriented x 3, Endorses ability to do few adls, iadls. Ambulates in home with touch to steady.  5. Follow up Palliative Care Visit: Palliative care will continue to follow for goals of care clarification and symptom management. Return 4 weeks or prn.  I spent 60 minutes providing this consultation,  from 1415 to 1515. More than 50% of the time in this consultation was spent coordinating communication.   CHIEF COMPLAINT: Dyspnea, pain, advance care planning needs  HISTORY OF PRESENT ILLNESS:  Alicia Gibson is a 59 y.o. year old female with multiple medical problems including COPD with frequent exacerbations, anxiety, depression. Palliative  Care was asked to follow this patient by consultation request of Alicia Gibson, Alicia Raider, Alicia Gibson to help address advance care planning and goals of care. This is a follow up visit.  CODE STATUS:  TBD  PPS: 40%  HOSPICE ELIGIBILITY/DIAGNOSIS: TBD  PAST MEDICAL HISTORY:  Past Medical History:  Diagnosis Date  . Allergy   . Anxiety   . Colitis    recurrent  .  COPD (chronic obstructive pulmonary disease) ([place])   . Depression   . Graves disease    s/p iodine radiation treatment - euthyroid for many years per pt  . Hyperlipidemia   . Hypertension   . Loss of eye, LEFT 2007    SOCIAL HX:  Social History   Tobacco Use  . Smoking status: Former Smoker    Packs/day: 0.10    Types: Cigarettes  . Smokeless tobacco: Never Used  Substance Use Topics  . Alcohol use: Never   FAMILY HX:  Family History  Problem Relation Age of Onset  . Heart disease Father   . Alcohol abuse Father   . Alcohol abuse Brother   . Heart failure Sister   . Celiac disease Sister   . Healthy Sister     ALLERGIES:  Allergies  Allergen Reactions  . Contrast Allergy Premed Pack [Prednisone & Diphenhydramine] Anaphylaxis    Patient states she had iv contrast in 2008 which caused her throat to close and difficulty breathing. She tolerates methylprednisolone  . Iodine Anaphylaxis  . Codeine Hives and Nausea And Vomiting  . Shellfish Allergy Hives  . Penicillins Hives and Rash     PERTINENT MEDICATIONS:  Outpatient Encounter Medications as of 08/14/2019  Medication Sig  . albuterol (PROVENTIL) (2.5 MG/3ML) 0.083% nebulizer solution Take 3 mLs (2.5 mg total) by nebulization 4 (four) times daily.  Marland Kitchen albuterol (VENTOLIN HFA) 108 (90 Base) MCG/ACT inhaler INHALE 2 PUFFS INTO THE LUNGS EVERY 6 HOURS AS NEEDED FOR WHEEZING OR SHORTNESS OF BREATH  . amLODipine (NORVASC) 10 MG tablet Take 1 tablet (10 mg total) by mouth daily.  Marland Kitchen aspirin EC 81 MG tablet Take 81 mg by mouth daily.  Marland Kitchen atorvastatin (LIPITOR) 40 MG tablet TAKE 1 TABLET(40 MG) BY MOUTH DAILY  . budesonide (PULMICORT) 0.5 MG/2ML nebulizer solution Take 2 mLs (0.5 mg total) by nebulization 2 (two) times daily.  . cetirizine (ZYRTEC) 10 MG tablet Take 1 tablet (10 mg total) by mouth daily.  . cyclobenzaprine (FLEXERIL) 10 MG tablet TAKE 1 TABLET BY MOUTH 2 TIMES DAILY AS NEEDED FOR MUSCLE SPASMS (Patient taking  differently: Take 10 mg by mouth 2 (two) times daily as needed for muscle spasms. )  . formoterol (PERFOROMIST) 20 MCG/2ML nebulizer solution Take 2 mLs (20 mcg total) by nebulization 2 (two) times daily.  Marland Kitchen guaiFENesin (MUCINEX) 600 MG 12 hr tablet Take 2 tablets (1,200 mg total) by mouth 2 (two) times daily as needed for cough or to loosen phlegm.  . hydrOXYzine (ATARAX/VISTARIL) 25 MG tablet Take 1 tablet (25 mg total) by mouth 3 (three) times daily as needed for anxiety. (Patient taking differently: Take 20 mg by mouth 3 (three) times daily as needed for anxiety. )  . Morphine Sulfate (MORPHINE CONCENTRATE) 10 MG/0.5ML SOLN concentrated solution Take 0.25 mLs (5 mg total) by mouth every 4 (four) hours as needed for severe pain or shortness of breath.  . Multiple Vitamin (MULTIVITAMIN WITH MINERALS) TABS tablet Take 1 tablet by mouth daily.  . nicotine (NICODERM CQ - DOSED IN MG/24 HOURS)  21 mg/24hr patch Place 1 patch (21 mg total) onto the skin daily.  Marland Kitchen omeprazole (PRILOSEC OTC) 20 MG tablet Take 1 tablet (20 mg total) by mouth daily.  . predniSONE (DELTASONE) 10 MG tablet Take 4 tabs once daily x 3 days, then 3 tabs once daily for 3 days, then 2 tabs once daily for 3 days, then 1 tab once daily for 3 days, then OFF.  Marland Kitchen sertraline (ZOLOFT) 100 MG tablet Take 1 tablet (100 mg total) by mouth daily.   No facility-administered encounter medications on file as of 08/14/2019.    PHYSICAL EXAM / ROS:   Current and past weights: 137 lbs reported, down about 5 lbs. Usual wt is 120 lbs.  General: NAD, frail appearing, thin Cardiovascular:++  chest pain reported, no LE edema  Pulmonary: no cough, ++ increased SOB, DOE. oxygen at 3-4 L, usual sat is 94-98%. Abdomen: appetite good, denies  constipation, continent of bowel GU: denies dysuria, continent of urine MSK:  no joint and ROM abnormalities, ambulatory with walker, denies falls Skin: no rashes or wounds reported Neurological: Weakness,  endorses pain, insomnia  Jason Coop, NP , DNP, MPH, [facility]  COVID-19 PATIENT SCREENING TOOL  Person answering questions: ___________Maryann______ _____   1.  Is the patient or any family member in the home showing any signs or symptoms regarding respiratory infection?               Person with Symptom- __________NA_________________  a. Fever                                                                          Yes___ No___          ___________________  b. Shortness of breath                                                    Yes___ No___          ___________________ c. Cough/congestion                                       Yes___  No___         ___________________ d. Body aches/pains                                                         Yes___ No___        ____________________ e. Gastrointestinal symptoms (diarrhea, nausea)           Yes___ No___        ____________________  2. Within the past 14 days, has anyone living in the home had any contact with someone with or under investigation for COVID-19?    Yes___ No_X_   Person __________________

## 2019-08-14 NOTE — Progress Notes (Signed)
Valora Corporal!       There were some red flag concerns regarding her controlled substances before she was hospitalized (spilled morphine, the roommate requesting refills for her and then saying oh by the way her oxygen is in the 50-60's) and I tried to offer to change from liquid morphine to MS Contin 15 q12 hours as recommended by palliative care at the visit yesterday.  Patient reported would think about it.  I'd be interested in switching her to this and asking the pharmacy if they have blister packs, which would make it easier to avoid diversion.       Her sister is in from [place] and appears to have a good control of the medications and treatments, unsure how long she will be staying. Thanks again, [employer]

## 2019-08-16 ENCOUNTER — Telehealth: Payer: Self-pay

## 2019-08-16 NOTE — Telephone Encounter (Signed)
I sent in a refill on 08/13/2019.  Will not refill until its due.

## 2019-08-16 NOTE — Telephone Encounter (Signed)
   Copied from CRM [id number]. Topic: General - Other >> Aug 16, 2019 12:59 PM Daphine Deutscher D wrote: Reason for CRM: Pt's mom called saying the patient is going to be out of the Morphine before the pharmacy tells her they can refill it.  She will need it by the 27th  CB#  [phone]

## 2019-08-16 NOTE — Telephone Encounter (Signed)
Hi Katie,     Patient had a refill of liquid morphine on 08/13/2019.  Received call from patient's sister notifying that patient will run out of medication a few days before her next fill date that she is taking the medications every 4-5 hours, which is more often than just reported during my last visit with her or when you last visited with her in the home.  I am concerned with this and would like to discuss your original recommendation of switching to the blister packs of MS Contin 15mg  q12.  Are we able to discuss early next week? Thanks, 

## 2019-08-16 NOTE — Telephone Encounter (Signed)
I contacted the patient sister and she informed me that the patient is gong to run out the medication 7 days before her fill date. She said the patient is currently taking it on most days every 4hrs, but some times she can go 5 hrs. She said she is not get the right qty to last her 30 days.

## 2019-08-19 ENCOUNTER — Ambulatory Visit: Payer: Self-pay | Admitting: General Practice

## 2019-08-19 ENCOUNTER — Other Ambulatory Visit: Payer: Self-pay | Admitting: Family Medicine

## 2019-08-19 ENCOUNTER — Ambulatory Visit: Payer: Self-pay | Admitting: Pharmacist

## 2019-08-19 ENCOUNTER — Telehealth: Payer: Medicaid Other | Admitting: General Practice

## 2019-08-19 DIAGNOSIS — J441 Chronic obstructive pulmonary disease with (acute) exacerbation: Secondary | ICD-10-CM

## 2019-08-19 DIAGNOSIS — J439 Emphysema, unspecified: Secondary | ICD-10-CM

## 2019-08-19 DIAGNOSIS — R0602 Shortness of breath: Secondary | ICD-10-CM

## 2019-08-19 DIAGNOSIS — J9611 Chronic respiratory failure with hypoxia: Secondary | ICD-10-CM

## 2019-08-19 DIAGNOSIS — Z515 Encounter for palliative care: Secondary | ICD-10-CM

## 2019-08-19 DIAGNOSIS — F419 Anxiety disorder, unspecified: Secondary | ICD-10-CM

## 2019-08-19 DIAGNOSIS — I10 Essential (primary) hypertension: Secondary | ICD-10-CM

## 2019-08-19 MED ORDER — MORPHINE SULFATE (CONCENTRATE) 10 MG/0.5ML PO SOLN: 5.0000 mg | ORAL | 0 refills | Status: DC | PRN

## 2019-08-19 NOTE — Chronic Care Management (AMB) (Signed)
Care Management   Follow Up Note   08/19/2019 Name: Emiline Mancebo MRN: [id number] DOB: 1960-10-11  Referred by: [name], FNP Reason for referral : Chronic Care Management (Patient Phone Call) and Care Coordination [facility])   Finlay Mills is a 59 y.o. year old female who is a primary care patient of [name], FNP. The care management team was consulted for assistance with care management and care coordination needs.    Review of patient status, including review of consultants reports, relevant laboratory and other test results, and collaboration with appropriate care team members and the patient's provider was performed as part of comprehensive patient evaluation and provision of chronic care management services.    SDOH (Social Determinants of Health) assessments performed: No See Care Plan activities for detailed interventions related to [facility])     Advanced Directives: See Care Plan and [employer] application for related entries.   Goals Addressed            This Visit's Progress   . PharmD - Medication Management       CARE PLAN ENTRY (see longitudinal plan of care for additional care plan information)   Current Barriers:  . Chronic Disease Management support, education, and care coordination needs related to COPD on oxygen, HTN, depression/anxiety, HLD and GERD  Pharmacist Clinical Goal(s):  Marland Kitchen Over the next 30 days, patient will work with CM Pharmacist and PCP to address needs related to medication adherence  Interventions: . Collaborate with PCP.  o Patient's sister contacted clinic on 7/23 concerned patient's supply of morphine liquid would not last until next refill o Note at 7/20 office visit PCP discussed with patient a switch from morphine liquid to MS Contin 15 mg q12 hours for easier dosing/administration. . Perform review of dispensing record in chart. Patient last received refill of morphine sulfate 20 mg/mL liquid quantity of 30 mL for  20 day supply on 7/12. Next fill due on or after 8/1. Marland Kitchen Coordination of care call to [facility] and speak with RPh Korin. o Confirms received Rx from 7/20, which is currently on hold and pharmacy needing to confirm quantity of Rx from 7/20. Notes insurance limits fill to 34 day supply. Wendall Stade with PCP. Provider sends in updated Rx for morphine liquid to pharmacy for quantity of 30 mL (20 day supply).  Rushie Chestnut RPh confirms receive updated Rx from today to replace Rx from 7/20 and fill will be due on or after 8/1. Marland Kitchen Place follow up call to patient who asks that I speak with her sister, [name]. [name] expresses concern that patient's morphine Rx does not last patient 30 days. Counsel caregiver that morphine Rx from 7/12 was written for 20 day supply. Next fill due on or after 8/1. Caregiver states patient has enough medication to last until 8/1. o Counsel on option, as provided by PCP, of switching from current morphine liquid to extended release MS Contin tablet dosage form for ease of dosing/administration. Sister states that she believes patient is interested in this option for future refills but will discuss with her further and let provider know. . Follow up regarding medication adherence and weekly pillbox. Confirms obtained and using weekly pillbox for patient's other medications. o Counsel on pill package as aid. Caregiver denies interest in option at this time. . Will follow up with patient/caregiver to complete medication review as previously scheduled.  Patient Self Care Activities:  . Calls provider office for new concerns or questions . Unable to self  administer medications as prescribed. Sister currently managing medications for patient  Please see past updates related to this goal by clicking on the "Past Updates" button in the selected goal         Plan  Telephone follow up appointment with care management team member scheduled for: 8/6 at 9 am  Duanne Moron,  PharmD, [facility] Clinical Pharmacist [facility] [phone]

## 2019-08-19 NOTE — Patient Instructions (Signed)
Thank you allowing the Care Management Team to be a part of your care! It was a pleasure speaking with you today!     Care Management Team    Alto Denver RN, MSN, CCM Nurse Care Coordinator  [phone]   Duanne Moron PharmD  Clinical Pharmacist  [phone]   Dickie La LCSW Clinical Social Worker [phone]   Visit Information  Goals Addressed            This Visit's Progress   . PharmD - Medication Management       CARE PLAN ENTRY (see longitudinal plan of care for additional care plan information)   Current Barriers:  . Chronic Disease Management support, education, and care coordination needs related to COPD on oxygen, HTN, depression/anxiety, HLD and GERD  Pharmacist Clinical Goal(s):  Marland Kitchen Over the next 30 days, patient will work with CM Pharmacist and PCP to address needs related to medication adherence  Interventions: . Collaborate with PCP.  o Patient's sister contacted clinic on 7/23 concerned patient's supply of morphine liquid would not last until next refill o Note at 7/20 office visit PCP discussed with patient a switch from morphine liquid to MS Contin 15 mg q12 hours for easier dosing/administration. . Perform review of dispensing record in chart. Patient last received refill of morphine sulfate 20 mg/mL liquid quantity of 30 mL for 20 day supply on 7/12. Next fill due on or after 8/1. Marland Kitchen Coordination of care call to [facility] and speak with RPh Korin. o Confirms received Rx from 7/20, which is currently on hold and pharmacy needing to confirm quantity of Rx from 7/20. Notes insurance limits fill to 34 day supply. Wendall Stade with PCP. Provider sends in updated Rx for morphine liquid to pharmacy for quantity of 30 mL (20 day supply).  Rushie Chestnut RPh confirms receive updated Rx from today to replace Rx from 7/20 and fill will be due on or after 8/1. Marland Kitchen Place follow up call to patient who asks that I speak with her sister, [name]. [name]  expresses concern that patient's morphine Rx does not last patient 30 days. Counsel caregiver that morphine Rx from 7/12 was written for 20 day supply. Next fill due on or after 8/1. Caregiver states patient has enough medication to last until 8/1. o Counsel on option, as provided by PCP, of switching from current morphine liquid to extended release MS Contin tablet dosage form for ease of dosing/administration. Sister states that she believes patient is interested in this option for future refills but will discuss with her further and let provider know. . Follow up regarding medication adherence and weekly pillbox. Confirms obtained and using weekly pillbox for patient's other medications. o Counsel on pill package as aid. Caregiver denies interest in option at this time. . Will follow up with patient/caregiver to complete medication review as previously scheduled.  Patient Self Care Activities:  . Calls provider office for new concerns or questions . Unable to self administer medications as prescribed. Sister currently managing medications for patient  Please see past updates related to this goal by clicking on the "Past Updates" button in the selected goal         Patient verbalizes understanding of instructions provided today.   The care management team will reach out to the patient again over the next 30 days.   Duanne Moron, PharmD, [facility] Clinical Pharmacist [facility] [employer] [phone]

## 2019-08-19 NOTE — Patient Instructions (Signed)
Visit Information  Goals Addressed              This Visit's Progress     RNCM: Pt-"I am setting goals for myself" (pt-stated)        CARE PLAN ENTRY (see longtitudinal plan of care for additional care plan information)  Current Barriers:   Chronic Disease Management support, education, and care coordination needs related to HTN, COPD, Anxiety, Depression, and Chronic pain  Clinical Goal(s) related to HTN, COPD, Anxiety, Depression, and Chronic Pain :  Over the next 120 days, patient will:   Work with the care management team to address educational, disease management, and care coordination needs   Begin or continue self health monitoring activities as directed today Measure and record blood pressure 2/3 times per week and adhere to a heart healthy diet  Call provider office for new or worsened signs and symptoms Blood pressure findings outside established parameters, Oxygen saturation lower than established parameter, Chest pain, Shortness of breath, and New or worsened symptom related to COPD, depression, anxiety, and other chronic conditions   Call care management team with questions or concerns  Verbalize basic understanding of patient centered plan of care established today  Interventions related to HTN, COPD, Anxiety, Depression, and chronic pain :   Evaluation of current treatment plans and patient's adherence to plan as established by provider.  The patient is setting small goals for herself. She was able to cook supper last night and she is learning to pace her activities. She is trying to not take as much of the morphine and using alternate methods from pain relief when she can.   Assessed patient understanding of disease states.  Has good understanding of her chronic conditions. Her roommate is on vacation right now so her older sister is with her. She says she has a good support system. She is using patches to help her with smoking cessation. She has only had one  cigarette since being out of the hospital. She says the patches are effective.   Assessed patient's education and care coordination needs.  Reviewed available resources in the community. The patient states she has adequate food resources at this time. She will let the [facility] know if there are changes.   Provided disease specific education to patient. Education on the benefits of taking blood pressures and recording. She states her blood pressure is up when she is having pain exacerbations. Also discussed dietary habits and following a heart healthy diet.   Collaborated with appropriate clinical care team members regarding patient needs.  The patient works with the pharmacist. Ronnald Nian the LCSW is available if needed. Declines any needs at this time.  Assessed upcoming appointments. Has a follow up with pcp on 08-27-2019 and next outreach from [facility] 09-16-2019 at 1 pm   Patient Self Care Activities related to HTN, COPD, Anxiety, Depression, and Chronic pain :   Patient is unable to independently self-manage chronic health conditions  Initial goal documentation        Patient verbalizes understanding of instructions provided today.   Telephone follow up appointment with care management team member scheduled for: 09-16-2019 at 1 pm  Alto Denver RN, MSN, CCM Community Care Coordinator [facility]   [facility] [place] Mobile: [phone]

## 2019-08-19 NOTE — Chronic Care Management (AMB) (Signed)
Care Management   Initial Visit Note  08/19/2019 Name: Alicia Gibson MRN: [id number] DOB: [birth year]  Subjective:   Objective:  BP Readings from Last 3 Encounters:  08/13/19 121/70  08/04/19 (!) 143/87  07/13/19 (!) 146/90    Assessment: Emiliana Blaize is a 59 y.o. year old female who sees Malfi, Jodelle Gross, FNP for primary care. The care management team was consulted for assistance with care management and care coordination needs related to Disease Management Educational Needs Care Coordination.   Review of patient status, including review of consultants reports, relevant laboratory and other test results, and collaboration with appropriate care team members and the patient's provider was performed as part of comprehensive patient evaluation and provision of care management services.    SDOH (Social Determinants of Health) assessments performed: Yes See Care Plan activities for detailed interventions related to SDOH)  SDOH Interventions     Most Recent Value  SDOH Interventions  Financial Strain Interventions Other (Comment)  [Knows about resources]  Physical Activity Interventions Other (Comments)  Social Connections Interventions Other (Comment)  [Has a strong faith. The patient also has a good support system.]  Depression Interventions/Treatment  --  [The patient has good support system.  Knows about resources]       Outpatient Encounter Medications as of 08/19/2019  Medication Sig  . albuterol (PROVENTIL) (2.5 MG/3ML) 0.083% nebulizer solution Take 3 mLs (2.5 mg total) by nebulization 4 (four) times daily.  Marland Kitchen albuterol (VENTOLIN HFA) 108 (90 Base) MCG/ACT inhaler INHALE 2 PUFFS INTO THE LUNGS EVERY 6 HOURS AS NEEDED FOR WHEEZING OR SHORTNESS OF BREATH  . amLODipine (NORVASC) 10 MG tablet Take 1 tablet (10 mg total) by mouth daily.  Marland Kitchen aspirin EC 81 MG tablet Take 81 mg by mouth daily.  Marland Kitchen atorvastatin (LIPITOR) 40 MG tablet TAKE 1 TABLET(40 MG) BY MOUTH DAILY  . budesonide  (PULMICORT) 0.5 MG/2ML nebulizer solution Take 2 mLs (0.5 mg total) by nebulization 2 (two) times daily.  . cetirizine (ZYRTEC) 10 MG tablet Take 1 tablet (10 mg total) by mouth daily.  . cyclobenzaprine (FLEXERIL) 10 MG tablet TAKE 1 TABLET BY MOUTH 2 TIMES DAILY AS NEEDED FOR MUSCLE SPASMS (Patient taking differently: Take 10 mg by mouth 2 (two) times daily as needed for muscle spasms. )  . formoterol (PERFOROMIST) 20 MCG/2ML nebulizer solution Take 2 mLs (20 mcg total) by nebulization 2 (two) times daily.  Marland Kitchen guaiFENesin (MUCINEX) 600 MG 12 hr tablet Take 2 tablets (1,200 mg total) by mouth 2 (two) times daily as needed for cough or to loosen phlegm.  . hydrOXYzine (ATARAX/VISTARIL) 25 MG tablet Take 1 tablet (25 mg total) by mouth 3 (three) times daily as needed for anxiety. (Patient taking differently: Take 20 mg by mouth 3 (three) times daily as needed for anxiety. )  . Morphine Sulfate (MORPHINE CONCENTRATE) 10 MG/0.5ML SOLN concentrated solution Take 0.25 mLs (5 mg total) by mouth every 4 (four) hours as needed for severe pain or shortness of breath.  . Multiple Vitamin (MULTIVITAMIN WITH MINERALS) TABS tablet Take 1 tablet by mouth daily.  . nicotine (NICODERM CQ - DOSED IN MG/24 HOURS) 21 mg/24hr patch Place 1 patch (21 mg total) onto the skin daily.  Marland Kitchen omeprazole (PRILOSEC OTC) 20 MG tablet Take 1 tablet (20 mg total) by mouth daily.  . predniSONE (DELTASONE) 10 MG tablet Take 4 tabs once daily x 3 days, then 3 tabs once daily for 3 days, then 2 tabs once daily for 3 days, then  1 tab once daily for 3 days, then OFF.  Marland Kitchen sertraline (ZOLOFT) 100 MG tablet Take 1 tablet (100 mg total) by mouth daily.   No facility-administered encounter medications on file as of 08/19/2019.    Goals Addressed              This Visit's Progress   .  RNCM: Pt-"I am setting goals for myself" (pt-stated)        CARE PLAN ENTRY (see longtitudinal plan of care for additional care plan information)  Current  Barriers:  . Chronic Disease Management support, education, and care coordination needs related to HTN, COPD, Anxiety, Depression, and Chronic pain  Clinical Goal(s) related to HTN, COPD, Anxiety, Depression, and Chronic Pain :  Over the next 120 days, patient will:  . Work with the care management team to address educational, disease management, and care coordination needs  . Begin or continue self health monitoring activities as directed today Measure and record blood pressure 2/3 times per week and adhere to a heart healthy diet . Call provider office for new or worsened signs and symptoms Blood pressure findings outside established parameters, Oxygen saturation lower than established parameter, Chest pain, Shortness of breath, and New or worsened symptom related to COPD, depression, anxiety, and other chronic conditions  . Call care management team with questions or concerns . Verbalize basic understanding of patient centered plan of care established today  Interventions related to HTN, COPD, Anxiety, Depression, and chronic pain :  . Evaluation of current treatment plans and patient's adherence to plan as established by provider.  The patient is setting small goals for herself. She was able to cook supper last night and she is learning to pace her activities. She is trying to not take as much of the morphine and using alternate methods from pain relief when she can.  . Assessed patient understanding of disease states.  Has good understanding of her chronic conditions. Her roommate is on vacation right now so her older sister is with her. She says she has a good support system. She is using patches to help her with smoking cessation. She has only had one cigarette since being out of the hospital. She says the patches are effective.  . Assessed patient's education and care coordination needs.  Reviewed available resources in the community. The patient states she has adequate food resources at this  time. She will let the [facility] know if there are changes.  . Provided disease specific education to patient. Education on the benefits of taking blood pressures and recording. She states her blood pressure is up when she is having pain exacerbations. Also discussed dietary habits and following a heart healthy diet.  Steele Sizer with appropriate clinical care team members regarding patient needs.  The patient works with the pharmacist. Ronnald Nian the LCSW is available if needed. Declines any needs at this time. . Assessed upcoming appointments. Has a follow up with pcp on 08-27-2019 and next outreach from [facility] 09-16-2019 at 1 pm   Patient Self Care Activities related to HTN, COPD, Anxiety, Depression, and Chronic pain :  . Patient is unable to independently self-manage chronic health conditions  Initial goal documentation         Follow up plan:  Telephone follow up appointment with care management team member scheduled for: 09-16-2019 at 1 pm  Ms. Perry was given information about Care Management services today including:  1. Care Management services include personalized support from designated clinical staff supervised by a  physician, including individualized plan of care and coordination with other care providers 2. 24/7 contact phone numbers for assistance for urgent and routine care needs. 3. The patient may stop Care Management services at any time (effective at the end of the month) by phone call to the office staff.  Patient agreed to services and verbal consent obtained.  Alto Denver RN, MSN, CCM Community Care Coordinator [place]  [facility] [place] Mobile: [phone]

## 2019-08-21 ENCOUNTER — Other Ambulatory Visit: Payer: Self-pay | Admitting: Nurse Practitioner

## 2019-08-21 DIAGNOSIS — I10 Essential (primary) hypertension: Secondary | ICD-10-CM

## 2019-08-22 ENCOUNTER — Telehealth: Payer: Self-pay

## 2019-08-22 NOTE — Telephone Encounter (Signed)
Yes, would plan to see her every 3 months, especially on the medications that I have been prescribing.  We would likely need to see her within the next month to have a pain management contract signed.

## 2019-08-22 NOTE — Telephone Encounter (Signed)
Copied from CRM [id number]. Topic: General - Inquiry >> Aug 22, 2019 11:38 AM Leary Roca wrote: Reason for CRM: Pt called in states she was just seen for a follow up and wanted to know if she still needs to keep her 3 month follow up appt on Aug 3rd . Please reach out to pt

## 2019-08-27 ENCOUNTER — Ambulatory Visit: Payer: Medicaid Other | Admitting: Family Medicine

## 2019-08-27 ENCOUNTER — Ambulatory Visit (INDEPENDENT_AMBULATORY_CARE_PROVIDER_SITE_OTHER): Payer: Medicaid Other | Admitting: Family Medicine

## 2019-08-27 ENCOUNTER — Encounter: Payer: Self-pay | Admitting: Family Medicine

## 2019-08-27 ENCOUNTER — Other Ambulatory Visit: Payer: Self-pay

## 2019-08-27 VITALS — HR 90

## 2019-08-27 DIAGNOSIS — J9611 Chronic respiratory failure with hypoxia: Secondary | ICD-10-CM

## 2019-08-27 DIAGNOSIS — J449 Chronic obstructive pulmonary disease, unspecified: Secondary | ICD-10-CM

## 2019-08-27 DIAGNOSIS — J4489 Other specified chronic obstructive pulmonary disease: Secondary | ICD-10-CM

## 2019-08-27 DIAGNOSIS — J439 Emphysema, unspecified: Secondary | ICD-10-CM | POA: Diagnosis not present

## 2019-08-27 NOTE — Assessment & Plan Note (Signed)
Patient requesting to begin long term prednisone therapy because her breathing is better while on this.  Discussed would need to discuss with her pulmonology physician and palliative care team their thoughts on this treatment plan and we can circle back to it.  Discussed need for signed pain management contract due to her prescriptions while on palliative care.  Patient interested in switching to MS Contin from liquid morphine at next refill.  Discussed needs to come in before next refill and we will fill out pain management contract.  Patient in agreement  Plan: 1. Patient to call pulmonary for follow up visit 2. FNP to contact pulmonary team/palliative care team for input on patient request for long term steroids

## 2019-08-27 NOTE — Assessment & Plan Note (Signed)
See chronic bronchitis with COPD A/P

## 2019-08-27 NOTE — Patient Instructions (Signed)
Continue all medications as directed  We will plan to see you back in 3-4 weeks for COPD follow up visit and to sign pain management contract  You will receive a survey after today's visit either digitally by e-mail or paper by [employer] mail. Your experiences and feedback matter to [name].  Please respond so we know how we are doing as we provide care for you.  Call us with any questions/concerns/needs.  It is my goal to be available to you for your health concerns.  Thanks for choosing me to be a partner in your healthcare needs!  Charlaine Dalton, FNP-C Family Nurse Practitioner [facility] Phone: [phone]

## 2019-08-27 NOTE — Progress Notes (Signed)
Virtual Visit via Telephone  The purpose of this virtual visit is to provide medical care while limiting exposure to the novel coronavirus (COVID19) for both patient and office staff.  Consent was obtained for phone visit:  Yes.   Answered questions that patient had about telehealth interaction:  Yes.   I discussed the limitations, risks, security and privacy concerns of performing an evaluation and management service by telephone. I also discussed with the patient that there may be a patient responsible charge related to this service. The patient expressed understanding and agreed to proceed.  Patient is at home and is accessed via telephone Services are provided by Charlaine Dalton, FNP-C from [facility])  ---------------------------------------------------------------------- Chief Complaint  Patient presents with  . COPD    chronic end stage COPD. Pt have some concerns about her Prednisone. She stte every time she finish it up. She seems to get worse after stopping it. She want to know if she should continue on the medication.    S: Reviewed CMA documentation. I have called patient and gathered additional HPI as follows:  Alicia Gibson presents for telemedicine visit to discuss concerns for prednisone.  Reports she believes her breathing is better on her prednisone and after she has been off of it for a week she begins to have worsening of her COPD symptoms.  Is interested in going onto prednisone long term.  Discussed we would need to have input from her pulmonology physician and her palliative care team for updating this treatment plan.  Patient verbalized understanding.  Patient is currently home Denies any high risk travel to areas of current concern for COVID19. Denies any known or suspected exposure to person with or possibly with COVID19.  Denies any fevers, chills, sweats, body ache, cough, shortness of breath, sinus pain or pressure, headache, abdominal  pain, diarrhea  Past Medical History:  Diagnosis Date  . Allergy   . Anxiety   . Colitis    recurrent  . COPD (chronic obstructive pulmonary disease) (HCC)   . Depression   . Graves disease    s/p iodine radiation treatment - euthyroid for many years per pt  . Hyperlipidemia   . Hypertension   . Loss of eye, LEFT 2007   Social History   Tobacco Use  . Smoking status: Former Smoker    Packs/day: 0.10    Types: Cigarettes  . Smokeless tobacco: Never Used  Vaping Use  . Vaping Use: Never used  Substance Use Topics  . Alcohol use: Never  . Drug use: Never    Current Outpatient Medications:  .  albuterol (PROVENTIL) (2.5 MG/3ML) 0.083% nebulizer solution, Take 3 mLs (2.5 mg total) by nebulization 4 (four) times daily., Disp: 75 mL, Rfl: 3 .  albuterol (VENTOLIN HFA) 108 (90 Base) MCG/ACT inhaler, INHALE 2 PUFFS INTO THE LUNGS EVERY 6 HOURS AS NEEDED FOR WHEEZING OR SHORTNESS OF BREATH, Disp: 25.5 g, Rfl: 1 .  amLODipine (NORVASC) 10 MG tablet, Take 1 tablet (10 mg total) by mouth daily., Disp: 30 tablet, Rfl: 0 .  aspirin EC 81 MG tablet, Take 81 mg by mouth daily., Disp: , Rfl:  .  atorvastatin (LIPITOR) 40 MG tablet, TAKE 1 TABLET(40 MG) BY MOUTH DAILY, Disp: 30 tablet, Rfl: 2 .  budesonide (PULMICORT) 0.5 MG/2ML nebulizer solution, Take 2 mLs (0.5 mg total) by nebulization 2 (two) times daily., Disp: 1440 mL, Rfl: 10 .  cetirizine (ZYRTEC) 10 MG tablet, Take 1 tablet (10 mg total)  by mouth daily., Disp: 30 tablet, Rfl: 5 .  cyclobenzaprine (FLEXERIL) 10 MG tablet, TAKE 1 TABLET BY MOUTH 2 TIMES DAILY AS NEEDED FOR MUSCLE SPASMS (Patient taking differently: Take 10 mg by mouth 2 (two) times daily as needed for muscle spasms. ), Disp: 60 tablet, Rfl: 5 .  formoterol (PERFOROMIST) 20 MCG/2ML nebulizer solution, Take 2 mLs (20 mcg total) by nebulization 2 (two) times daily., Disp: 1440 mL, Rfl: 10 .  guaiFENesin (MUCINEX) 600 MG 12 hr tablet, Take 2 tablets (1,200 mg total) by mouth  2 (two) times daily as needed for cough or to loosen phlegm., Disp: 120 tablet, Rfl: 2 .  hydrOXYzine (ATARAX/VISTARIL) 25 MG tablet, Take 1 tablet (25 mg total) by mouth 3 (three) times daily as needed for anxiety. (Patient taking differently: Take 20 mg by mouth 3 (three) times daily as needed for anxiety. ), Disp: 45 tablet, Rfl: 0 .  Morphine Sulfate (MORPHINE CONCENTRATE) 10 MG/0.5ML SOLN concentrated solution, Take 0.25 mLs (5 mg total) by mouth every 4 (four) hours as needed for severe pain or shortness of breath., Disp: 30 mL, Rfl: 0 .  Multiple Vitamin (MULTIVITAMIN WITH MINERALS) TABS tablet, Take 1 tablet by mouth daily., Disp: , Rfl:  .  nicotine (NICODERM CQ - DOSED IN MG/24 HOURS) 21 mg/24hr patch, Place 1 patch (21 mg total) onto the skin daily., Disp: 28 patch, Rfl: 0 .  omeprazole (PRILOSEC) 20 MG capsule, Take by mouth daily., Disp: , Rfl:  .  sertraline (ZOLOFT) 100 MG tablet, Take 1 tablet (100 mg total) by mouth daily., Disp: 90 tablet, Rfl: 1 .  predniSONE (DELTASONE) 10 MG tablet, Take 4 tabs once daily x 3 days, then 3 tabs once daily for 3 days, then 2 tabs once daily for 3 days, then 1 tab once daily for 3 days, then OFF. (Patient not taking: Reported on 08/27/2019), Disp: 30 tablet, Rfl: 0  Depression screen [facility] 2/9 08/19/2019 05/27/2019 11/13/2018  Decreased Interest 1 1 0  Down, Depressed, Hopeless 1 1 0  PHQ - 2 Score 2 2 0  Altered sleeping 1 1 1   Tired, decreased energy 1 1 1   Change in appetite 0 0 0  Feeling bad or failure about yourself  0 0 0  Trouble concentrating 0 0 0  Moving slowly or fidgety/restless 0 1 0  Suicidal thoughts 0 0 0  PHQ-9 Score 4 5 2   Difficult doing work/chores Not difficult at all Not difficult at all Not difficult at all    GAD 7 : Generalized Anxiety Score 08/13/2018 05/14/2018  Nervous, Anxious, on Edge 1 2  Control/stop worrying 0 3  Worry too much - different things 1 3  Trouble relaxing 0 2  Restless 0 0  Easily annoyed or  irritable 0 2  Afraid - awful might happen 0 3  Total GAD 7 Score 2 15  Anxiety Difficulty Not difficult at all Not difficult at all    -------------------------------------------------------------------------- O: No physical exam performed due to remote telephone encounter.  Physical Exam: Patient remotely monitored without video.  Verbal communication appropriate.  Cognition normal.  Recent Results (from the past 2160 hour(s))  CBC with Differential     Status: Abnormal   Collection Time: 06/21/19 11:23 PM  Result Value Ref Range   WBC 13.7 (H) 4.0 - 10.5 K/uL   RBC 4.63 3.87 - 5.11 MIL/uL   Hemoglobin 13.8 12.0 - 15.0 g/dL   HCT 65.7 36 - 46 %   MCV  93.5 80.0 - 100.0 fL   MCH 29.8 26.0 - 34.0 pg   MCHC 31.9 30.0 - 36.0 g/dL   RDW 16.1 09.6 - 04.5 %   Platelets 272 150 - 400 K/uL   nRBC 0.0 0.0 - 0.2 %   Neutrophils Relative % 83 %   Neutro Abs 11.5 (H) 1.7 - 7.7 K/uL   Lymphocytes Relative 7 %   Lymphs Abs 1.0 0.7 - 4.0 K/uL   Monocytes Relative 8 %   Monocytes Absolute 1.1 (H) 0 - 1 K/uL   Eosinophils Relative 1 %   Eosinophils Absolute 0.1 0 - 0 K/uL   Basophils Relative 0 %   Basophils Absolute 0.1 0 - 0 K/uL   Immature Granulocytes 1 %   Abs Immature Granulocytes 0.07 0.00 - 0.07 K/uL    Comment: Performed at [facility], [address] [place], [place] [address]  Basic metabolic panel     Status: Abnormal   Collection Time: 06/21/19 11:23 PM  Result Value Ref Range   Sodium 134 (L) 135 - 145 mmol/L   Potassium 3.9 3.5 - 5.1 mmol/L   Chloride 83 (L) 98 - 111 mmol/L   CO2 39 (H) 22 - 32 mmol/L   Glucose, Bld 126 (H) 70 - 99 mg/dL    Comment: Glucose reference range applies only to samples taken after fasting for at least 8 hours.   BUN 16 6 - 20 mg/dL   Creatinine, Ser 1.91 0.44 - 1.00 mg/dL   Calcium 9.8 8.9 - 47.8 mg/dL   GFR calc non Af Amer >60 >60 mL/min   GFR calc Af Amer >60 >60 mL/min   Anion gap 12 5 - 15    Comment: Performed at  [facility], [address] [place], [place] [address]  Troponin I (High Sensitivity)     Status: Abnormal   Collection Time: 06/21/19 11:23 PM  Result Value Ref Range   Troponin I (High Sensitivity) 70 (H) <18 ng/L    Comment: (NOTE) Elevated high sensitivity troponin I (hsTnI) values and significant  changes across serial measurements may suggest ACS but many other  chronic and acute conditions are known to elevate hsTnI results.  Refer to the "Links" section for chest pain algorithms and additional  guidance. Performed at [facility], [address] [place], [place] [address]   Culture, blood (routine x 2)     Status: None   Collection Time: 06/22/19 12:05 AM   Specimen: BLOOD  Result Value Ref Range   Specimen Description BLOOD Blood Culture adequate volume    Special Requests      BOTTLES DRAWN AEROBIC AND ANAEROBIC BLOOD LEFT FOREARM   Culture      NO GROWTH 5 DAYS Performed at [facility], [address], [place], [place] [address]    Report Status 06/27/2019 FINAL   SARS Coronavirus 2 by RT PCR (hospital order, performed in [facility] hospital lab) Nasopharyngeal Nasopharyngeal Swab     Status: None   Collection Time: 06/22/19 12:05 AM   Specimen: Nasopharyngeal Swab  Result Value Ref Range   SARS Coronavirus 2 NEGATIVE NEGATIVE    Comment: (NOTE) SARS-CoV-2 target nucleic acids are NOT DETECTED. The SARS-CoV-2 RNA is generally detectable in upper and lower respiratory specimens during the acute phase of infection. The lowest concentration of SARS-CoV-2 viral copies this assay can detect is 250 copies / mL. A negative result does not preclude SARS-CoV-2 infection and should not be used  as the sole basis for treatment or other patient management decisions.  A negative result may occur with improper specimen collection / handling, submission of specimen other than nasopharyngeal swab, presence of viral mutation(s) within the areas  targeted by this assay, and inadequate number of viral copies (<250 copies / mL). A negative result must be combined with clinical observations, patient history, and epidemiological information. Fact Sheet for Patients:   BoilerBrush.com.cy Fact Sheet for Healthcare Providers: https://pope.com/ This test is not yet approved or cleared  by the [place] FDA and has been authorized for detection and/or diagnosis of SARS-CoV-2 by FDA under an Emergency Use Authorization (EUA).  This EUA will remain in effect (meaning this test can be used) for the duration of the COVID-19 declaration under Section 564(b)(1) of the Act, 21 U.S.C. section 360bbb-3(b)(1), unless the authorization is terminated or revoked sooner. Performed at [facility], [address] [place], [place] [address]   Culture, blood (routine x 2)     Status: None   Collection Time: 06/22/19 12:10 AM   Specimen: BLOOD  Result Value Ref Range   Specimen Description BLOOD Blood Culture adequate volume    Special Requests      BOTTLES DRAWN AEROBIC AND ANAEROBIC RESISTANT WRIST   Culture      NO GROWTH 5 DAYS Performed at [facility], [address] [place], [place] [address]    Report Status 06/27/2019 FINAL   Lactic acid, plasma     Status: None   Collection Time: 06/22/19  1:00 AM  Result Value Ref Range   Lactic Acid, Venous 1.0 0.5 - 1.9 mmol/L    Comment: Performed at [facility], [address] [place], [place] [address]  Troponin I (High Sensitivity)     Status: Abnormal   Collection Time: 06/22/19  1:22 AM  Result Value Ref Range   Troponin I (High Sensitivity) 60 (H) <18 ng/L    Comment: (NOTE) Elevated high sensitivity troponin I (hsTnI) values and significant  changes across serial measurements may suggest ACS but many other  chronic and acute conditions are known to elevate hsTnI results.  Refer to the "Links" section for  chest pain algorithms and additional  guidance. Performed at [facility], [address] [place], [place] [address]   Comprehensive metabolic panel     Status: Abnormal   Collection Time: 06/22/19  9:24 AM  Result Value Ref Range   Sodium 135 135 - 145 mmol/L   Potassium 4.5 3.5 - 5.1 mmol/L   Chloride 84 (L) 98 - 111 mmol/L   CO2 40 (H) 22 - 32 mmol/L   Glucose, Bld 200 (H) 70 - 99 mg/dL    Comment: Glucose reference range applies only to samples taken after fasting for at least 8 hours.   BUN 21 (H) 6 - 20 mg/dL   Creatinine, Ser 9.62 0.44 - 1.00 mg/dL   Calcium 9.1 8.9 - 95.2 mg/dL   Total Protein 7.8 6.5 - 8.1 g/dL   Albumin 4.1 3.5 - 5.0 g/dL   AST 21 15 - 41 U/L   ALT 17 0 - 44 U/L   Alkaline Phosphatase 61 38 - 126 U/L   Total Bilirubin 0.6 0.3 - 1.2 mg/dL   GFR calc non Af Amer >60 >60 mL/min   GFR calc Af Amer >60 >60 mL/min   Anion gap 11 5 - 15    Comment: Performed at [facility], [address], [place], [place]  [address]  Magnesium     Status: None   Collection Time: 06/22/19  9:24 AM  Result Value Ref Range   Magnesium 2.1 1.7 - 2.4 mg/dL    Comment: Performed at [facility], [address] [place], [place] [address]  Phosphorus     Status: Abnormal   Collection Time: 06/22/19  9:24 AM  Result Value Ref Range   Phosphorus 5.7 (H) 2.5 - 4.6 mg/dL    Comment: Performed at [facility], [address] [place], [place] [address]  CBC with Differential/Platelet     Status: Abnormal   Collection Time: 06/22/19  9:24 AM  Result Value Ref Range   WBC 11.3 (H) 4.0 - 10.5 K/uL   RBC 4.33 3.87 - 5.11 MIL/uL   Hemoglobin 12.7 12.0 - 15.0 g/dL   HCT 91.4 36 - 46 %   MCV 93.8 80.0 - 100.0 fL   MCH 29.3 26.0 - 34.0 pg   MCHC 31.3 30.0 - 36.0 g/dL   RDW 78.2 95.6 - 21.3 %   Platelets 220 150 - 400 K/uL   nRBC 0.0 0.0 - 0.2 %   Neutrophils Relative % 96 %   Neutro Abs 10.8 (H) 1.7 - 7.7 K/uL   Lymphocytes Relative 2 %    Lymphs Abs 0.3 (L) 0.7 - 4.0 K/uL   Monocytes Relative 2 %   Monocytes Absolute 0.2 0 - 1 K/uL   Eosinophils Relative 0 %   Eosinophils Absolute 0.0 0 - 0 K/uL   Basophils Relative 0 %   Basophils Absolute 0.0 0 - 0 K/uL   Immature Granulocytes 0 %   Abs Immature Granulocytes 0.05 0.00 - 0.07 K/uL    Comment: Performed at [facility], [address] [place], [place] [address]  Lactic acid, plasma     Status: None   Collection Time: 06/22/19  9:24 AM  Result Value Ref Range   Lactic Acid, Venous 1.1 0.5 - 1.9 mmol/L    Comment: Performed at [facility], [address] [place], [place] [address]  Lactic acid, plasma     Status: None   Collection Time: 06/22/19 11:33 AM  Result Value Ref Range   Lactic Acid, Venous 1.2 0.5 - 1.9 mmol/L    Comment: Performed at [facility], [address] [place], [place] [address]  HIV Antibody (routine testing w rflx)     Status: None   Collection Time: 06/22/19 11:33 AM  Result Value Ref Range   HIV Screen 4th Generation wRfx Non Reactive Non Reactive    Comment: Performed at [facility], [address] [place], [place] [address]  Comprehensive metabolic panel     Status: Abnormal   Collection Time: 06/23/19  6:02 AM  Result Value Ref Range   Sodium 135 135 - 145 mmol/L   Potassium 3.7 3.5 - 5.1 mmol/L   Chloride 85 (L) 98 - 111 mmol/L   CO2 37 (H) 22 - 32 mmol/L   Glucose, Bld 102 (H) 70 - 99 mg/dL    Comment: Glucose reference range applies only to samples taken after fasting for at least 8 hours.   BUN 23 (H) 6 - 20 mg/dL   Creatinine, Ser 4.01 0.44 - 1.00 mg/dL   Calcium 9.3 8.9 - 02.7 mg/dL   Total Protein 6.9 6.5 - 8.1 g/dL   Albumin 3.6 3.5 - 5.0 g/dL   AST 18 15 - 41 U/L   ALT 14 0 -  44 U/L   Alkaline Phosphatase 53 38 - 126 U/L   Total Bilirubin 0.8 0.3 - 1.2 mg/dL   GFR calc non Af Amer >60 >60 mL/min   GFR calc Af Amer >60 >60 mL/min   Anion gap 13 5 - 15    Comment: Performed at  [facility], [address] [place], [place] [address]  CBC with Differential/Platelet     Status: Abnormal   Collection Time: 06/23/19  6:02 AM  Result Value Ref Range   WBC 11.0 (H) 4.0 - 10.5 K/uL   RBC 4.01 3.87 - 5.11 MIL/uL   Hemoglobin 11.8 (L) 12.0 - 15.0 g/dL   HCT 60.4 36 - 46 %   MCV 91.3 80.0 - 100.0 fL   MCH 29.4 26.0 - 34.0 pg   MCHC 32.2 30.0 - 36.0 g/dL   RDW 54.0 98.1 - 19.1 %   Platelets 229 150 - 400 K/uL   nRBC 0.0 0.0 - 0.2 %   Neutrophils Relative % 79 %   Neutro Abs 8.7 (H) 1.7 - 7.7 K/uL   Lymphocytes Relative 10 %   Lymphs Abs 1.1 0.7 - 4.0 K/uL   Monocytes Relative 9 %   Monocytes Absolute 1.0 0 - 1 K/uL   Eosinophils Relative 1 %   Eosinophils Absolute 0.1 0 - 0 K/uL   Basophils Relative 0 %   Basophils Absolute 0.0 0 - 0 K/uL   Immature Granulocytes 1 %   Abs Immature Granulocytes 0.05 0.00 - 0.07 K/uL    Comment: Performed at [facility], [address], [place], [place] [address]  Magnesium     Status: None   Collection Time: 06/23/19  6:02 AM  Result Value Ref Range   Magnesium 2.1 1.7 - 2.4 mg/dL    Comment: Performed at [facility], [address] [place], [place] [address]  Phosphorus     Status: None   Collection Time: 06/23/19  6:02 AM  Result Value Ref Range   Phosphorus 2.9 2.5 - 4.6 mg/dL    Comment: Performed at [facility], [address] [place], [place] [address]  Comprehensive metabolic panel     Status: Abnormal   Collection Time: 06/24/19  7:29 AM  Result Value Ref Range   Sodium 135 135 - 145 mmol/L   Potassium 3.8 3.5 - 5.1 mmol/L   Chloride 89 (L) 98 - 111 mmol/L   CO2 35 (H) 22 - 32 mmol/L   Glucose, Bld 97 70 - 99 mg/dL    Comment: Glucose reference range applies only to samples taken after fasting for at least 8 hours.   BUN 33 (H) 6 - 20 mg/dL   Creatinine, Ser 8.46 0.44 - 1.00 mg/dL   Calcium 9.2 8.9 - 96.2 mg/dL   Total Protein 6.3 (L) 6.5 - 8.1 g/dL   Albumin 3.3  (L) 3.5 - 5.0 g/dL   AST 30 15 - 41 U/L   ALT 19 0 - 44 U/L   Alkaline Phosphatase 70 38 - 126 U/L   Total Bilirubin 0.6 0.3 - 1.2 mg/dL   GFR calc non Af Amer >60 >60 mL/min   GFR calc Af Amer >60 >60 mL/min   Anion gap 11 5 - 15    Comment: Performed at [facility], [address], [place], [place] [address]  CBC with Differential/Platelet     Status: Abnormal   Collection Time: 06/24/19  7:29 AM  Result Value Ref Range  WBC 12.1 (H) 4.0 - 10.5 K/uL   RBC 4.08 3.87 - 5.11 MIL/uL   Hemoglobin 11.9 (L) 12.0 - 15.0 g/dL   HCT 91.4 36 - 46 %   MCV 89.0 80.0 - 100.0 fL   MCH 29.2 26.0 - 34.0 pg   MCHC 32.8 30.0 - 36.0 g/dL   RDW 78.2 95.6 - 21.3 %   Platelets 248 150 - 400 K/uL   nRBC 0.0 0.0 - 0.2 %   Neutrophils Relative % 75 %   Neutro Abs 9.3 (H) 1.7 - 7.7 K/uL   Lymphocytes Relative 13 %   Lymphs Abs 1.6 0.7 - 4.0 K/uL   Monocytes Relative 7 %   Monocytes Absolute 0.8 0 - 1 K/uL   Eosinophils Relative 3 %   Eosinophils Absolute 0.3 0 - 0 K/uL   Basophils Relative 1 %   Basophils Absolute 0.1 0 - 0 K/uL   Immature Granulocytes 1 %   Abs Immature Granulocytes 0.07 0.00 - 0.07 K/uL    Comment: Performed at [facility], [address], [place], [place] [address]  Magnesium     Status: None   Collection Time: 06/24/19  7:29 AM  Result Value Ref Range   Magnesium 2.0 1.7 - 2.4 mg/dL    Comment: Performed at [facility], [address] [place], [place] [address]  Phosphorus     Status: None   Collection Time: 06/24/19  7:29 AM  Result Value Ref Range   Phosphorus 2.6 2.5 - 4.6 mg/dL    Comment: Performed at [facility], [address] [place], [place] [address]  Comprehensive metabolic panel     Status: Abnormal   Collection Time: 06/25/19  4:10 AM  Result Value Ref Range   Sodium 140 135 - 145 mmol/L   Potassium 3.6 3.5 - 5.1 mmol/L   Chloride 95 (L) 98 - 111 mmol/L   CO2 37 (H) 22 - 32 mmol/L   Glucose, Bld 91 70 - 99  mg/dL    Comment: Glucose reference range applies only to samples taken after fasting for at least 8 hours.   BUN 27 (H) 6 - 20 mg/dL   Creatinine, Ser 4.13 0.44 - 1.00 mg/dL   Calcium 8.9 8.9 - 24.4 mg/dL   Total Protein 6.3 (L) 6.5 - 8.1 g/dL   Albumin 3.4 (L) 3.5 - 5.0 g/dL   AST 33 15 - 41 U/L   ALT 20 0 - 44 U/L   Alkaline Phosphatase 52 38 - 126 U/L   Total Bilirubin 0.5 0.3 - 1.2 mg/dL   GFR calc non Af Amer >60 >60 mL/min   GFR calc Af Amer >60 >60 mL/min   Anion gap 8 5 - 15    Comment: Performed at [facility], [address] [place], [place] [address]  CBC with Differential/Platelet     Status: Abnormal   Collection Time: 06/25/19  4:10 AM  Result Value Ref Range   WBC 8.9 4.0 - 10.5 K/uL   RBC 3.72 (L) 3.87 - 5.11 MIL/uL   Hemoglobin 10.9 (L) 12.0 - 15.0 g/dL   HCT 25.3 (L) 36 - 46 %   MCV 92.7 80.0 - 100.0 fL   MCH 29.3 26.0 - 34.0 pg   MCHC 31.6 30.0 - 36.0 g/dL   RDW 66.4 40.3 - 47.4 %   Platelets 318 150 - 400 K/uL   nRBC 0.0 0.0 - 0.2 %   Neutrophils Relative % 78 %  Neutro Abs 7.0 1.7 - 7.7 K/uL   Lymphocytes Relative 12 %   Lymphs Abs 1.1 0.7 - 4.0 K/uL   Monocytes Relative 7 %   Monocytes Absolute 0.7 0 - 1 K/uL   Eosinophils Relative 2 %   Eosinophils Absolute 0.2 0 - 0 K/uL   Basophils Relative 0 %   Basophils Absolute 0.0 0 - 0 K/uL   Immature Granulocytes 1 %   Abs Immature Granulocytes 0.06 0.00 - 0.07 K/uL    Comment: Performed at [facility], [address], [place], [place] [address]  Magnesium     Status: None   Collection Time: 06/25/19  4:10 AM  Result Value Ref Range   Magnesium 2.1 1.7 - 2.4 mg/dL    Comment: Performed at [facility], [address] [place], [place] [address]  Phosphorus     Status: None   Collection Time: 06/25/19  4:10 AM  Result Value Ref Range   Phosphorus 3.7 2.5 - 4.6 mg/dL    Comment: Performed at [facility], [address] [place], [place] [address]  Lactic  acid, plasma     Status: None   Collection Time: 07/10/19 11:25 AM  Result Value Ref Range   Lactic Acid, Venous 0.5 0.5 - 1.9 mmol/L    Comment: Performed at [facility], [address] [place], [place] [address]  Comprehensive metabolic panel     Status: Abnormal   Collection Time: 07/10/19 11:35 AM  Result Value Ref Range   Sodium 139 135 - 145 mmol/L   Potassium 4.4 3.5 - 5.1 mmol/L   Chloride 88 (L) 98 - 111 mmol/L   CO2 38 (H) 22 - 32 mmol/L   Glucose, Bld 124 (H) 70 - 99 mg/dL    Comment: Glucose reference range applies only to samples taken after fasting for at least 8 hours.   BUN 14 6 - 20 mg/dL   Creatinine, Ser 2.13 0.44 - 1.00 mg/dL   Calcium 9.2 8.9 - 08.6 mg/dL   Total Protein 7.3 6.5 - 8.1 g/dL   Albumin 3.6 3.5 - 5.0 g/dL   AST 22 15 - 41 U/L   ALT 21 0 - 44 U/L   Alkaline Phosphatase 93 38 - 126 U/L   Total Bilirubin 0.5 0.3 - 1.2 mg/dL   GFR calc non Af Amer >60 >60 mL/min   GFR calc Af Amer >60 >60 mL/min   Anion gap 13 5 - 15    Comment: Performed at [facility], [address] [place], [place] [address]  Lactic acid, plasma     Status: None   Collection Time: 07/10/19 11:35 AM  Result Value Ref Range   Lactic Acid, Venous 1.2 0.5 - 1.9 mmol/L    Comment: Performed at [facility], [address] [place], [place] [address]  CBC with Differential     Status: Abnormal   Collection Time: 07/10/19 11:35 AM  Result Value Ref Range   WBC 7.1 4.0 - 10.5 K/uL   RBC 3.59 (L) 3.87 - 5.11 MIL/uL   Hemoglobin 10.4 (L) 12.0 - 15.0 g/dL   HCT 28.4 (L) 36 - 46 %   MCV 94.7 80.0 - 100.0 fL   MCH 29.0 26.0 - 34.0 pg   MCHC 30.6 30.0 - 36.0 g/dL   RDW 13.2 44.0 - 10.2 %   Platelets 322 150 - 400 K/uL   nRBC 0.0 0.0 - 0.2 %   Neutrophils Relative %  71 %   Neutro Abs 5.0 1.7 - 7.7 K/uL   Lymphocytes Relative 13 %   Lymphs Abs 0.9 0.7 - 4.0 K/uL   Monocytes Relative 8 %   Monocytes Absolute 0.5 0 - 1 K/uL   Eosinophils Relative 7 %    Eosinophils Absolute 0.5 0 - 0 K/uL   Basophils Relative 1 %   Basophils Absolute 0.1 0 - 0 K/uL   Immature Granulocytes 0 %   Abs Immature Granulocytes 0.03 0.00 - 0.07 K/uL    Comment: Performed at [facility], [address] [place], [place] [address]  Protime-INR     Status: None   Collection Time: 07/10/19 11:35 AM  Result Value Ref Range   Prothrombin Time 11.8 11.4 - 15.2 seconds   INR 0.9 0.8 - 1.2    Comment: (NOTE) INR goal varies based on device and disease states. Performed at [facility], [address] [place], [place] [address]   Culture, blood (Routine x 2)     Status: None   Collection Time: 07/10/19 11:35 AM   Specimen: BLOOD  Result Value Ref Range   Specimen Description BLOOD BLOOD LEFT FOREARM    Special Requests      BOTTLES DRAWN AEROBIC AND ANAEROBIC Blood Culture results may not be optimal due to an excessive volume of blood received in culture bottles   Culture      NO GROWTH 5 DAYS Performed at [facility], [address], [place], [place] [address]    Report Status 07/15/2019 FINAL   Troponin I (High Sensitivity)     Status: None   Collection Time: 07/10/19 11:35 AM  Result Value Ref Range   Troponin I (High Sensitivity) 10 <18 ng/L    Comment: (NOTE) Elevated high sensitivity troponin I (hsTnI) values and significant  changes across serial measurements may suggest ACS but many other  chronic and acute conditions are known to elevate hsTnI results.  Refer to the "Links" section for chest pain algorithms and additional  guidance. Performed at [facility], [address] [place], [place] [address]   Brain natriuretic peptide     Status: None   Collection Time: 07/10/19 11:35 AM  Result Value Ref Range   B Natriuretic Peptide 20.2 0.0 - 100.0 pg/mL    Comment: Performed at [facility], [address] [place], [place] [address]  Culture, blood (Routine X 2) w Reflex to ID Panel      Status: None   Collection Time: 07/10/19  5:43 PM   Specimen: BLOOD  Result Value Ref Range   Specimen Description BLOOD BLOOD LEFT WRIST    Special Requests      BOTTLES DRAWN AEROBIC AND ANAEROBIC Blood Culture adequate volume   Culture      NO GROWTH 5 DAYS Performed at [facility], [address], [place], [place] [address]    Report Status 07/15/2019 FINAL   SARS Coronavirus 2 by RT PCR (hospital order, performed in [facility] hospital lab) Nasopharyngeal Nasopharyngeal Swab     Status: None   Collection Time: 07/10/19  5:45 PM   Specimen: Nasopharyngeal Swab  Result Value Ref Range   SARS Coronavirus 2 NEGATIVE NEGATIVE    Comment: (NOTE) SARS-CoV-2 target nucleic acids are NOT DETECTED.  The SARS-CoV-2 RNA is generally detectable in upper and lower respiratory specimens during the acute phase of infection. The lowest concentration of SARS-CoV-2 viral copies this assay can detect is 250 copies /  mL. A negative result does not preclude SARS-CoV-2 infection and should not be used as the sole basis for treatment or other patient management decisions.  A negative result may occur with improper specimen collection / handling, submission of specimen other than nasopharyngeal swab, presence of viral mutation(s) within the areas targeted by this assay, and inadequate number of viral copies (<250 copies / mL). A negative result must be combined with clinical observations, patient history, and epidemiological information.  Fact Sheet for Patients:   BoilerBrush.com.cy  Fact Sheet for Healthcare Providers: https://pope.com/  This test is not yet approved or  cleared by the [place] FDA and has been authorized for detection and/or diagnosis of SARS-CoV-2 by FDA under an Emergency Use Authorization (EUA).  This EUA will remain in effect (meaning this test can be used) for the duration of the COVID-19 declaration under  Section 564(b)(1) of the Act, 21 U.S.C. section 360bbb-3(b)(1), unless the authorization is terminated or revoked sooner.  Performed at [facility], [address] [place], [place] [address]   Troponin I (High Sensitivity)     Status: None   Collection Time: 07/10/19  7:32 PM  Result Value Ref Range   Troponin I (High Sensitivity) 8 <18 ng/L    Comment: (NOTE) Elevated high sensitivity troponin I (hsTnI) values and significant  changes across serial measurements may suggest ACS but many other  chronic and acute conditions are known to elevate hsTnI results.  Refer to the "Links" section for chest pain algorithms and additional  guidance. Performed at [facility], [address] [place], [place] [address]   Procalcitonin - Baseline     Status: None   Collection Time: 07/10/19  7:32 PM  Result Value Ref Range   Procalcitonin <0.10 ng/mL    Comment:        Interpretation: PCT (Procalcitonin) <= 0.5 ng/mL: Systemic infection (sepsis) is not likely. Local bacterial infection is possible. (NOTE)       Sepsis PCT Algorithm           Lower Respiratory Tract                                      Infection PCT Algorithm    ----------------------------     ----------------------------         PCT < 0.25 ng/mL                PCT < 0.10 ng/mL          Strongly encourage             Strongly discourage   discontinuation of antibiotics    initiation of antibiotics    ----------------------------     -----------------------------       PCT 0.25 - 0.50 ng/mL            PCT 0.10 - 0.25 ng/mL               OR       >80% decrease in PCT            Discourage initiation of                                            antibiotics      Encourage discontinuation  of antibiotics    ----------------------------     -----------------------------         PCT >= 0.50 ng/mL              PCT 0.26 - 0.50 ng/mL               AND        <80% decrease in PCT              Encourage initiation of                                             antibiotics       Encourage continuation           of antibiotics    ----------------------------     -----------------------------        PCT >= 0.50 ng/mL                  PCT > 0.50 ng/mL               AND         increase in PCT                  Strongly encourage                                      initiation of antibiotics    Strongly encourage escalation           of antibiotics                                     -----------------------------                                           PCT <= 0.25 ng/mL                                                 OR                                        > 80% decrease in PCT                                      Discontinue / Do not initiate                                             antibiotics  Performed at [facility], [address] [place], [place] [id number]   Basic metabolic panel     Status: Abnormal   Collection Time: 07/11/19  6:11 AM  Result Value Ref Range   Sodium 139 135 - 145 mmol/L  Potassium 3.9 3.5 - 5.1 mmol/L   Chloride 91 (L) 98 - 111 mmol/L   CO2 36 (H) 22 - 32 mmol/L   Glucose, Bld 158 (H) 70 - 99 mg/dL    Comment: Glucose reference range applies only to samples taken after fasting for at least 8 hours.   BUN 15 6 - 20 mg/dL   Creatinine, Ser 9.20 0.44 - 1.00 mg/dL   Calcium 9.1 8.9 - 10.0 mg/dL   GFR calc non Af Amer >60 >60 mL/min   GFR calc Af Amer >60 >60 mL/min   Anion gap 12 5 - 15    Comment: Performed at [facility], [address] [place], [place] [address]  Fibrin derivatives D-Dimer [facility] only)     Status: Abnormal   Collection Time: 07/11/19  4:18 PM  Result Value Ref Range   Fibrin derivatives D-dimer ([facility]) 1,932.04 (H) 0.00 - 499.00 ng/mL (FEU)    Comment: (NOTE) <> Exclusion of Venous Thromboembolism (VTE) - OUTPATIENT ONLY   (Emergency Department or [place])    0-499 ng/ml (FEU): With a low to  intermediate pretest probability                      for VTE this test result excludes the diagnosis                      of VTE.   >499 ng/ml (FEU) : VTE not excluded; additional work up for VTE is                      required.  <> Testing on Inpatients and Evaluation of Disseminated Intravascular   Coagulation (DIC) Reference Range:   0-499 ng/ml (FEU) Performed at [facility], [address] [place], [place] [address]   CBC with Differential     Status: Abnormal   Collection Time: 08/01/19  4:49 PM  Result Value Ref Range   WBC 9.3 4.0 - 10.5 K/uL   RBC 2.21 (L) 3.87 - 5.11 MIL/uL   Hemoglobin 6.3 (L) 12.0 - 15.0 g/dL   HCT 25.4 (L) 36 - 46 %   MCV 94.6 80.0 - 100.0 fL   MCH 28.5 26.0 - 34.0 pg   MCHC 30.1 30.0 - 36.0 g/dL   RDW 98.2 64.1 - 58.3 %   Platelets 175 150 - 400 K/uL   nRBC 0.0 0.0 - 0.2 %   Neutrophils Relative % 93 %   Neutro Abs 8.6 (H) 1.7 - 7.7 K/uL   Lymphocytes Relative 4 %   Lymphs Abs 0.3 (L) 0.7 - 4.0 K/uL   Monocytes Relative 3 %   Monocytes Absolute 0.3 0 - 1 K/uL   Eosinophils Relative 0 %   Eosinophils Absolute 0.0 0 - 0 K/uL   Basophils Relative 0 %   Basophils Absolute 0.0 0 - 0 K/uL   Immature Granulocytes 0 %   Abs Immature Granulocytes 0.04 0.00 - 0.07 K/uL    Comment: Performed at [facility], [address] [place], [place] [address]  Basic metabolic panel     Status: Abnormal   Collection Time: 08/01/19  4:49 PM  Result Value Ref Range   Sodium 141 135 - 145 mmol/L   Potassium 4.8 3.5 - 5.1 mmol/L   Chloride 92 (L) 98 - 111 mmol/L   CO2 39 (H) 22 - 32 mmol/L   Glucose, Bld 135 (H) 70 - 99 mg/dL  Comment: Glucose reference range applies only to samples taken after fasting for at least 8 hours.   BUN 18 6 - 20 mg/dL   Creatinine, Ser 0.98 0.44 - 1.00 mg/dL   Calcium 8.8 (L) 8.9 - 10.3 mg/dL   GFR calc non Af Amer >60 >60 mL/min   GFR calc Af Amer >60 >60 mL/min   Anion gap 10 5 - 15    Comment: Performed  at [facility], [address] [place], [place] [address]  Brain natriuretic peptide     Status: Abnormal   Collection Time: 08/01/19  4:49 PM  Result Value Ref Range   B Natriuretic Peptide 135.6 (H) 0.0 - 100.0 pg/mL    Comment: Performed at [facility], [address] [place], [place] [address]  Troponin I (High Sensitivity)     Status: Abnormal   Collection Time: 08/01/19  4:49 PM  Result Value Ref Range   Troponin I (High Sensitivity) 18 (H) <18 ng/L    Comment: (NOTE) Elevated high sensitivity troponin I (hsTnI) values and significant  changes across serial measurements may suggest ACS but many other  chronic and acute conditions are known to elevate hsTnI results.  Refer to the "Links" section for chest pain algorithms and additional  guidance. Performed at [facility], [address] [place], [place] [address]   Lactic acid, plasma     Status: None   Collection Time: 08/01/19  4:49 PM  Result Value Ref Range   Lactic Acid, Venous 0.6 0.5 - 1.9 mmol/L    Comment: Performed at [facility], [address] [place], [place] [address]  CBC     Status: Abnormal   Collection Time: 08/01/19  5:49 PM  Result Value Ref Range   WBC 6.8 4.0 - 10.5 K/uL   RBC 1.79 (L) 3.87 - 5.11 MIL/uL   Hemoglobin 5.1 (L) 12.0 - 15.0 g/dL   HCT 46.9 (L) 36 - 46 %   MCV 96.1 80.0 - 100.0 fL   MCH 28.5 26.0 - 34.0 pg   MCHC 29.7 (L) 30.0 - 36.0 g/dL   RDW 62.9 52.8 - 41.3 %   Platelets 142 (L) 150 - 400 K/uL   nRBC 0.0 0.0 - 0.2 %    Comment: Performed at [facility], [address] [place], [place] [address]  Blood culture (routine x 2)     Status: None   Collection Time: 08/01/19  5:49 PM   Specimen: BLOOD  Result Value Ref Range   Specimen Description BLOOD RIGHT ANTECUBITAL    Special Requests      BOTTLES DRAWN AEROBIC AND ANAEROBIC Blood Culture adequate volume   Culture      NO GROWTH 5 DAYS Performed at [facility],  [address], [place], [place] [address]    Report Status 08/06/2019 FINAL   ABO/Rh     Status: None   Collection Time: 08/01/19  5:49 PM  Result Value Ref Range   ABO/RH(D)      A POS Performed at [facility], [address] [place], [place] [address]   Blood culture (routine x 2)     Status: None   Collection Time: 08/01/19  5:54 PM   Specimen: BLOOD  Result Value Ref Range   Specimen Description BLOOD LEFT ANTECUBITAL    Special Requests      BOTTLES DRAWN AEROBIC AND ANAEROBIC Blood Culture adequate volume   Culture      NO  GROWTH 5 DAYS Performed at [facility], [address] [place], [place] [address]    Report Status 08/06/2019 FINAL   Type and screen     Status: None   Collection Time: 08/01/19  6:25 PM  Result Value Ref Range   ABO/RH(D) A POS    Antibody Screen NEG    Sample Expiration 08/04/2019,2359    Unit Number [id number]    Blood Component Type RED CELLS,LR    Unit division 00    Status of Unit ISSUED,FINAL    Transfusion Status OK TO TRANSFUSE    Crossmatch Result      Compatible Performed at [facility], [address] [place], [place] [address]    Unit Number [id number]    Blood Component Type RED CELLS,LR    Unit division 00    Status of Unit ISSUED,FINAL    Transfusion Status OK TO TRANSFUSE    Crossmatch Result Compatible   BPAM RBC     Status: None   Collection Time: 08/01/19  6:25 PM  Result Value Ref Range   ISSUE DATE / TIME [id number]    Blood Product Unit Number [id number]    PRODUCT CODE [id number]    Unit Type and Rh 6200    Blood Product Expiration Date 314970263785    ISSUE DATE / TIME 885027741287    Blood Product Unit Number [id number]    PRODUCT CODE [id number]    Unit Type and Rh 6200    Blood Product Expiration Date 765465035465   SARS Coronavirus 2 by RT PCR (hospital order, performed in [facility] hospital lab) Nasopharyngeal Nasopharyngeal Swab     Status: None    Collection Time: 08/01/19  6:30 PM   Specimen: Nasopharyngeal Swab  Result Value Ref Range   SARS Coronavirus 2 NEGATIVE NEGATIVE    Comment: (NOTE) SARS-CoV-2 target nucleic acids are NOT DETECTED.  The SARS-CoV-2 RNA is generally detectable in upper and lower respiratory specimens during the acute phase of infection. The lowest concentration of SARS-CoV-2 viral copies this assay can detect is 250 copies / mL. A negative result does not preclude SARS-CoV-2 infection and should not be used as the sole basis for treatment or other patient management decisions.  A negative result may occur with improper specimen collection / handling, submission of specimen other than nasopharyngeal swab, presence of viral mutation(s) within the areas targeted by this assay, and inadequate number of viral copies (<250 copies / mL). A negative result must be combined with clinical observations, patient history, and epidemiological information.  Fact Sheet for Patients:   BoilerBrush.com.cy  Fact Sheet for Healthcare Providers: https://pope.com/  This test is not yet approved or  cleared by the [place] FDA and has been authorized for detection and/or diagnosis of SARS-CoV-2 by FDA under an Emergency Use Authorization (EUA).  This EUA will remain in effect (meaning this test can be used) for the duration of the COVID-19 declaration under Section 564(b)(1) of the Act, 21 U.S.C. section 360bbb-3(b)(1), unless the authorization is terminated or revoked sooner.  Performed at [facility], [address] [place], [place] [address]   Prepare RBC (crossmatch)     Status: None   Collection Time: 08/01/19  8:00 PM  Result Value Ref Range   Order Confirmation      ORDER PROCESSED BY BLOOD BANK Performed at [facility], [address] [place], [place] [address]   Troponin I (High Sensitivity)     Status: None   Collection  Time:  08/01/19 10:26 PM  Result Value Ref Range   Troponin I (High Sensitivity) 17 <18 ng/L    Comment: (NOTE) Elevated high sensitivity troponin I (hsTnI) values and significant  changes across serial measurements may suggest ACS but many other  chronic and acute conditions are known to elevate hsTnI results.  Refer to the "Links" section for chest pain algorithms and additional  guidance. Performed at [facility], [address] [place], [place] [address]   HIV Antibody (routine testing w rflx)     Status: None   Collection Time: 08/01/19 10:26 PM  Result Value Ref Range   HIV Screen 4th Generation wRfx Non Reactive Non Reactive    Comment: Performed at [facility], [address], [place], [place] [address]  Hemoglobin and hematocrit, blood     Status: None   Collection Time: 08/02/19  9:37 AM  Result Value Ref Range   Hemoglobin 12.8 12.0 - 15.0 g/dL    Comment: REPEATED TO VERIFY   HCT 41.3 36 - 46 %    Comment: Performed at [facility], [address] [place], [place] [address]  Blood gas, arterial     Status: Abnormal   Collection Time: 08/02/19 10:02 AM  Result Value Ref Range   FIO2 0.36    Delivery systems NASAL CANNULA    pH, Arterial 7.51 (H) 7.35 - 7.45   pCO2 arterial 54 (H) 32 - 48 mmHg   pO2, Arterial 53 (L) 83 - 108 mmHg   Bicarbonate 43.1 (H) 20.0 - 28.0 mmol/L   Acid-Base Excess 17.3 (H) 0.0 - 2.0 mmol/L   O2 Saturation 90.3 %   Patient temperature 37.0    Collection site RIGHT BRACHIAL    Sample type ARTERIAL DRAW    Allens test (pass/fail) PASS PASS    Comment: Performed at [facility], [address] [place], [place] [address]  Basic metabolic panel     Status: Abnormal   Collection Time: 08/03/19  4:32 AM  Result Value Ref Range   Sodium 140 135 - 145 mmol/L   Potassium 3.6 3.5 - 5.1 mmol/L   Chloride 95 (L) 98 - 111 mmol/L   CO2 33 (H) 22 - 32 mmol/L   Glucose, Bld 146 (H) 70 - 99 mg/dL    Comment: Glucose  reference range applies only to samples taken after fasting for at least 8 hours.   BUN 19 6 - 20 mg/dL   Creatinine, Ser 3.87 0.44 - 1.00 mg/dL   Calcium 9.1 8.9 - 56.4 mg/dL   GFR calc non Af Amer >60 >60 mL/min   GFR calc Af Amer >60 >60 mL/min   Anion gap 12 5 - 15    Comment: Performed at [facility], [address] [place], [place] [address]  CBC     Status: Abnormal   Collection Time: 08/03/19  6:18 AM  Result Value Ref Range   WBC 11.9 (H) 4.0 - 10.5 K/uL   RBC 4.54 3.87 - 5.11 MIL/uL   Hemoglobin 12.4 12.0 - 15.0 g/dL   HCT 18.8 36 - 46 %   MCV 85.5 80.0 - 100.0 fL    Comment: POST TRANSFUSION SPECIMEN   MCH 27.3 26.0 - 34.0 pg   MCHC 32.0 30.0 - 36.0 g/dL   RDW 41.6 60.6 - 30.1 %   Platelets 301 150 - 400 K/uL    Comment: POST TRANSFUSION SPECIMEN   nRBC 0.0 0.0 - 0.2 %  Comment: Performed at [facility], [address] [place], [place] [address]  CBC     Status: Abnormal   Collection Time: 08/04/19  3:57 AM  Result Value Ref Range   WBC 13.8 (H) 4.0 - 10.5 K/uL   RBC 4.93 3.87 - 5.11 MIL/uL   Hemoglobin 13.5 12.0 - 15.0 g/dL   HCT 60.4 36 - 46 %   MCV 87.8 80.0 - 100.0 fL   MCH 27.4 26.0 - 34.0 pg   MCHC 31.2 30.0 - 36.0 g/dL   RDW 54.0 98.1 - 19.1 %   Platelets 334 150 - 400 K/uL   nRBC 0.0 0.0 - 0.2 %    Comment: Performed at [facility], [address], [place], [place] [address]  Basic metabolic panel     Status: Abnormal   Collection Time: 08/04/19  3:57 AM  Result Value Ref Range   Sodium 139 135 - 145 mmol/L   Potassium 4.6 3.5 - 5.1 mmol/L   Chloride 96 (L) 98 - 111 mmol/L   CO2 32 22 - 32 mmol/L   Glucose, Bld 142 (H) 70 - 99 mg/dL    Comment: Glucose reference range applies only to samples taken after fasting for at least 8 hours.   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  Discussed would need to discuss with her pulmonology physician and palliative care team their thoughts on this treatment plan and we can circle back to it.  Discussed need for signed pain management contract due to her prescriptions while on palliative care.  Patient interested in switching to MS Contin from liquid morphine at next refill.  Discussed needs to come in before next refill and we will fill out pain management contract.  Patient in agreement  Plan: 1. Patient to call pulmonary for follow up visit 2. FNP to contact pulmonary team/palliative care team for input on patient request for long term steroids      Pulmonary emphysema (HCC) - Primary    See chronic bronchitis with COPD A/P      Chronic respiratory failure (HCC)    See chronic bronchitis with COPD A/P         No orders of the defined types were placed in this encounter.   Follow-up: - Return in 3-4 weeks for next COPD follow up visit and to sign pain management contract  Patient verbalizes understanding with the above medical recommendations including the limitation of remote medical advice.  Specific follow-up and call-back criteria were given for patient to follow-up or seek medical care more urgently if needed.  - Time spent in direct consultation with patient on phone: 6 minutes  Charlaine Dalton, FNP-C [facility] 08/27/2019, 9:57 AM

## 2019-08-27 NOTE — Assessment & Plan Note (Signed)
See chronic bronchitis with COPD A/P 

## 2019-08-29 ENCOUNTER — Telehealth: Payer: Self-pay

## 2019-08-30 ENCOUNTER — Ambulatory Visit: Payer: Medicaid Other | Admitting: Pharmacist

## 2019-08-30 DIAGNOSIS — I10 Essential (primary) hypertension: Secondary | ICD-10-CM

## 2019-08-30 DIAGNOSIS — J439 Emphysema, unspecified: Secondary | ICD-10-CM

## 2019-08-30 NOTE — Patient Instructions (Signed)
Thank you allowing the Care Management Team to be a part of your care! It was a pleasure speaking with you today!     Care Management Team    Alto Denver RN, MSN, CCM Nurse Care Coordinator  [phone]   Duanne Moron PharmD  Clinical Pharmacist  [phone]   Dickie La LCSW Clinical Social Worker [phone]   Visit Information  Goals Addressed            This Visit's Progress    PharmD - Medication Management       CARE PLAN ENTRY (see longitudinal plan of care for additional care plan information)   Current Barriers:   Chronic Disease Management support, education, and care coordination needs related to COPD on oxygen, HTN, depression/anxiety, HLD and GERD  Pharmacist Clinical Goal(s):   Over the next 30 days, patient will work with CM Pharmacist and PCP to address needs related to medication adherence  Interventions:  Perform chart review  Comprehensive medication review performed; medication list updated in electronic medical record o Identify patient currently taking amlodipine 5 mg daily. - From review of chart, note patient's amlodipine dose was to be increased to amlodipine 10 mg daily at hospital discharge on 6/1  Note amlodipine 10 mg Rx sent to pharmacy on 6/1 for 30 day supply, no refills - Caregiver reports patient has been taking 5 mg daily dose since she has been managing patient's medications >2 weeks ago.   Reports has been monitoring patient's home BP, but is unable to locate log  Checks patient's BP while on phone today: 129/61, HR 80 o Provides additional readings from monitor (but not sure who took readings for patient/technique used): - 8/5: 116/96, HR 90 - 8/4: 165/87, HR 84 o Counsel on BP monitoring technique  Encourage caregiver to continue to monitor patient's BP, keep log and bring this log with patient to upcoming appointment with PCP  Will collaborate with PCP o Caution for risk of sedation/dizziness with  morphine, cyclobenzaprine, hydroxyzine and cetirizine, particularly when taken in combination o Identify patient in need of refill of cetirizine. [name] states will call pharmacy for refill  Counsel on importance of medication adherence.  o [name] reports she continues to fill weekly pillbox and administers patient's medications o Reports keeps log to record all times of morphine administration to avoid errors in dosing. - Reports typically taking as needed morphine dose ~ every 6 hours - Will collaborate with PCP  Patient Self Care Activities:   Calls provider office for new concerns or questions  Unable to self administer medications as prescribed. Sister currently managing medications for patient  Please see past updates related to this goal by clicking on the "Past Updates" button in the selected goal         Patient verbalizes understanding of instructions provided today.   Telephone follow up appointment with care management team member scheduled for: 9/8 at 1 pm  Duanne Moron, PharmD, [facility] Clinical Pharmacist [facility] [employer] [phone]

## 2019-08-30 NOTE — Chronic Care Management (AMB) (Deleted)
Chronic Care Management   Note  08/30/2019 Name: Alicia Gibson MRN: [id number] DOB: 1960/09/19   Subjective:   Alicia Gibson is a 59 y.o. year old female who is a primary care patient of Alicia Gibson, Alicia Gibson. The CM team was consulted for assistance with chronic disease management and care coordination.   I reached out to Alicia Gibson by phone today. [name] asks/gives permission for me to speak with her sister Alicia Gibson who she reports is currently caring for her and managing her medications.  Review of patient status, including review of consultants reports, laboratory and other test data, was performed as part of comprehensive evaluation and provision of chronic care management services.   Objective:  Lab Results  Component Value Date   CREATININE 0.77 08/04/2019   CREATININE 0.59 08/03/2019   CREATININE 0.58 08/01/2019    No results found for: HGBA1C     Component Value Date/Time   CHOL 235 (H) 11/09/2018 0839   CHOL 328 (H) 02/13/2018 0937   TRIG 112 11/09/2018 0839   HDL 82 11/09/2018 0839   HDL 74 02/13/2018 0937   CHOLHDL 2.9 11/09/2018 0839   LDLCALC 131 (H) 11/09/2018 0839       BP Readings from Last 3 Encounters:  08/13/19 121/70  08/04/19 (!) 143/87  07/13/19 (!) 146/90    Allergies  Allergen Reactions  . Contrast Allergy Premed Pack [Prednisone & Diphenhydramine] Anaphylaxis    Patient states she had iv contrast in 2008 which caused her throat to close and difficulty breathing. She tolerates methylprednisolone  . Iodine Anaphylaxis  . Codeine Hives and Nausea And Vomiting  . Shellfish Allergy Hives  . Penicillins Hives and Rash    Medications Reviewed Today    Reviewed by Alicia Gibson, [facility] Gibson (Pharmacist) on 08/30/19 at [phone]  Med List Status: <None>  Medication Order Taking? Sig Documenting Provider Last Dose Status Informant  albuterol (PROVENTIL) (2.5 MG/3ML) 0.083% nebulizer solution [id number] Yes Take 3 mLs (2.5 mg total) by  nebulization 4 (four) times daily. Alicia Gibson Taking Active Friend  albuterol (VENTOLIN HFA) 108 (90 Base) MCG/ACT inhaler [id number] Yes INHALE 2 PUFFS INTO THE LUNGS EVERY 6 HOURS AS NEEDED FOR WHEEZING OR SHORTNESS OF BREATH Alicia Gibson, Alicia Gibson Taking Active   amLODipine (NORVASC) 10 MG tablet [id number] Yes Take 1 tablet (10 mg total) by mouth daily.  Patient taking differently: Take 5 mg by mouth daily.    Alicia Dallas, Alicia Gibson Taking Active Friend  aspirin EC 81 MG tablet [id number] Yes Take 81 mg by mouth daily. Provider, Historical, Alicia Gibson Taking Active Friend  atorvastatin (LIPITOR) 40 MG tablet [id number] Yes TAKE 1 TABLET(40 MG) BY MOUTH DAILY Alicia Gibson, Alicia Gibson Taking Active Friend  budesonide (PULMICORT) 0.5 MG/2ML nebulizer solution [id number] Yes Take 2 mLs (0.5 mg total) by nebulization 2 (two) times daily. Alicia Gibson, Alicia Gibson Taking Active Friend  cetirizine (ZYRTEC) 10 MG tablet [id number] Yes Take 1 tablet (10 mg total) by mouth daily. Alicia Gibson, Alicia Gibson Taking Active Friend  cyclobenzaprine (FLEXERIL) 10 MG tablet [id number] Yes TAKE 1 TABLET BY MOUTH 2 TIMES DAILY AS NEEDED FOR MUSCLE SPASMS  Patient taking differently: Take 10 mg by mouth 2 (two) times daily as needed for muscle spasms.    Alicia Gibson, Alicia Gibson Taking Active Friend  formoterol (PERFOROMIST) 20 MCG/2ML nebulizer solution [id number] Yes Take 2 mLs (20 mcg total) by nebulization 2 (two) times daily. Alicia Fulling, Alicia Gibson Taking Active Friend  guaiFENesin [facility])  600 MG 12 hr tablet [id number] Yes Take 2 tablets (1,200 mg total) by mouth 2 (two) times daily as needed for cough or to loosen phlegm. Alicia Gibson, Alicia Gibson Taking Active Friend  hydrOXYzine (ATARAX/VISTARIL) 25 MG tablet [id number] Yes Take 1 tablet (25 mg total) by mouth 3 (three) times daily as needed for anxiety.  Patient taking differently: Take 20 mg by mouth 3 (three) times daily as needed for anxiety.    Alicia Dallas, Alicia Gibson Taking Active Friend    Morphine Sulfate (MORPHINE CONCENTRATE) 10 MG/0.5ML SOLN concentrated solution [id number] Yes Take 0.25 mLs (5 mg total) by mouth every 4 (four) hours as needed for severe pain or shortness of breath. Alicia Fuller, Gibson Taking Active   Multiple Vitamin (MULTIVITAMIN WITH MINERALS) TABS tablet [id number] Yes Take 1 tablet by mouth daily. Provider, Historical, Alicia Gibson Taking Active Friend  nicotine (NICODERM CQ - DOSED IN MG/24 HOURS) 21 mg/24hr patch [id number] Yes Place 1 patch (21 mg total) onto the skin daily. Alicia Fuller, Gibson Taking Active   omeprazole (PRILOSEC) 20 MG capsule [id number] Yes Take by mouth daily. Provider, Historical, Alicia Gibson Taking Active        Patient not taking:      Discontinued 08/30/19 0942 (Completed Course)   sertraline (ZOLOFT) 100 MG tablet [id number] Yes Take 1 tablet (100 mg total) by mouth daily. Alicia Gibson, Alicia Gibson Taking Active Friend  Med List Note Alicia Gibson, CPhT 12/06/17 1515): No preferred pharmacy reported            Assessment:   Goals Addressed            This Visit's Progress   . PharmD - Medication Management       CARE PLAN ENTRY (see longitudinal plan of care for additional care plan information)   Current Barriers:  . Chronic Disease Management support, education, and care coordination needs related to COPD on oxygen, HTN, depression/anxiety, HLD and GERD  Pharmacist Clinical Goal(s):  Marland Kitchen Over the next 30 days, patient will work with CM Pharmacist and PCP to address needs related to medication adherence  Interventions: . Perform chart review . Comprehensive medication review performed; medication list updated in electronic medical record o Identify patient currently taking amlodipine 5 mg daily. - From review of chart, note patient's amlodipine dose was to be increased to amlodipine 10 mg daily at hospital discharge on 6/1 . Note amlodipine 10 mg Rx sent to pharmacy on 6/1 for 30 day supply, no refills - Caregiver reports patient  has been taking 5 mg daily dose since she has been managing patient's medications >2 weeks ago.  . Reports has been monitoring patient's home BP, but is unable to locate log . Checks patient's BP while on phone today: 129/61, HR 80 o Provides additional readings from monitor (but not sure who took readings for patient/technique used): - 8/5: 116/96, HR 90 - 8/4: 165/87, HR 84 o Counsel on BP monitoring technique . Encourage caregiver to continue to monitor patient's BP, keep log and bring this log with patient to upcoming appointment with PCP . Will collaborate with PCP o Caution for risk of sedation/dizziness with morphine, cyclobenzaprine, hydroxyzine and cetirizine, particularly when taken in combination o Identify patient in need of refill of cetirizine. Alicia Gibson states will call pharmacy for refill . Counsel on importance of medication adherence.  o Alicia Gibson reports she continues to fill weekly pillbox and administers patient's medications o Reports keeps log to record all times of morphine administration  to avoid errors in dosing. - Reports typically taking as needed morphine dose ~ every 6 hours - Will collaborate with PCP  Patient Self Care Activities:  . Calls provider office for new concerns or questions . Unable to self administer medications as prescribed. Sister currently managing medications for patient  Please see past updates related to this goal by clicking on the "Past Updates" button in the selected goal         Plan:  Telephone follow up appointment with care management team member scheduled for: 9/8 at 1 pm  Duanne Moron, PharmD, [facility] Clinical Pharmacist [facility] [phone]

## 2019-08-30 NOTE — Chronic Care Management (AMB) (Signed)
Care Management   Initial Visit Note  08/30/2019 Name: Alicia Gibson MRN: [id number] DOB: 1960-03-12  Subjective:   Objective:  Assessment: [name] is a 59 y.o. year old female who sees Malfi, Jodelle Gross, FNP for primary care. The care management team was consulted for assistance with care management and care coordination needs related to Medication Management and Education.   Outreach to [employer] by phone today. [name] asks/gives permission for me to speak with her sister [name] who she reports is currently caring for her and managing her medications.  Review of patient status, including review of consultants reports, relevant laboratory and other test results, and collaboration with appropriate care team members and the patient's provider was performed as part of comprehensive patient evaluation and provision of care management services.    SDOH (Social Determinants of Health) assessments performed: No See Care Plan activities for detailed interventions related to SDOH)    BP Readings from Last 3 Encounters:  08/13/19 121/70  08/04/19 (!) 143/87  07/13/19 (!) 146/90   Lab Results  Component Value Date   CREATININE 0.77 08/04/2019   BUN 26 (H) 08/04/2019   NA 139 08/04/2019   K 4.6 08/04/2019   CL 96 (L) 08/04/2019   CO2 32 08/04/2019     Outpatient Encounter Medications as of 08/30/2019  Medication Sig  . albuterol (PROVENTIL) (2.5 MG/3ML) 0.083% nebulizer solution Take 3 mLs (2.5 mg total) by nebulization 4 (four) times daily.  Marland Kitchen albuterol (VENTOLIN HFA) 108 (90 Base) MCG/ACT inhaler INHALE 2 PUFFS INTO THE LUNGS EVERY 6 HOURS AS NEEDED FOR WHEEZING OR SHORTNESS OF BREATH  . amLODipine (NORVASC) 10 MG tablet Take 1 tablet (10 mg total) by mouth daily. (Patient taking differently: Take 5 mg by mouth daily. )  . aspirin EC 81 MG tablet Take 81 mg by mouth daily.  Marland Kitchen atorvastatin (LIPITOR) 40 MG tablet TAKE 1 TABLET(40 MG) BY MOUTH DAILY  . budesonide (PULMICORT)  0.5 MG/2ML nebulizer solution Take 2 mLs (0.5 mg total) by nebulization 2 (two) times daily.  . cetirizine (ZYRTEC) 10 MG tablet Take 1 tablet (10 mg total) by mouth daily.  . Cholecalciferol 50 MCG (2000 UT) TABS Take 1 tablet by mouth daily.  . cyclobenzaprine (FLEXERIL) 10 MG tablet TAKE 1 TABLET BY MOUTH 2 TIMES DAILY AS NEEDED FOR MUSCLE SPASMS (Patient taking differently: Take 10 mg by mouth 2 (two) times daily as needed for muscle spasms. )  . formoterol (PERFOROMIST) 20 MCG/2ML nebulizer solution Take 2 mLs (20 mcg total) by nebulization 2 (two) times daily.  Marland Kitchen guaiFENesin (MUCINEX) 600 MG 12 hr tablet Take 2 tablets (1,200 mg total) by mouth 2 (two) times daily as needed for cough or to loosen phlegm.  . hydrOXYzine (ATARAX/VISTARIL) 25 MG tablet Take 1 tablet (25 mg total) by mouth 3 (three) times daily as needed for anxiety. (Patient taking differently: Take 20 mg by mouth 3 (three) times daily as needed for anxiety. )  . Morphine Sulfate (MORPHINE CONCENTRATE) 10 MG/0.5ML SOLN concentrated solution Take 0.25 mLs (5 mg total) by mouth every 4 (four) hours as needed for severe pain or shortness of breath.  . Multiple Vitamin (MULTIVITAMIN WITH MINERALS) TABS tablet Take 1 tablet by mouth daily.  . nicotine (NICODERM CQ - DOSED IN MG/24 HOURS) 21 mg/24hr patch Place 1 patch (21 mg total) onto the skin daily.  Marland Kitchen omeprazole (PRILOSEC) 20 MG capsule Take by mouth daily.  . sertraline (ZOLOFT) 100 MG tablet Take 1 tablet (  100 mg total) by mouth daily.  . [DISCONTINUED] predniSONE (DELTASONE) 10 MG tablet Take 4 tabs once daily x 3 days, then 3 tabs once daily for 3 days, then 2 tabs once daily for 3 days, then 1 tab once daily for 3 days, then OFF. (Patient not taking: Reported on 08/27/2019)   No facility-administered encounter medications on file as of 08/30/2019.    Goals Addressed            This Visit's Progress   . PharmD - Medication Management       CARE PLAN ENTRY (see longitudinal  plan of care for additional care plan information)   Current Barriers:  . Chronic Disease Management support, education, and care coordination needs related to COPD on oxygen, HTN, depression/anxiety, HLD and GERD  Pharmacist Clinical Goal(s):  Marland Kitchen Over the next 30 days, patient will work with CM Pharmacist and PCP to address needs related to medication adherence  Interventions: . Perform chart review . Comprehensive medication review performed; medication list updated in electronic medical record o Identify patient currently taking amlodipine 5 mg daily. - From review of chart, note patient's amlodipine dose was to be increased to amlodipine 10 mg daily at hospital discharge on 6/1 . Note amlodipine 10 mg Rx sent to pharmacy on 6/1 for 30 day supply, no refills - Caregiver reports patient has been taking 5 mg daily dose since she has been managing patient's medications >2 weeks ago.  . Reports has been monitoring patient's home BP, but is unable to locate log . Checks patient's BP while on phone today: 129/61, HR 80 o Provides additional readings from monitor (but not sure who took readings for patient/technique used): - 8/5: 116/96, HR 90 - 8/4: 165/87, HR 84 o Counsel on BP monitoring technique . Encourage caregiver to continue to monitor patient's BP, keep log and bring this log with patient to upcoming appointment with PCP . Will collaborate with PCP o Caution for risk of sedation/dizziness with morphine, cyclobenzaprine, hydroxyzine and cetirizine, particularly when taken in combination o Identify patient in need of refill of cetirizine. [name] states will call pharmacy for refill . Counsel on importance of medication adherence.  o [name] reports she continues to fill weekly pillbox and administers patient's medications o Reports keeps log to record all times of morphine administration to avoid errors in dosing. - Reports typically taking as needed morphine dose ~ every 6  hours - Will collaborate with PCP  Patient Self Care Activities:  . Calls provider office for new concerns or questions . Unable to self administer medications as prescribed. Sister currently managing medications for patient  Please see past updates related to this goal by clicking on the "Past Updates" button in the selected goal          Follow up plan:  Telephone follow up appointment with care management team member scheduled for: 9/8 at 1 pm  Duanne Moron, PharmD, [facility] Clinical Pharmacist [facility] [phone]

## 2019-09-02 ENCOUNTER — Other Ambulatory Visit: Payer: Self-pay | Admitting: Family Medicine

## 2019-09-02 DIAGNOSIS — F419 Anxiety disorder, unspecified: Secondary | ICD-10-CM

## 2019-09-02 NOTE — Telephone Encounter (Signed)
Requested medication (s) are due for refill today: yes  Requested medication (s) are on the active medication list: yes  Last refill:  05/27/19  Future visit scheduled: yes  Notes to clinic:  pt was instructed to stop taking med at discharge Noted med in pt's history   Requested Prescriptions  Pending Prescriptions Disp Refills   hydrOXYzine (ATARAX/VISTARIL) 10 MG tablet [Pharmacy Med Name: HYDROXYZINE HCL 10MG  TABLETS] 180 tablet 1    Sig: TAKE 1 TO 2 TABLETS(10 TO 20 MG) BY MOUTH THREE TIMES DAILY AS NEEDED FOR ANXIETY OR INSOMNIA      Ear, Nose, and Throat:  Antihistamines Passed - 09/02/2019 12:40 PM      Passed - Valid encounter within last 12 months    Recent Outpatient Visits           6 days ago Pulmonary emphysema, unspecified emphysema type (HCC)   [facility], [facility], FNP   2 weeks ago Palliative care by specialist   [facility], [facility], FNP   1 month ago Chronic bronchitis with COPD (chronic obstructive pulmonary disease) [facility])   [facility], [facility], FNP   3 months ago Gastroesophageal reflux disease, unspecified whether esophagitis present   [facility], [facility], FNP   9 months ago Chronic bronchitis with COPD (chronic obstructive pulmonary disease) (HCC)   [facility], Kyung Rudd, NP       Future Appointments             In 1 month Malfi, Alison Stalling, FNP [facility], [facility]

## 2019-09-03 ENCOUNTER — Telehealth (INDEPENDENT_AMBULATORY_CARE_PROVIDER_SITE_OTHER): Payer: Medicaid Other | Admitting: Family Medicine

## 2019-09-03 ENCOUNTER — Encounter: Payer: Self-pay | Admitting: Family Medicine

## 2019-09-03 ENCOUNTER — Telehealth: Payer: Self-pay

## 2019-09-03 ENCOUNTER — Other Ambulatory Visit: Payer: Self-pay

## 2019-09-03 DIAGNOSIS — J449 Chronic obstructive pulmonary disease, unspecified: Secondary | ICD-10-CM | POA: Diagnosis not present

## 2019-09-03 DIAGNOSIS — J301 Allergic rhinitis due to pollen: Secondary | ICD-10-CM | POA: Diagnosis not present

## 2019-09-03 MED ORDER — PREDNISONE 10 MG PO TABS: 10.0000 mg | ORAL_TABLET | Freq: Every day | ORAL | 1 refills | Status: AC

## 2019-09-03 MED ORDER — CETIRIZINE HCL 10 MG PO TABS: 10.0000 mg | ORAL_TABLET | Freq: Every day | ORAL | 5 refills | Status: DC

## 2019-09-03 NOTE — Progress Notes (Signed)
Virtual Visit via Telephone  The purpose of this virtual visit is to provide medical care while limiting exposure to the novel coronavirus (COVID19) for both patient and office staff.  Consent was obtained for phone visit:  Yes.   Answered questions that patient had about telehealth interaction:  Yes.   I discussed the limitations, risks, security and privacy concerns of performing an evaluation and management service by telephone. I also discussed with the patient that there may be a patient responsible charge related to this service. The patient expressed understanding and agreed to proceed.  Patient is at home and is accessed via telephone Services are provided by Charlaine Dalton, FNP-C from [facility])  ---------------------------------------------------------------------- Chief Complaint  Patient presents with  . Medication Refill    S: Reviewed CMA documentation. I have called patient and gathered additional HPI as follows:  Alicia Gibson presents to clinic for telemedicine visit for refill on cetirizine and to discuss daily prednisone.  At our last visit patient was requesting to have prednisone daily to help with her COPD and lung inflammation.  Received correspondence in collaboration with palliative care and pulmonology that it would be considered safe with her diagnosis to start on a prednisone 10mg  daily.  Discussed this with patient and sister.  Patient in agreement to begin prednisone 10mg  daily for her symptoms.   Patient is currently home Denies any high risk travel to areas of current concern for COVID19. Denies any known or suspected exposure to person with or possibly with COVID19.  Denies any fevers, chills, sweats, body ache, sinus pain or pressure, headache, abdominal pain, diarrhea  Past Medical History:  Diagnosis Date  . Allergy   . Anxiety   . Colitis    recurrent  . COPD (chronic obstructive pulmonary disease) (HCC)   . Depression    . Graves disease    s/p iodine radiation treatment - euthyroid for many years per pt  . Hyperlipidemia   . Hypertension   . Loss of eye, LEFT 2007   Social History   Tobacco Use  . Smoking status: Former Smoker    Packs/day: 0.10    Types: Cigarettes  . Smokeless tobacco: Never Used  Vaping Use  . Vaping Use: Never used  Substance Use Topics  . Alcohol use: Never  . Drug use: Never    Current Outpatient Medications:  .  albuterol (PROVENTIL) (2.5 MG/3ML) 0.083% nebulizer solution, Take 3 mLs (2.5 mg total) by nebulization 4 (four) times daily., Disp: 75 mL, Rfl: 3 .  albuterol (VENTOLIN HFA) 108 (90 Base) MCG/ACT inhaler, INHALE 2 PUFFS INTO THE LUNGS EVERY 6 HOURS AS NEEDED FOR WHEEZING OR SHORTNESS OF BREATH, Disp: 25.5 g, Rfl: 1 .  amLODipine (NORVASC) 10 MG tablet, Take 1 tablet (10 mg total) by mouth daily. (Patient taking differently: Take 5 mg by mouth daily. ), Disp: 30 tablet, Rfl: 0 .  aspirin EC 81 MG tablet, Take 81 mg by mouth daily., Disp: , Rfl:  .  atorvastatin (LIPITOR) 40 MG tablet, TAKE 1 TABLET(40 MG) BY MOUTH DAILY, Disp: 30 tablet, Rfl: 2 .  budesonide (PULMICORT) 0.5 MG/2ML nebulizer solution, Take 2 mLs (0.5 mg total) by nebulization 2 (two) times daily., Disp: 1440 mL, Rfl: 10 .  cetirizine (ZYRTEC) 10 MG tablet, Take 1 tablet (10 mg total) by mouth daily., Disp: 30 tablet, Rfl: 5 .  Cholecalciferol 50 MCG (2000 UT) TABS, Take 1 tablet by mouth daily., Disp: , Rfl:  .  cyclobenzaprine (FLEXERIL) 10 MG tablet, TAKE 1 TABLET BY MOUTH 2 TIMES DAILY AS NEEDED FOR MUSCLE SPASMS (Patient taking differently: Take 10 mg by mouth 2 (two) times daily as needed for muscle spasms. ), Disp: 60 tablet, Rfl: 5 .  formoterol (PERFOROMIST) 20 MCG/2ML nebulizer solution, Take 2 mLs (20 mcg total) by nebulization 2 (two) times daily., Disp: 1440 mL, Rfl: 10 .  guaiFENesin (MUCINEX) 600 MG 12 hr tablet, Take 2 tablets (1,200 mg total) by mouth 2 (two) times daily as needed for  cough or to loosen phlegm., Disp: 120 tablet, Rfl: 2 .  Morphine Sulfate (MORPHINE CONCENTRATE) 10 MG/0.5ML SOLN concentrated solution, Take 0.25 mLs (5 mg total) by mouth every 4 (four) hours as needed for severe pain or shortness of breath., Disp: 30 mL, Rfl: 0 .  Multiple Vitamin (MULTIVITAMIN WITH MINERALS) TABS tablet, Take 1 tablet by mouth daily., Disp: , Rfl:  .  omeprazole (PRILOSEC) 20 MG capsule, Take by mouth daily., Disp: , Rfl:  .  sertraline (ZOLOFT) 100 MG tablet, Take 1 tablet (100 mg total) by mouth daily., Disp: 90 tablet, Rfl: 1 .  predniSONE (DELTASONE) 10 MG tablet, Take 1 tablet (10 mg total) by mouth daily with breakfast., Disp: 90 tablet, Rfl: 1  Depression screen [facility] 2/9 08/19/2019 05/27/2019 11/13/2018  Decreased Interest 1 1 0  Down, Depressed, Hopeless 1 1 0  PHQ - 2 Score 2 2 0  Altered sleeping Tired, decreased energy Change in appetite 0 0 0  Feeling bad or failure about yourself  0 0 0  Trouble concentrating 0 0 0  Moving slowly or fidgety/restless 0 1 0  Suicidal thoughts 0 0 0  PHQ-9 Score Difficult doing work/chores Not difficult at all Not difficult at all Not difficult at all    GAD 7 : Generalized Anxiety Score 08/13/2018 05/14/2018  Nervous, Anxious, on Edge 1 2  Control/stop worrying 0 3  Worry too much - different things 1 3  Trouble relaxing 0 2  Restless 0 0  Easily annoyed or irritable 0 2  Afraid - awful might happen 0 3  Total GAD 7 Score 2 15  Anxiety Difficulty Not difficult at all Not difficult at all    -------------------------------------------------------------------------- O: No physical exam performed due to remote telephone encounter.  Physical Exam: Patient remotely monitored without video.  Verbal communication appropriate.  Cognition normal.  Recent Results (from the past 2160 hour(s))  CBC with Differential     Status: Abnormal   Collection Time: 06/21/19 11:23 PM  Result Value Ref Range   WBC  13.7 (H) 4.0 - 10.5 K/uL   RBC 4.63 3.87 - 5.11 MIL/uL   Hemoglobin 13.8 12.0 - 15.0 g/dL   HCT 16.1 36 - 46 %   MCV 93.5 80.0 - 100.0 fL   MCH 29.8 26.0 - 34.0 pg   MCHC 31.9 30.0 - 36.0 g/dL   RDW 09.6 04.5 - 40.9 %   Platelets 272 150 - 400 K/uL   nRBC 0.0 0.0 - 0.2 %   Neutrophils Relative % 83 %   Neutro Abs 11.5 (H) 1.7 - 7.7 K/uL   Lymphocytes Relative 7 %   Lymphs Abs 1.0 0.7 - 4.0 K/uL   Monocytes Relative 8 %   Monocytes Absolute 1.1 (H) 0 - 1 K/uL   Eosinophils Relative 1 %   Eosinophils Absolute 0.1 0 - 0 K/uL   Basophils Relative  0 %   Basophils Absolute 0.1 0 - 0 K/uL   Immature Granulocytes 1 %   Abs Immature Granulocytes 0.07 0.00 - 0.07 K/uL    Comment: Performed at [facility], [address] [place], [place] [address]  Basic metabolic panel     Status: Abnormal   Collection Time: 06/21/19 11:23 PM  Result Value Ref Range   Sodium 134 (L) 135 - 145 mmol/L   Potassium 3.9 3.5 - 5.1 mmol/L   Chloride 83 (L) 98 - 111 mmol/L   CO2 39 (H) 22 - 32 mmol/L   Glucose, Bld 126 (H) 70 - 99 mg/dL    Comment: Glucose reference range applies only to samples taken after fasting for at least 8 hours.   BUN 16 6 - 20 mg/dL   Creatinine, Ser 1.91 0.44 - 1.00 mg/dL   Calcium 9.8 8.9 - 47.8 mg/dL   GFR calc non Af Amer >60 >60 mL/min   GFR calc Af Amer >60 >60 mL/min   Anion gap 12 5 - 15    Comment: Performed at [facility], [address], [place], [place] [address]  Troponin I (High Sensitivity)     Status: Abnormal   Collection Time: 06/21/19 11:23 PM  Result Value Ref Range   Troponin I (High Sensitivity) 70 (H) <18 ng/L    Comment: (NOTE) Elevated high sensitivity troponin I (hsTnI) values and significant  changes across serial measurements may suggest ACS but many other  chronic and acute conditions are known to elevate hsTnI results.  Refer to the "Links" section for chest pain algorithms and additional  guidance. Performed at [facility], [address] [place], [place] [address]   Culture, blood (routine x 2)     Status: None   Collection Time: 06/22/19 12:05 AM   Specimen: BLOOD  Result Value Ref Range   Specimen Description BLOOD Blood Culture adequate volume    Special Requests      BOTTLES DRAWN AEROBIC AND ANAEROBIC BLOOD LEFT FOREARM   Culture      NO GROWTH 5 DAYS Performed at [facility], [address] [place], [place] [address]    Report Status 06/27/2019 FINAL   SARS Coronavirus 2 by RT PCR (hospital order, performed in [facility] hospital lab) Nasopharyngeal Nasopharyngeal Swab     Status: None   Collection Time: 06/22/19 12:05 AM   Specimen: Nasopharyngeal Swab  Result Value Ref Range   SARS Coronavirus 2 NEGATIVE NEGATIVE    Comment: (NOTE) SARS-CoV-2 target nucleic acids are NOT DETECTED. The SARS-CoV-2 RNA is generally detectable in upper and lower respiratory specimens during the acute phase of infection. The lowest concentration of SARS-CoV-2 viral copies this assay can detect is 250 copies / mL. A negative result does not preclude SARS-CoV-2 infection and should not be used as the sole basis for treatment or other patient management decisions.  A negative result may occur with improper specimen collection / handling, submission of specimen other than nasopharyngeal swab, presence of viral mutation(s) within the areas targeted by this assay, and inadequate number of viral copies (<250 copies / mL). A negative result must be combined with clinical observations, patient history, and epidemiological information. Fact Sheet for Patients:   BoilerBrush.com.cy Fact Sheet for Healthcare Providers: https://pope.com/ This test is not yet approved or cleared  by the [place] FDA and has been authorized for detection and/or diagnosis of SARS-CoV-2 by FDA under an Emergency Use Authorization (EUA).  This  EUA will remain in  effect (meaning this test can be used) for the duration of the COVID-19 declaration under Section 564(b)(1) of the Act, 21 U.S.C. section 360bbb-3(b)(1), unless the authorization is terminated or revoked sooner. Performed at [facility], [address] [place], [place] [address]   Culture, blood (routine x 2)     Status: None   Collection Time: 06/22/19 12:10 AM   Specimen: BLOOD  Result Value Ref Range   Specimen Description BLOOD Blood Culture adequate volume    Special Requests      BOTTLES DRAWN AEROBIC AND ANAEROBIC RESISTANT WRIST   Culture      NO GROWTH 5 DAYS Performed at [facility], [address], [place], [place] [address]    Report Status 06/27/2019 FINAL   Lactic acid, plasma     Status: None   Collection Time: 06/22/19  1:00 AM  Result Value Ref Range   Lactic Acid, Venous 1.0 0.5 - 1.9 mmol/L    Comment: Performed at [facility], [address], [place], [place] [address]  Troponin I (High Sensitivity)     Status: Abnormal   Collection Time: 06/22/19  1:22 AM  Result Value Ref Range   Troponin I (High Sensitivity) 60 (H) <18 ng/L    Comment: (NOTE) Elevated high sensitivity troponin I (hsTnI) values and significant  changes across serial measurements may suggest ACS but many other  chronic and acute conditions are known to elevate hsTnI results.  Refer to the "Links" section for chest pain algorithms and additional  guidance. Performed at [facility], [address] [place], [place] [address]   Comprehensive metabolic panel     Status: Abnormal   Collection Time: 06/22/19  9:24 AM  Result Value Ref Range   Sodium 135 135 - 145 mmol/L   Potassium 4.5 3.5 - 5.1 mmol/L   Chloride 84 (L) 98 - 111 mmol/L   CO2 40 (H) 22 - 32 mmol/L   Glucose, Bld 200 (H) 70 - 99 mg/dL    Comment: Glucose reference range applies only to samples taken after fasting for at least 8 hours.   BUN 21 (H) 6 - 20 mg/dL   Creatinine, Ser  4.40 0.44 - 1.00 mg/dL   Calcium 9.1 8.9 - 34.7 mg/dL   Total Protein 7.8 6.5 - 8.1 g/dL   Albumin 4.1 3.5 - 5.0 g/dL   AST 21 15 - 41 U/L   ALT 17 0 - 44 U/L   Alkaline Phosphatase 61 38 - 126 U/L   Total Bilirubin 0.6 0.3 - 1.2 mg/dL   GFR calc non Af Amer >60 >60 mL/min   GFR calc Af Amer >60 >60 mL/min   Anion gap 11 5 - 15    Comment: Performed at [facility], [address] [place], [place] [address]  Magnesium     Status: None   Collection Time: 06/22/19  9:24 AM  Result Value Ref Range   Magnesium 2.1 1.7 - 2.4 mg/dL    Comment: Performed at [facility], [address] [place], [place] [address]  Phosphorus     Status: Abnormal   Collection Time: 06/22/19  9:24 AM  Result Value Ref Range   Phosphorus 5.7 (H) 2.5 - 4.6 mg/dL    Comment: Performed at [facility], [address], [place], [place] [address]  CBC with Differential/Platelet     Status: Abnormal   Collection Time: 06/22/19  9:24 AM  Result  Value Ref Range   WBC 11.3 (H) 4.0 - 10.5 K/uL   RBC 4.33 3.87 - 5.11 MIL/uL   Hemoglobin 12.7 12.0 - 15.0 g/dL   HCT 45.4 36 - 46 %   MCV 93.8 80.0 - 100.0 fL   MCH 29.3 26.0 - 34.0 pg   MCHC 31.3 30.0 - 36.0 g/dL   RDW 09.8 11.9 - 14.7 %   Platelets 220 150 - 400 K/uL   nRBC 0.0 0.0 - 0.2 %   Neutrophils Relative % 96 %   Neutro Abs 10.8 (H) 1.7 - 7.7 K/uL   Lymphocytes Relative 2 %   Lymphs Abs 0.3 (L) 0.7 - 4.0 K/uL   Monocytes Relative 2 %   Monocytes Absolute 0.2 0 - 1 K/uL   Eosinophils Relative 0 %   Eosinophils Absolute 0.0 0 - 0 K/uL   Basophils Relative 0 %   Basophils Absolute 0.0 0 - 0 K/uL   Immature Granulocytes 0 %   Abs Immature Granulocytes 0.05 0.00 - 0.07 K/uL    Comment: Performed at [facility], [address] [place], [place] [address]  Lactic acid, plasma     Status: None   Collection Time: 06/22/19  9:24 AM  Result Value Ref Range   Lactic Acid, Venous 1.1 0.5 - 1.9 mmol/L    Comment:  Performed at [facility], [address] [place], [place] [address]  Lactic acid, plasma     Status: None   Collection Time: 06/22/19 11:33 AM  Result Value Ref Range   Lactic Acid, Venous 1.2 0.5 - 1.9 mmol/L    Comment: Performed at [facility], [address] [place], [place] [address]  HIV Antibody (routine testing w rflx)     Status: None   Collection Time: 06/22/19 11:33 AM  Result Value Ref Range   HIV Screen 4th Generation wRfx Non Reactive Non Reactive    Comment: Performed at [facility], [address] [place], [place] [address]  Comprehensive metabolic panel     Status: Abnormal   Collection Time: 06/23/19  6:02 AM  Result Value Ref Range   Sodium 135 135 - 145 mmol/L   Potassium 3.7 3.5 - 5.1 mmol/L   Chloride 85 (L) 98 - 111 mmol/L   CO2 37 (H) 22 - 32 mmol/L   Glucose, Bld 102 (H) 70 - 99 mg/dL    Comment: Glucose reference range applies only to samples taken after fasting for at least 8 hours.   BUN 23 (H) 6 - 20 mg/dL   Creatinine, Ser 5.28 0.44 - 1.00 mg/dL   Calcium 9.3 8.9 - 41.3 mg/dL   Total Protein 6.9 6.5 - 8.1 g/dL   Albumin 3.6 3.5 - 5.0 g/dL   AST 18 15 - 41 U/L   ALT 14 0 - 44 U/L   Alkaline Phosphatase 53 38 - 126 U/L   Total Bilirubin 0.8 0.3 - 1.2 mg/dL   GFR calc non Af Amer >60 >60 mL/min   GFR calc Af Amer >60 >60 mL/min   Anion gap 13 5 - 15    Comment: Performed at [facility], [address] [place], [place] [address]  CBC with Differential/Platelet     Status: Abnormal   Collection Time: 06/23/19  6:02 AM  Result Value Ref Range   WBC 11.0 (H) 4.0 - 10.5 K/uL   RBC 4.01 3.87 - 5.11 MIL/uL   Hemoglobin 11.8 (L) 12.0 - 15.0 g/dL  HCT 36.6 36 - 46 %   MCV 91.3 80.0 - 100.0 fL   MCH 29.4 26.0 - 34.0 pg   MCHC 32.2 30.0 - 36.0 g/dL   RDW 78.2 95.6 - 21.3 %   Platelets 229 150 - 400 K/uL   nRBC 0.0 0.0 - 0.2 %   Neutrophils Relative % 79 %   Neutro Abs 8.7 (H) 1.7 - 7.7 K/uL   Lymphocytes  Relative 10 %   Lymphs Abs 1.1 0.7 - 4.0 K/uL   Monocytes Relative 9 %   Monocytes Absolute 1.0 0 - 1 K/uL   Eosinophils Relative 1 %   Eosinophils Absolute 0.1 0 - 0 K/uL   Basophils Relative 0 %   Basophils Absolute 0.0 0 - 0 K/uL   Immature Granulocytes 1 %   Abs Immature Granulocytes 0.05 0.00 - 0.07 K/uL    Comment: Performed at [facility], [address], [place], [place] [address]  Magnesium     Status: None   Collection Time: 06/23/19  6:02 AM  Result Value Ref Range   Magnesium 2.1 1.7 - 2.4 mg/dL    Comment: Performed at [facility], [address], [place], [place] [address]  Phosphorus     Status: None   Collection Time: 06/23/19  6:02 AM  Result Value Ref Range   Phosphorus 2.9 2.5 - 4.6 mg/dL    Comment: Performed at [facility], [address] [place], [place] [address]  Comprehensive metabolic panel     Status: Abnormal   Collection Time: 06/24/19  7:29 AM  Result Value Ref Range   Sodium 135 135 - 145 mmol/L   Potassium 3.8 3.5 - 5.1 mmol/L   Chloride 89 (L) 98 - 111 mmol/L   CO2 35 (H) 22 - 32 mmol/L   Glucose, Bld 97 70 - 99 mg/dL    Comment: Glucose reference range applies only to samples taken after fasting for at least 8 hours.   BUN 33 (H) 6 - 20 mg/dL   Creatinine, Ser 4.13 0.44 - 1.00 mg/dL   Calcium 9.2 8.9 - 24.4 mg/dL   Total Protein 6.3 (L) 6.5 - 8.1 g/dL   Albumin 3.3 (L) 3.5 - 5.0 g/dL   AST 30 15 - 41 U/L   ALT 19 0 - 44 U/L   Alkaline Phosphatase 70 38 - 126 U/L   Total Bilirubin 0.6 0.3 - 1.2 mg/dL   GFR calc non Af Amer >60 >60 mL/min   GFR calc Af Amer >60 >60 mL/min   Anion gap 11 5 - 15    Comment: Performed at [facility], [address] [place], [place] [address]  CBC with Differential/Platelet     Status: Abnormal   Collection Time: 06/24/19  7:29 AM  Result Value Ref Range   WBC 12.1 (H) 4.0 - 10.5 K/uL   RBC 4.08 3.87 - 5.11 MIL/uL   Hemoglobin 11.9 (L) 12.0 - 15.0 g/dL   HCT  25.3 36 - 46 %   MCV 89.0 80.0 - 100.0 fL   MCH 29.2 26.0 - 34.0 pg   MCHC 32.8 30.0 - 36.0 g/dL   RDW 66.4 40.3 - 47.4 %   Platelets 248 150 - 400 K/uL   nRBC 0.0 0.0 - 0.2 %   Neutrophils Relative % 75 %   Neutro Abs 9.3 (H) 1.7 - 7.7 K/uL   Lymphocytes Relative 13 %   Lymphs Abs 1.6 0.7 - 4.0 K/uL  Monocytes Relative 7 %   Monocytes Absolute 0.8 0 - 1 K/uL   Eosinophils Relative 3 %   Eosinophils Absolute 0.3 0 - 0 K/uL   Basophils Relative 1 %   Basophils Absolute 0.1 0 - 0 K/uL   Immature Granulocytes 1 %   Abs Immature Granulocytes 0.07 0.00 - 0.07 K/uL    Comment: Performed at [facility], [address], [place], [place] [address]  Magnesium     Status: None   Collection Time: 06/24/19  7:29 AM  Result Value Ref Range   Magnesium 2.0 1.7 - 2.4 mg/dL    Comment: Performed at [facility], [address] [place], [place] [address]  Phosphorus     Status: None   Collection Time: 06/24/19  7:29 AM  Result Value Ref Range   Phosphorus 2.6 2.5 - 4.6 mg/dL    Comment: Performed at [facility], [address] [place], [place] [address]  Comprehensive metabolic panel     Status: Abnormal   Collection Time: 06/25/19  4:10 AM  Result Value Ref Range   Sodium 140 135 - 145 mmol/L   Potassium 3.6 3.5 - 5.1 mmol/L   Chloride 95 (L) 98 - 111 mmol/L   CO2 37 (H) 22 - 32 mmol/L   Glucose, Bld 91 70 - 99 mg/dL    Comment: Glucose reference range applies only to samples taken after fasting for at least 8 hours.   BUN 27 (H) 6 - 20 mg/dL   Creatinine, Ser 9.14 0.44 - 1.00 mg/dL   Calcium 8.9 8.9 - 78.2 mg/dL   Total Protein 6.3 (L) 6.5 - 8.1 g/dL   Albumin 3.4 (L) 3.5 - 5.0 g/dL   AST 33 15 - 41 U/L   ALT 20 0 - 44 U/L   Alkaline Phosphatase 52 38 - 126 U/L   Total Bilirubin 0.5 0.3 - 1.2 mg/dL   GFR calc non Af Amer >60 >60 mL/min   GFR calc Af Amer >60 >60 mL/min   Anion gap 8 5 - 15    Comment: Performed at [facility], [address] [place], [place] [address]  CBC with Differential/Platelet     Status: Abnormal   Collection Time: 06/25/19  4:10 AM  Result Value Ref Range   WBC 8.9 4.0 - 10.5 K/uL   RBC 3.72 (L) 3.87 - 5.11 MIL/uL   Hemoglobin 10.9 (L) 12.0 - 15.0 g/dL   HCT 30.8 (L) 36 - 46 %   MCV 92.7 80.0 - 100.0 fL   MCH 29.3 26.0 - 34.0 pg   MCHC 31.6 30.0 - 36.0 g/dL   RDW 65.7 84.6 - 96.2 %   Platelets 318 150 - 400 K/uL   nRBC 0.0 0.0 - 0.2 %   Neutrophils Relative % 78 %   Neutro Abs 7.0 1.7 - 7.7 K/uL   Lymphocytes Relative 12 %   Lymphs Abs 1.1 0.7 - 4.0 K/uL   Monocytes Relative 7 %   Monocytes Absolute 0.7 0 - 1 K/uL   Eosinophils Relative 2 %   Eosinophils Absolute 0.2 0 - 0 K/uL   Basophils Relative 0 %   Basophils Absolute 0.0 0 - 0 K/uL   Immature Granulocytes 1 %   Abs Immature Granulocytes 0.06 0.00 - 0.07 K/uL    Comment: Performed at [facility], [address] [place], [place] [address]  Magnesium     Status: None   Collection Time: 06/25/19  4:10 AM  Result Value Ref Range   Magnesium 2.1 1.7 - 2.4 mg/dL    Comment: Performed at [facility], [address] [place], [place] [address]  Phosphorus     Status: None   Collection Time: 06/25/19  4:10 AM  Result Value Ref Range   Phosphorus 3.7 2.5 - 4.6 mg/dL    Comment: Performed at [facility], [address] [place], [place] [address]  Lactic acid, plasma     Status: None   Collection Time: 07/10/19 11:25 AM  Result Value Ref Range   Lactic Acid, Venous 0.5 0.5 - 1.9 mmol/L    Comment: Performed at [facility], [address] [place], [place] [address]  Comprehensive metabolic panel     Status: Abnormal   Collection Time: 07/10/19 11:35 AM  Result Value Ref Range   Sodium 139 135 - 145 mmol/L   Potassium 4.4 3.5 - 5.1 mmol/L   Chloride 88 (L) 98 - 111 mmol/L   CO2 38 (H) 22 - 32 mmol/L   Glucose, Bld 124 (H) 70 - 99 mg/dL    Comment: Glucose reference range applies  only to samples taken after fasting for at least 8 hours.   BUN 14 6 - 20 mg/dL   Creatinine, Ser 1.06 0.44 - 1.00 mg/dL   Calcium 9.2 8.9 - 26.9 mg/dL   Total Protein 7.3 6.5 - 8.1 g/dL   Albumin 3.6 3.5 - 5.0 g/dL   AST 22 15 - 41 U/L   ALT 21 0 - 44 U/L   Alkaline Phosphatase 93 38 - 126 U/L   Total Bilirubin 0.5 0.3 - 1.2 mg/dL   GFR calc non Af Amer >60 >60 mL/min   GFR calc Af Amer >60 >60 mL/min   Anion gap 13 5 - 15    Comment: Performed at [facility], [address] [place], [place] [address]  Lactic acid, plasma     Status: None   Collection Time: 07/10/19 11:35 AM  Result Value Ref Range   Lactic Acid, Venous 1.2 0.5 - 1.9 mmol/L    Comment: Performed at [facility], [address] [place], [place] [address]  CBC with Differential     Status: Abnormal   Collection Time: 07/10/19 11:35 AM  Result Value Ref Range   WBC 7.1 4.0 - 10.5 K/uL   RBC 3.59 (L) 3.87 - 5.11 MIL/uL   Hemoglobin 10.4 (L) 12.0 - 15.0 g/dL   HCT 00.9 (L) 36 - 46 %   MCV 94.7 80.0 - 100.0 fL   MCH 29.0 26.0 - 34.0 pg   MCHC 30.6 30.0 - 36.0 g/dL   RDW 38.1 82.9 - 93.7 %   Platelets 322 150 - 400 K/uL   nRBC 0.0 0.0 - 0.2 %   Neutrophils Relative % 71 %   Neutro Abs 5.0 1.7 - 7.7 K/uL   Lymphocytes Relative 13 %   Lymphs Abs 0.9 0.7 - 4.0 K/uL   Monocytes Relative 8 %   Monocytes Absolute 0.5 0 - 1 K/uL   Eosinophils Relative 7 %   Eosinophils Absolute 0.5 0 - 0 K/uL   Basophils Relative 1 %   Basophils Absolute 0.1 0 - 0 K/uL   Immature Granulocytes 0 %   Abs Immature Granulocytes 0.03 0.00 - 0.07 K/uL    Comment: Performed at [facility], [address], [place], [place] [address]  Protime-INR     Status: None  Collection Time: 07/10/19 11:35 AM  Result Value Ref Range   Prothrombin Time 11.8 11.4 - 15.2 seconds   INR 0.9 0.8 - 1.2    Comment: (NOTE) INR goal varies based on device and disease states. Performed at [facility], [address] [place], [place] [address]   Culture, blood (Routine x 2)     Status: None   Collection Time: 07/10/19 11:35 AM   Specimen: BLOOD  Result Value Ref Range   Specimen Description BLOOD BLOOD LEFT FOREARM    Special Requests      BOTTLES DRAWN AEROBIC AND ANAEROBIC Blood Culture results may not be optimal due to an excessive volume of blood received in culture bottles   Culture      NO GROWTH 5 DAYS Performed at [facility], [address] [place], [place] [address]    Report Status 07/15/2019 FINAL   Troponin I (High Sensitivity)     Status: None   Collection Time: 07/10/19 11:35 AM  Result Value Ref Range   Troponin I (High Sensitivity) 10 <18 ng/L    Comment: (NOTE) Elevated high sensitivity troponin I (hsTnI) values and significant  changes across serial measurements may suggest ACS but many other  chronic and acute conditions are known to elevate hsTnI results.  Refer to the "Links" section for chest pain algorithms and additional  guidance. Performed at [facility], [address] [place], [place] [address]   Brain natriuretic peptide     Status: None   Collection Time: 07/10/19 11:35 AM  Result Value Ref Range   B Natriuretic Peptide 20.2 0.0 - 100.0 pg/mL    Comment: Performed at [facility], [address] [place], [place] [address]  Culture, blood (Routine X 2) w Reflex to ID Panel     Status: None   Collection Time: 07/10/19  5:43 PM   Specimen: BLOOD  Result Value Ref Range   Specimen Description BLOOD BLOOD LEFT WRIST    Special Requests      BOTTLES DRAWN AEROBIC AND ANAEROBIC Blood Culture adequate volume   Culture      NO GROWTH 5 DAYS Performed at [facility], [address] [place], [place] [address]    Report Status 07/15/2019 FINAL   SARS Coronavirus 2 by RT PCR (hospital order, performed in [facility] hospital lab) Nasopharyngeal Nasopharyngeal Swab     Status: None   Collection Time: 07/10/19  5:45  PM   Specimen: Nasopharyngeal Swab  Result Value Ref Range   SARS Coronavirus 2 NEGATIVE NEGATIVE    Comment: (NOTE) SARS-CoV-2 target nucleic acids are NOT DETECTED.  The SARS-CoV-2 RNA is generally detectable in upper and lower respiratory specimens during the acute phase of infection. The lowest concentration of SARS-CoV-2 viral copies this assay can detect is 250 copies / mL. A negative result does not preclude SARS-CoV-2 infection and should not be used as the sole basis for treatment or other patient management decisions.  A negative result may occur with improper specimen collection / handling, submission of specimen other than nasopharyngeal swab, presence of viral mutation(s) within the areas targeted by this assay, and inadequate number of viral copies (<250 copies / mL). A negative result must be combined with clinical observations, patient history, and epidemiological information.  Fact Sheet for Patients:   BoilerBrush.com.cy  Fact Sheet for Healthcare Providers: https://pope.com/  This test is not yet approved or  cleared by the [place] FDA and has been authorized  for detection and/or diagnosis of SARS-CoV-2 by FDA under an Emergency Use Authorization (EUA).  This EUA will remain in effect (meaning this test can be used) for the duration of the COVID-19 declaration under Section 564(b)(1) of the Act, 21 U.S.C. section 360bbb-3(b)(1), unless the authorization is terminated or revoked sooner.  Performed at [facility], [address] [place], [place] [address]   Troponin I (High Sensitivity)     Status: None   Collection Time: 07/10/19  7:32 PM  Result Value Ref Range   Troponin I (High Sensitivity) 8 <18 ng/L    Comment: (NOTE) Elevated high sensitivity troponin I (hsTnI) values and significant  changes across serial measurements may suggest ACS but many other  chronic and acute conditions are  known to elevate hsTnI results.  Refer to the "Links" section for chest pain algorithms and additional  guidance. Performed at [facility], [address] [place], [place] [address]   Procalcitonin - Baseline     Status: None   Collection Time: 07/10/19  7:32 PM  Result Value Ref Range   Procalcitonin <0.10 ng/mL    Comment:        Interpretation: PCT (Procalcitonin) <= 0.5 ng/mL: Systemic infection (sepsis) is not likely. Local bacterial infection is possible. (NOTE)       Sepsis PCT Algorithm           Lower Respiratory Tract                                      Infection PCT Algorithm    ----------------------------     ----------------------------         PCT < 0.25 ng/mL                PCT < 0.10 ng/mL          Strongly encourage             Strongly discourage   discontinuation of antibiotics    initiation of antibiotics    ----------------------------     -----------------------------       PCT 0.25 - 0.50 ng/mL            PCT 0.10 - 0.25 ng/mL               OR       >80% decrease in PCT            Discourage initiation of                                            antibiotics      Encourage discontinuation           of antibiotics    ----------------------------     -----------------------------         PCT >= 0.50 ng/mL              PCT 0.26 - 0.50 ng/mL               AND        <80% decrease in PCT             Encourage initiation of  antibiotics       Encourage continuation           of antibiotics    ----------------------------     -----------------------------        PCT >= 0.50 ng/mL                  PCT > 0.50 ng/mL               AND         increase in PCT                  Strongly encourage                                      initiation of antibiotics    Strongly encourage escalation           of antibiotics                                     -----------------------------                                            PCT <= 0.25 ng/mL                                                 OR                                        > 80% decrease in PCT                                      Discontinue / Do not initiate                                             antibiotics  Performed at [facility], [address] [place], [place] [address]   Basic metabolic panel     Status: Abnormal   Collection Time: 07/11/19  6:11 AM  Result Value Ref Range   Sodium 139 135 - 145 mmol/L   Potassium 3.9 3.5 - 5.1 mmol/L   Chloride 91 (L) 98 - 111 mmol/L   CO2 36 (H) 22 - 32 mmol/L   Glucose, Bld 158 (H) 70 - 99 mg/dL    Comment: Glucose reference range applies only to samples taken after fasting for at least 8 hours.   BUN 15 6 - 20 mg/dL   Creatinine, Ser 6.04 0.44 - 1.00 mg/dL   Calcium 9.1 8.9 - 54.0 mg/dL   GFR calc non Af Amer >60 >60 mL/min   GFR calc Af Amer >60 >60 mL/min   Anion gap 12 5 - 15    Comment: Performed at [facility], [address] [place], [place] [address]  Fibrin derivatives  [facility] only)     Status: Abnormal   Collection Time: 07/11/19  4:18 PM  Result Value Ref Range   Fibrin derivatives D-dimer ([facility]) 1,932.04 (H) 0.00 - 499.00 ng/mL (FEU)    Comment: (NOTE) <> Exclusion of Venous Thromboembolism (VTE) - OUTPATIENT ONLY   (Emergency Department or [place])    0-499 ng/ml (FEU): With a low to intermediate pretest probability                      for VTE this test result excludes the diagnosis                      of VTE.   >499 ng/ml (FEU) : VTE not excluded; additional work up for VTE is                      required.  <> Testing on Inpatients and Evaluation of Disseminated Intravascular   Coagulation (DIC) Reference Range:   0-499 ng/ml (FEU) Performed at [facility], [address] [place], [place] [address]   CBC with Differential     Status: Abnormal   Collection Time: 08/01/19  4:49 PM  Result Value Ref Range   WBC  9.3 4.0 - 10.5 K/uL   RBC 2.21 (L) 3.87 - 5.11 MIL/uL   Hemoglobin 6.3 (L) 12.0 - 15.0 g/dL   HCT 09.3 (L) 36 - 46 %   MCV 94.6 80.0 - 100.0 fL   MCH 28.5 26.0 - 34.0 pg   MCHC 30.1 30.0 - 36.0 g/dL   RDW 23.5 57.3 - 22.0 %   Platelets 175 150 - 400 K/uL   nRBC 0.0 0.0 - 0.2 %   Neutrophils Relative % 93 %   Neutro Abs 8.6 (H) 1.7 - 7.7 K/uL   Lymphocytes Relative 4 %   Lymphs Abs 0.3 (L) 0.7 - 4.0 K/uL   Monocytes Relative 3 %   Monocytes Absolute 0.3 0 - 1 K/uL   Eosinophils Relative 0 %   Eosinophils Absolute 0.0 0 - 0 K/uL   Basophils Relative 0 %   Basophils Absolute 0.0 0 - 0 K/uL   Immature Granulocytes 0 %   Abs Immature Granulocytes 0.04 0.00 - 0.07 K/uL    Comment: Performed at [facility], [address] [place], [place] [address]  Basic metabolic panel     Status: Abnormal   Collection Time: 08/01/19  4:49 PM  Result Value Ref Range   Sodium 141 135 - 145 mmol/L   Potassium 4.8 3.5 - 5.1 mmol/L   Chloride 92 (L) 98 - 111 mmol/L   CO2 39 (H) 22 - 32 mmol/L   Glucose, Bld 135 (H) 70 - 99 mg/dL    Comment: Glucose reference range applies only to samples taken after fasting for at least 8 hours.   BUN 18 6 - 20 mg/dL   Creatinine, Ser 0.62 0.44 - 1.00 mg/dL   Calcium 8.8 (L) 8.9 - 10.3 mg/dL   GFR calc non Af Amer >60 >60 mL/min   GFR calc Af Amer >60 >60 mL/min   Anion gap 10 5 - 15    Comment: Performed at [facility], [address] [place], [place] [address]  Brain natriuretic peptide     Status: Abnormal   Collection Time: 08/01/19  4:49 PM  Result Value Ref Range   B Natriuretic Peptide 135.6 (H) 0.0 - 100.0 pg/mL  Comment: Performed at [facility], [address] [place], [place] [address]  Troponin I (High Sensitivity)     Status: Abnormal   Collection Time: 08/01/19  4:49 PM  Result Value Ref Range   Troponin I (High Sensitivity) 18 (H) <18 ng/L    Comment: (NOTE) Elevated high sensitivity troponin I (hsTnI)  values and significant  changes across serial measurements may suggest ACS but many other  chronic and acute conditions are known to elevate hsTnI results.  Refer to the "Links" section for chest pain algorithms and additional  guidance. Performed at [facility], [address] [place], [place] [address]   Lactic acid, plasma     Status: None   Collection Time: 08/01/19  4:49 PM  Result Value Ref Range   Lactic Acid, Venous 0.6 0.5 - 1.9 mmol/L    Comment: Performed at [facility], [address] [place], [place] [address]  CBC     Status: Abnormal   Collection Time: 08/01/19  5:49 PM  Result Value Ref Range   WBC 6.8 4.0 - 10.5 K/uL   RBC 1.79 (L) 3.87 - 5.11 MIL/uL   Hemoglobin 5.1 (L) 12.0 - 15.0 g/dL   HCT 91.4 (L) 36 - 46 %   MCV 96.1 80.0 - 100.0 fL   MCH 28.5 26.0 - 34.0 pg   MCHC 29.7 (L) 30.0 - 36.0 g/dL   RDW 78.2 95.6 - 21.3 %   Platelets 142 (L) 150 - 400 K/uL   nRBC 0.0 0.0 - 0.2 %    Comment: Performed at [facility], [address] [place], [place] [address]  Blood culture (routine x 2)     Status: None   Collection Time: 08/01/19  5:49 PM   Specimen: BLOOD  Result Value Ref Range   Specimen Description BLOOD RIGHT ANTECUBITAL    Special Requests      BOTTLES DRAWN AEROBIC AND ANAEROBIC Blood Culture adequate volume   Culture      NO GROWTH 5 DAYS Performed at [facility], [address] [place], [place] [address]    Report Status 08/06/2019 FINAL   ABO/Rh     Status: None   Collection Time: 08/01/19  5:49 PM  Result Value Ref Range   ABO/RH(D)      A POS Performed at [facility], [address] [place], [place] [address]   Blood culture (routine x 2)     Status: None   Collection Time: 08/01/19  5:54 PM   Specimen: BLOOD  Result Value Ref Range   Specimen Description BLOOD LEFT ANTECUBITAL    Special Requests      BOTTLES DRAWN AEROBIC AND ANAEROBIC Blood Culture adequate volume   Culture       NO GROWTH 5 DAYS Performed at [facility], [address] [place], [place] [address]    Report Status 08/06/2019 FINAL   Type and screen     Status: None   Collection Time: 08/01/19  6:25 PM  Result Value Ref Range   ABO/RH(D) A POS    Antibody Screen NEG    Sample Expiration 08/04/2019,2359    Unit Number [id number]    Blood Component Type RED CELLS,LR    Unit division 00    Status of Unit ISSUED,FINAL    Transfusion Status OK TO TRANSFUSE    Crossmatch Result      Compatible Performed at [facility], [address] [place], [place] [address]  Unit Number [id number]    Blood Component Type RED CELLS,LR    Unit division 00    Status of Unit ISSUED,FINAL    Transfusion Status OK TO TRANSFUSE    Crossmatch Result Compatible   BPAM RBC     Status: None   Collection Time: 08/01/19  6:25 PM  Result Value Ref Range   ISSUE DATE / TIME [id number]    Blood Product Unit Number [id number]    PRODUCT CODE [id number]    Unit Type and Rh 6200    Blood Product Expiration Date [id number]    ISSUE DATE / TIME [id number]    Blood Product Unit Number [id number]    PRODUCT CODE [id number]    Unit Type and Rh 6200    Blood Product Expiration Date [id number]   SARS Coronavirus 2 by RT PCR (hospital order, performed in [facility] hospital lab) Nasopharyngeal Nasopharyngeal Swab     Status: None   Collection Time: 08/01/19  6:30 PM   Specimen: Nasopharyngeal Swab  Result Value Ref Range   SARS Coronavirus 2 NEGATIVE NEGATIVE    Comment: (NOTE) SARS-CoV-2 target nucleic acids are NOT DETECTED.  The SARS-CoV-2 RNA is generally detectable in upper and lower respiratory specimens during the acute phase of infection. The lowest concentration of SARS-CoV-2 viral copies this assay can detect is 250 copies / mL. A negative result does not preclude SARS-CoV-2 infection and should not be used as the sole basis for treatment or other patient management  decisions.  A negative result may occur with improper specimen collection / handling, submission of specimen other than nasopharyngeal swab, presence of viral mutation(s) within the areas targeted by this assay, and inadequate number of viral copies (<250 copies / mL). A negative result must be combined with clinical observations, patient history, and epidemiological information.  Fact Sheet for Patients:   BoilerBrush.com.cyhttps://www.fda.gov/media/136312/download  Fact Sheet for Healthcare Providers: https://pope.com/https://www.fda.gov/media/136313/download  This test is not yet approved or  cleared by the [place] FDA and has been authorized for detection and/or diagnosis of SARS-CoV-2 by FDA under an Emergency Use Authorization (EUA).  This EUA will remain in effect (meaning this test can be used) for the duration of the COVID-19 declaration under Section 564(b)(1) of the Act, 21 U.S.C. section 360bbb-3(b)(1), unless the authorization is terminated or revoked sooner.  Performed at [facility], [address] [place], [place] [address]   Prepare RBC (crossmatch)     Status: None   Collection Time: 08/01/19  8:00 PM  Result Value Ref Range   Order Confirmation      ORDER PROCESSED BY BLOOD BANK Performed at [facility], [address] [place], [place] [address]   Troponin I (High Sensitivity)     Status: None   Collection Time: 08/01/19 10:26 PM  Result Value Ref Range   Troponin I (High Sensitivity) 17 <18 ng/L    Comment: (NOTE) Elevated high sensitivity troponin I (hsTnI) values and significant  changes across serial measurements may suggest ACS but many other  chronic and acute conditions are known to elevate hsTnI results.  Refer to the "Links" section for chest pain algorithms and additional  guidance. Performed at [facility], [address] [place], [place] [address]   HIV Antibody (routine testing w rflx)     Status: None   Collection Time: 08/01/19  10:26 PM  Result Value Ref Range   HIV Screen 4th Generation wRfx Non Reactive Non Reactive    Comment: Performed at  [facility], [address], [place], [place] [address]  Hemoglobin and hematocrit, blood     Status: None   Collection Time: 08/02/19  9:37 AM  Result Value Ref Range   Hemoglobin 12.8 12.0 - 15.0 g/dL    Comment: REPEATED TO VERIFY   HCT 41.3 36 - 46 %    Comment: Performed at [facility], [address] [place], [place] [address]  Blood gas, arterial     Status: Abnormal   Collection Time: 08/02/19 10:02 AM  Result Value Ref Range   FIO2 0.36    Delivery systems NASAL CANNULA    pH, Arterial 7.51 (H) 7.35 - 7.45   pCO2 arterial 54 (H) 32 - 48 mmHg   pO2, Arterial 53 (L) 83 - 108 mmHg   Bicarbonate 43.1 (H) 20.0 - 28.0 mmol/L   Acid-Base Excess 17.3 (H) 0.0 - 2.0 mmol/L   O2 Saturation 90.3 %   Patient temperature 37.0    Collection site RIGHT BRACHIAL    Sample type ARTERIAL DRAW    Allens test (pass/fail) PASS PASS    Comment: Performed at [facility], [address] [place], [place] [address]  Basic metabolic panel     Status: Abnormal   Collection Time: 08/03/19  4:32 AM  Result Value Ref Range   Sodium 140 135 - 145 mmol/L   Potassium 3.6 3.5 - 5.1 mmol/L   Chloride 95 (L) 98 - 111 mmol/L   CO2 33 (H) 22 - 32 mmol/L   Glucose, Bld 146 (H) 70 - 99 mg/dL    Comment: Glucose reference range applies only to samples taken after fasting for at least 8 hours.   BUN 19 6 - 20 mg/dL   Creatinine, Ser 9.14 0.44 - 1.00 mg/dL   Calcium 9.1 8.9 - 78.2 mg/dL   GFR calc non Af Amer >60 >60 mL/min   GFR calc Af Amer >60 >60 mL/min   Anion gap 12 5 - 15    Comment: Performed at [facility], [address] [place], [place] [address]  CBC     Status: Abnormal   Collection Time: 08/03/19  6:18 AM  Result Value Ref Range   WBC 11.9 (H) 4.0 - 10.5 K/uL   RBC 4.54 3.87 - 5.11 MIL/uL   Hemoglobin 12.4 12.0 - 15.0 g/dL   HCT  30.8 36 - 46 %   MCV 85.5 80.0 - 100.0 fL    Comment: POST TRANSFUSION SPECIMEN   MCH 27.3 26.0 - 34.0 pg   MCHC 32.0 30.0 - 36.0 g/dL   RDW 65.7 84.6 - 96.2 %   Platelets 301 150 - 400 K/uL    Comment: POST TRANSFUSION SPECIMEN   nRBC 0.0 0.0 - 0.2 %    Comment: Performed at [facility], [address] [place], [place] [address]  CBC     Status: Abnormal   Collection Time: 08/04/19  3:57 AM  Result Value Ref Range   WBC 13.8 (H) 4.0 - 10.5 K/uL   RBC 4.93 3.87 - 5.11 MIL/uL   Hemoglobin 13.5 12.0 - 15.0 g/dL   HCT 13.2 36 - 46 %   MCV 87.8 80.0 - 100.0 fL   MCH 27.4 26.0 - 34.0 pg   MCHC 31.2 30.0 - 36.0 g/dL   RDW 44.0 10.2 - 72.5 %   Platelets 334 150 - 400 K/uL   nRBC 0.0 0.0 - 0.2 %    Comment: Performed at  [facility], [address] [place], [place] [address]  Basic metabolic panel     Status: Abnormal   Collection Time: 08/04/19  3:57 AM  Result Value Ref Range   Sodium 139 135 - 145 mmol/L   Potassium 4.6 3.5 - 5.1 mmol/L   Chloride 96 (L) 98 - 111 mmol/L   CO2 32 22 - 32 mmol/L   Glucose, Bld 142 (H) 70 - 99 mg/dL    Comment: Glucose reference range applies only to samples taken after fasting for at least 8 hours.   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  Received message from both palliative care NP and pulmonologist with recommendations and in collaboration will start patient on prednisone 10mg  daily.    Plan: 1.  Begin prednisone 10mg  daily 2. Schedule follow up visit with pulmonology 3. Keep scheduled appointment with palliative care 4. RTC in 1 month for re-evaluation, sooner, should the need arise.      Relevant Medications   predniSONE (DELTASONE) 10 MG tablet   cetirizine (ZYRTEC) 10 MG tablet   Seasonal allergic rhinitis due to pollen    Stable with current treatment of cetirizine daily.    Plan: 1. Refill on cetirizine 10mg  sent to pharmacy on file.      Relevant Medications   cetirizine (ZYRTEC) 10 MG tablet      Meds ordered this encounter  Medications  . predniSONE (DELTASONE) 10 MG tablet    Sig: Take 1 tablet (10 mg total) by mouth daily with breakfast.    Dispense:  90 tablet    Refill:  1  . cetirizine (ZYRTEC) 10 MG tablet    Sig: Take 1 tablet (10 mg total) by mouth daily.    Dispense:  30 tablet    Refill:  5    Follow-up: - Return in 1 month for COPD re-evaluation  Patient verbalizes understanding with the above medical recommendations including the limitation of remote medical advice.  Specific follow-up and call-back criteria were given for patient to follow-up or seek medical care more urgently if needed.  - Time spent in direct consultation with patient on phone: 11 minutes  Charlaine Dalton, FNP-C [facility] 09/03/2019, 11:01 AM

## 2019-09-03 NOTE — Telephone Encounter (Signed)
We do not currently have any availability. Will discuss with Dr. Jayme Cloud.

## 2019-09-03 NOTE — Progress Notes (Signed)
She is likely on "recall" list.  We will try to get her a follow-up appointment.  See no reason why she could not be on at least 10 mg of prednisone daily.

## 2019-09-03 NOTE — Assessment & Plan Note (Signed)
To begin long term prednisone therapy at 10mg  daily.  Received message from both palliative care NP and pulmonologist with recommendations and in collaboration will start patient on prednisone 10mg  daily.    Plan: 1. Begin prednisone 10mg  daily 2. Schedule follow up visit with pulmonology 3. Keep scheduled appointment with palliative care 4. RTC in 1 month for re-evaluation, sooner, should the need arise.

## 2019-09-03 NOTE — Progress Notes (Signed)
Agree with recommendations.  She really has followed previously with [name].  I have just recently had to see her while she was in the hospital.  Anything that will help her comfort should work.

## 2019-09-03 NOTE — Assessment & Plan Note (Signed)
Stable with current treatment of cetirizine daily.    Plan: 1. Refill on cetirizine 10mg  sent to pharmacy on file.

## 2019-09-03 NOTE — Telephone Encounter (Signed)
-----   Message from Salena Saner, MD sent at 09/03/2019  9:32 AM EDT -----   ----- Message ----- From: [name], FNP Sent: 08/27/2019   9:59 AM EDT To: Eliezer Lofts, NP, #  Hi Dr. Jayme Cloud and Florentina Addison,     Ms. Mcglown is on palliative care, as you know, and is requesting to be on prednisone long term.  She is my first palliative care patient in family practice and was hoping to gain some recommendations/input on this.  I encouraged her to contact Dr. Georgann Housekeeper office for her pulmonary follow up visit, as I do not see one scheduled.Thanks in advance,Nicole

## 2019-09-05 NOTE — Telephone Encounter (Signed)
Patient has been schedule with Georgeanna Lea 10/07/2019 at 2:30. Patient is aware and voiced her understanding.  Nothing further is needed.

## 2019-09-09 ENCOUNTER — Other Ambulatory Visit: Payer: Self-pay

## 2019-09-09 ENCOUNTER — Other Ambulatory Visit: Payer: Self-pay | Admitting: Family Medicine

## 2019-09-09 ENCOUNTER — Other Ambulatory Visit: Payer: Medicaid Other | Admitting: Primary Care

## 2019-09-09 DIAGNOSIS — Z515 Encounter for palliative care: Secondary | ICD-10-CM

## 2019-09-09 DIAGNOSIS — J9611 Chronic respiratory failure with hypoxia: Secondary | ICD-10-CM

## 2019-09-09 DIAGNOSIS — J449 Chronic obstructive pulmonary disease, unspecified: Secondary | ICD-10-CM

## 2019-09-09 DIAGNOSIS — J4489 Other specified chronic obstructive pulmonary disease: Secondary | ICD-10-CM

## 2019-09-09 DIAGNOSIS — F17200 Nicotine dependence, unspecified, uncomplicated: Secondary | ICD-10-CM

## 2019-09-09 DIAGNOSIS — R0602 Shortness of breath: Secondary | ICD-10-CM

## 2019-09-09 MED ORDER — MORPHINE SULFATE ER 15 MG PO TBCR: 15.0000 mg | EXTENDED_RELEASE_TABLET | Freq: Two times a day (BID) | ORAL | 0 refills | Status: DC | PRN

## 2019-09-09 MED ORDER — NICOTINE 14 MG/24HR TD PT24: 14.0000 mg | MEDICATED_PATCH | Freq: Every day | TRANSDERMAL | 0 refills | Status: AC

## 2019-09-09 NOTE — Progress Notes (Signed)
[facility] Consult Note Telephone: [phone]  Fax: [phone]  PATIENT NAME: Alicia Gibson [address] [place] [address] [phone] (home)  DOB: 02-11-1960 MRN: [id number]  PRIMARY CARE PROVIDER:    Verl Bangs, [place],  [place] [address] [phone]  REFERRING PROVIDER:   Verl Bangs, [place],  [place]  [address] [phone]  RESPONSIBLE PARTY:   Extended Emergency Contact Information Primary Emergency Contact: CARNES,ROBIN Address: [address]          [place], [place] [address] Mobile Phone: [phone] Relation: Friend Secondary Emergency Contact: [place], [place] Phone: [phone] Mobile Phone: [phone] Relation: Sister  I met face to face with patient and family in home.  ASSESSMENT AND RECOMMENDATIONS:   1. Advance Care Planning/Goals of Care: Goals include to maximize quality of life and symptom management. Our advance care planning conversation included a discussion about:     The value and importance of advance care planning   Exploration of personal, cultural or spiritual beliefs that might influence medical decisions   Exploration of goals of care in the event of a sudden injury or illness   Identification and preparation of a healthcare agent   Review and updating or creation of an  advance directive document .  Reviewed MOST and created. Pt chose DNR, Use of abx,  Use if IV, no feeding tube. She discussed with her sisters and chose these directives  2. Symptom Management:    Dyspnea:  Endorses DOE, taking nebs q 6 hrs for rescue. Started on 10 mg prednisone, had had burst in hospital. Oxygen is 96%, HR 93, 145/90. Previous 136/81. Has received the paperwork for handicap placard.   Morphine is currently being taken at 1-4 times a day. She states she is not taking due to concerns about drug addiction. We discussed addiction and how she was  taking this for a medical condition. I encouraged her to take what is needed and keep log, and refill current bottle of roxanol. A 30 ml bottle would last about 20 days at a dose of 5 mg q 4 hrs. (6/day). Recommend switch to Morphine ER tab, 15 mg bid.Some liquid may be useful to have for prn break through use.  BP: WNL. Discussed the s/sx for pneumonia. Has home use device.  Smoking Cessation: Patch @ 21 mg;  Discussed weaning to 14 mg soon as she's been on 14 mg. Recommend to send a prescription for the 14 mg patches to [facility] in [place].  3. Follow up Palliative Care Visit: Palliative care will continue to follow for goals of care clarification and symptom management. Return 4 weeks or prn.  4. Family /Caregiver/Community Supports: Lives with roommate and sister. These people are her caregivers.  5. Cognitive / Functional decline: A and O x 3. Endorses great DOE. Needs assist with all adls.  I spent 60 minutes providing this consultation,  from 1500 to 1600. More than 50% of the time in this consultation was spent coordinating communication.   CHIEF COMPLAINT:DOE  HISTORY OF PRESENT ILLNESS:  Alicia Gibson is a 59 y.o. year old female with multiple medical problems including COPD, oxygen dependency, pain, anxiety. Palliative Care was asked to follow this patient by consultation request of Malfi, Lupita Raider, FNP to help address advance care planning and goals of care. This is a follow up visit.  CODE STATUS: DNR  PPS: 40%  HOSPICE ELIGIBILITY/DIAGNOSIS: TBD  PAST MEDICAL  HISTORY:  Past Medical History:  Diagnosis Date  . Allergy   . Anxiety   . Colitis    recurrent  . COPD (chronic obstructive pulmonary disease) ([place])   . Depression   . Graves disease    s/p iodine radiation treatment - euthyroid for many years per pt  . Hyperlipidemia   . Hypertension   . Loss of eye, LEFT 2007    SOCIAL HX:  Social History   Tobacco Use  . Smoking status: Former Smoker    Packs/day:  0.10    Types: Cigarettes  . Smokeless tobacco: Never Used  Substance Use Topics  . Alcohol use: Never   FAMILY HX:  Family History  Problem Relation Age of Onset  . Heart disease Father   . Alcohol abuse Father   . Alcohol abuse Brother   . Heart failure Sister   . Celiac disease Sister   . Healthy Sister     ALLERGIES:  Allergies  Allergen Reactions  . Contrast Media [Iodinated Diagnostic Agents] Anaphylaxis  . Iodine Anaphylaxis  . Codeine Hives and Nausea And Vomiting  . Shellfish Allergy Hives  . Penicillins Hives and Rash     PERTINENT MEDICATIONS:  Outpatient Encounter Medications as of 09/09/2019  Medication Sig  . albuterol (PROVENTIL) (2.5 MG/3ML) 0.083% nebulizer solution Take 3 mLs (2.5 mg total) by nebulization 4 (four) times daily.  Marland Kitchen albuterol (VENTOLIN HFA) 108 (90 Base) MCG/ACT inhaler INHALE 2 PUFFS INTO THE LUNGS EVERY 6 HOURS AS NEEDED FOR WHEEZING OR SHORTNESS OF BREATH  . amLODipine (NORVASC) 10 MG tablet Take 1 tablet (10 mg total) by mouth daily. (Patient taking differently: Take 5 mg by mouth daily. )  . aspirin EC 81 MG tablet Take 81 mg by mouth daily.  Marland Kitchen atorvastatin (LIPITOR) 40 MG tablet TAKE 1 TABLET(40 MG) BY MOUTH DAILY  . budesonide (PULMICORT) 0.5 MG/2ML nebulizer solution Take 2 mLs (0.5 mg total) by nebulization 2 (two) times daily.  . cetirizine (ZYRTEC) 10 MG tablet Take 1 tablet (10 mg total) by mouth daily.  . Cholecalciferol 50 MCG (2000 UT) TABS Take 1 tablet by mouth daily.  . cyclobenzaprine (FLEXERIL) 10 MG tablet TAKE 1 TABLET BY MOUTH 2 TIMES DAILY AS NEEDED FOR MUSCLE SPASMS (Patient taking differently: Take 10 mg by mouth 2 (two) times daily as needed for muscle spasms. )  . formoterol (PERFOROMIST) 20 MCG/2ML nebulizer solution Take 2 mLs (20 mcg total) by nebulization 2 (two) times daily.  Marland Kitchen guaiFENesin (MUCINEX) 600 MG 12 hr tablet Take 2 tablets (1,200 mg total) by mouth 2 (two) times daily as needed for cough or to loosen  phlegm.  . Morphine Sulfate (MORPHINE CONCENTRATE) 10 MG/0.5ML SOLN concentrated solution Take 0.25 mLs (5 mg total) by mouth every 4 (four) hours as needed for severe pain or shortness of breath.  . Multiple Vitamin (MULTIVITAMIN WITH MINERALS) TABS tablet Take 1 tablet by mouth daily.  Marland Kitchen omeprazole (PRILOSEC) 20 MG capsule Take by mouth daily.  . predniSONE (DELTASONE) 10 MG tablet Take 1 tablet (10 mg total) by mouth daily with breakfast.  . sertraline (ZOLOFT) 100 MG tablet Take 1 tablet (100 mg total) by mouth daily.   No facility-administered encounter medications on file as of 09/09/2019.    PHYSICAL EXAM / ROS:   Current and past weights: 128 lbs. General: NAD, frail appearing, thin, endorses sinus congestion. Afebrile today. Cardiovascular: no chest pain reported, no edema  Pulmonary: no cough, no increased SOB, 10 mg  prednisone ongoing starting today, oxygen at 4L, 96% oxygen, endorses being tight chested. Abdomen: appetite fair,  continent of bowel GU: denies dysuria, continent of urine MSK:  ++ joint and ROM abnormalities, ambulatory with stand by Skin: no rashes or wounds reported Neurological: Weakness, pain  Jason Coop, NP , DNP, MPH, [facility]  COVID-19 PATIENT SCREENING TOOL  Person answering questions: ____________Maryann______ _____   1.  Is the patient or any family member in the home showing any signs or symptoms regarding respiratory infection?               Person with Symptom- __________self_________________  a. Fever                                                                          Yes___ No___          ___________________  b. Shortness of breath                                                    Yes___ No___          ___________________ c. Cough/congestion                                       Yes___  No___         ___________________ d. Body aches/pains                                                         Yes___ No___         ____________________ e. Gastrointestinal symptoms (diarrhea, nausea)           Yes___ No___        ____________________  2. Within the past 14 days, has anyone living in the home had any contact with someone with or under investigation for COVID-19?    Yes___ No_X_   Person __________________

## 2019-09-09 NOTE — Progress Notes (Signed)
Spoke with Palliative Care NP about reducing nicoderm CQ dosage from 21mg  to 14mg /24hr.  To switch liquid morphine to MS Contin 15mg  Q12H as needed for pain, 60 tablets for 30 days.  Will send in Rx to pharmacy on file

## 2019-09-10 ENCOUNTER — Other Ambulatory Visit: Payer: Self-pay | Admitting: Nurse Practitioner

## 2019-09-10 DIAGNOSIS — F32A Depression, unspecified: Secondary | ICD-10-CM

## 2019-09-10 DIAGNOSIS — F329 Major depressive disorder, single episode, unspecified: Secondary | ICD-10-CM

## 2019-09-11 ENCOUNTER — Telehealth: Payer: Self-pay

## 2019-09-11 NOTE — Telephone Encounter (Signed)
-----   Message from [name], FNP sent at 09/10/2019  7:46 AM EDT ----- Regarding: Patient appt in office Can we get patient scheduled for an in office visit this week/early next week?  We need to get a pain management contract signed.  Please and TY

## 2019-09-11 NOTE — Telephone Encounter (Signed)
The pt was notified of the providers request. She verbalize understanding, appt scheduled for Tuesday, August 24th.

## 2019-09-16 ENCOUNTER — Telehealth: Payer: Self-pay | Admitting: General Practice

## 2019-09-16 ENCOUNTER — Ambulatory Visit: Payer: Self-pay | Admitting: General Practice

## 2019-09-16 DIAGNOSIS — J4489 Other specified chronic obstructive pulmonary disease: Secondary | ICD-10-CM

## 2019-09-16 DIAGNOSIS — J449 Chronic obstructive pulmonary disease, unspecified: Secondary | ICD-10-CM

## 2019-09-16 DIAGNOSIS — F17200 Nicotine dependence, unspecified, uncomplicated: Secondary | ICD-10-CM

## 2019-09-16 DIAGNOSIS — Z515 Encounter for palliative care: Secondary | ICD-10-CM

## 2019-09-16 DIAGNOSIS — R0602 Shortness of breath: Secondary | ICD-10-CM

## 2019-09-16 DIAGNOSIS — I10 Essential (primary) hypertension: Secondary | ICD-10-CM

## 2019-09-16 NOTE — Chronic Care Management (AMB) (Signed)
Care Management   Follow Up Note   09/16/2019 Name: Tameeka Luo MRN: [id number] DOB: 1960-09-18  Referred by: [name], FNP Reason for referral : Care Coordination (Follow up RNCM Chronic Disease Management and Care Coordiantion Needs)   Jarica Plass is a 59 y.o. year old female who is a primary care patient of [name], FNP. The care management team was consulted for assistance with care management and care coordination needs.    Review of patient status, including review of consultants reports, relevant laboratory and other test results, and collaboration with appropriate care team members and the patient's provider was performed as part of comprehensive patient evaluation and provision of chronic care management services.    SDOH (Social Determinants of Health) assessments performed: Yes See Care Plan activities for detailed interventions related to [facility])     Advanced Directives: See Care Plan and Vynca application for related entries.   Goals Addressed              This Visit's Progress   .  RNCM: Pt-"I am setting goals for myself" (pt-stated)        CARE PLAN ENTRY (see longtitudinal plan of care for additional care plan information)  Current Barriers:  . Chronic Disease Management support, education, and care coordination needs related to HTN, COPD, Anxiety, Depression, and Chronic pain  Clinical Goal(s) related to HTN, COPD, Anxiety, Depression, and Chronic Pain :  Over the next 120 days, patient will:  . Work with the care management team to address educational, disease management, and care coordination needs  . Begin or continue self health monitoring activities as directed today Measure and record blood pressure 2/3 times per week and adhere to a heart healthy diet . Call provider office for new or worsened signs and symptoms Blood pressure findings outside established parameters, Oxygen saturation lower than established parameter, Chest pain,  Shortness of breath, and New or worsened symptom related to COPD, depression, anxiety, and other chronic conditions  . Call care management team with questions or concerns . Verbalize basic understanding of patient centered plan of care established today  Interventions related to HTN, COPD, Anxiety, Depression, and chronic pain :  . Evaluation of current treatment plans and patient's adherence to plan as established by provider.  The patient is setting small goals for herself. She was able to cook supper last night and she is learning to pace her activities. She is trying to not take as much of the morphine and using alternate methods from pain relief when she can.  . Assessed patient understanding of disease states.  Has good understanding of her chronic conditions. Her roommate is on vacation right now so her older sister is with her. She says she has a good support system. She is using patches to help her with smoking cessation. She has only had one cigarette since being out of the hospital. She says the patches are effective. 09-16-2019: Continues to work toward smoking cessation. Gets extremely short of breath with the least amount of activity. States she has her mom there today and that is helpful. She denies any acute distress.  . Assessed patient's education and care coordination needs.  Reviewed available resources in the community. The patient states she has adequate food resources at this time. She will let the [facility] know if there are changes. 09-16-2019: Has misplaced her EBT food card. Ask recommendations on how to get a replacement. Advised the patient to call the number she has on paperwork.  The patient will do this. Offered a care guide referral for food resources but the patient declined. She will let the RNCM know if she changes her mind.  . Provided disease specific education to patient. Education on the benefits of taking blood pressures and recording. She states her blood pressure is up  when she is having pain exacerbations. Also discussed dietary habits and following a heart healthy diet.  Steele Sizer with appropriate clinical care team members regarding patient needs.  The patient works with the pharmacist. Ronnald Nian the LCSW is available if needed. Declines any needs at this time. . Assessed upcoming appointments. Has a follow up with pcp on 09-17-2019 at 1:20 pm and next outreach from [facility] 11-11-2019 at 1 pm   Patient Self Care Activities related to HTN, COPD, Anxiety, Depression, and Chronic pain :  . Patient is unable to independently self-manage chronic health conditions  Please see past updates related to this goal by clicking on the "Past Updates" button in the selected goal          Telephone follow up appointment with care management team member scheduled for: 11-11-2019 at 1 pm  Alto Denver RN, MSN, CCM Community Care Coordinator [facility]  [facility] [place] Mobile: [phone]

## 2019-09-16 NOTE — Patient Instructions (Signed)
Visit Information  Goals Addressed              This Visit's Progress   .  RNCM: Pt-"I am setting goals for myself" (pt-stated)        CARE PLAN ENTRY (see longtitudinal plan of care for additional care plan information)  Current Barriers:  . Chronic Disease Management support, education, and care coordination needs related to HTN, COPD, Anxiety, Depression, and Chronic pain  Clinical Goal(s) related to HTN, COPD, Anxiety, Depression, and Chronic Pain :  Over the next 120 days, patient will:  . Work with the care management team to address educational, disease management, and care coordination needs  . Begin or continue self health monitoring activities as directed today Measure and record blood pressure 2/3 times per week and adhere to a heart healthy diet . Call provider office for new or worsened signs and symptoms Blood pressure findings outside established parameters, Oxygen saturation lower than established parameter, Chest pain, Shortness of breath, and New or worsened symptom related to COPD, depression, anxiety, and other chronic conditions  . Call care management team with questions or concerns . Verbalize basic understanding of patient centered plan of care established today  Interventions related to HTN, COPD, Anxiety, Depression, and chronic pain :  . Evaluation of current treatment plans and patient's adherence to plan as established by provider.  The patient is setting small goals for herself. She was able to cook supper last night and she is learning to pace her activities. She is trying to not take as much of the morphine and using alternate methods from pain relief when she can.  . Assessed patient understanding of disease states.  Has good understanding of her chronic conditions. Her roommate is on vacation right now so her older sister is with her. She says she has a good support system. She is using patches to help her with smoking cessation. She has only had one  cigarette since being out of the hospital. She says the patches are effective. 09-16-2019: Continues to work toward smoking cessation. Gets extremely short of breath with the least amount of activity. States she has her mom there today and that is helpful. She denies any acute distress.  . Assessed patient's education and care coordination needs.  Reviewed available resources in the community. The patient states she has adequate food resources at this time. She will let the [facility] know if there are changes. 09-16-2019: Has misplaced her EBT food card. Ask recommendations on how to get a replacement. Advised the patient to call the number she has on paperwork.  The patient will do this. Offered a care guide referral for food resources but the patient declined. She will let the RNCM know if she changes her mind.  . Provided disease specific education to patient. Education on the benefits of taking blood pressures and recording. She states her blood pressure is up when she is having pain exacerbations. Also discussed dietary habits and following a heart healthy diet.  Steele Sizer with appropriate clinical care team members regarding patient needs.  The patient works with the pharmacist. Ronnald Nian the LCSW is available if needed. Declines any needs at this time. . Assessed upcoming appointments. Has a follow up with pcp on 09-17-2019 at 1:20 pm and next outreach from [facility] 11-11-2019 at 1 pm   Patient Self Care Activities related to HTN, COPD, Anxiety, Depression, and Chronic pain :  . Patient is unable to independently self-manage chronic health conditions  Please see  past updates related to this goal by clicking on the "Past Updates" button in the selected goal         Patient verbalizes understanding of instructions provided today.   Telephone follow up appointment with care management team member scheduled for: 11-11-2019  Alto Denver RN, MSN, CCM Community Care Coordinator [facility]  [facility] [place] Mobile: [phone]

## 2019-09-17 ENCOUNTER — Ambulatory Visit: Payer: Self-pay | Admitting: Licensed Clinical Social Worker

## 2019-09-17 ENCOUNTER — Ambulatory Visit: Payer: Self-pay | Admitting: Family Medicine

## 2019-09-17 NOTE — Chronic Care Management (AMB) (Signed)
Care Management   Follow Up Note   09/17/2019 Name: Alicia Gibson MRN: [id number] DOB: Apr 04, 1960  Referred by: [name], FNP Reason for referral : Care Coordination   Alicia Gibson is a 59 y.o. year old female who is a primary care patient of Anitra Lauth, Jodelle Gross, FNP. The care management team was consulted for assistance with care management and care coordination needs.    Review of patient status, including review of consultants reports, relevant laboratory and other test results, and collaboration with appropriate care team members and the patient's provider was performed as part of comprehensive patient evaluation and provision of chronic care management services.    SDOH (Social Determinants of Health) assessments performed: Yes See Care Plan activities for detailed interventions related to [facility])     Advanced Directives: See Care Plan and Vynca application for related entries.   Goals Addressed      SW- Pt- "I want to improve my quality of life." (pt-stated)        Current Barriers:   Chronic Mental Health needs related to Anxiety, Depression and poor self-care  Financial constraints related to affording medical health expenses   Limited social support  Level of care concerns  ADL IADL limitations  Mental Health Concerns   Social Isolation  Limited access to caregiver  Inability to perform ADL's independently  Inability to perform IADL's independently  Suicidal Ideation/Homicidal Ideation: No  Clinical Social Work Goal(s):   Over the next 120 days, patient/caregiver will work with SW to address concerns related to lack of support/resource connection. LCSW will assist patient in gaining additional support/resource connection and community resource education in order to maintain health and mental health appropriately   Over the next 120 days, patient will demonstrate improved adherence to self care as evidenced by implementing healthy self-care into her  daily routine such as: attending all medical appointments, deep breathing exercises, taking time for self-reflection, taking medications as prescribed, drinking water and daily exercise to improve mobility and mood.   Over the next 120 days, patient will work with SW bi-monthly by telephone or in person to reduce or manage symptoms related to stress and anxiety  Over the next 120 days, patient will demonstrate improved health management independence as evidenced by implementing healthy self-care skills and positive support/resources into her daily routine to help cope with stressors and improve overall health and well-being   Over the next 120 days, patient will verbalize basic understanding of depression/stress process and self health management plan as evidenced by her participation in development of long term plan of care and institution of self health management strategies Interventions:  Patient interviewed and appropriate assessments performed: brief mental health assessment  Patient has ongoing issues with taking care of self independently within the community. Patient is in need of additional support, caregiver assistance and medication adherence. Patient reports that she lives with her caretakers: sister and best friend. She reports that her sister recently relocated from [place] to provide caregiver support to patient. Patient's other sister will come down from [place], [place] and occasionally will stay with patient as well to provide caregiver support.   Provided patient with information about available personal care service resources and day programs within her area: [place] program, [facility], Private Pay Aide, [facility], [facility] and [place].   Discussed plans with patient for ongoing care management follow up and aprovided patient with direct contact information for care management team  Advised patient to consider implementing additional  support/local community resources to improve her overall quality of life.   Assisted patient/caregiver with obtaining information about health plan benefits  Provided education and assistance to client regarding Advanced Directives.  Provided education to patient/caregiver regarding level of care options.  Provided education to patient/caregiver about Hospice and/or Palliative Care services  LCSW provided education on relaxation techniques such as meditation, deep breathing, massage, grounding exercsies or yoga that can activate the body's relaxation response and ease symptoms of anxiety and stress. LCSW ask that when pt is struggling with stress that she start this relaxation response process.   Encouraged patient to consider long term follow up and therapy/counseling for depression and anxiety.   Emotional/Supportive Counseling provided throughout entire session   Patient Self Care Activities:   Motivation for treatment  Strong family or social support  Patient Coping Strengths:   Supportive Relationships  Spirituality  Hopefulness  Self Advocate  Patient Self Care Deficits:   Does not adhere to prescribed medication regimen  Lacks social connections  Initial goal documentation     The care management team will reach out to the patient again over the next 60 days.   Dickie La, BSW, MSW, LCSW [facility]   [facility] [email or web address] Phone: [phone]

## 2019-09-18 ENCOUNTER — Other Ambulatory Visit: Payer: Self-pay

## 2019-09-18 ENCOUNTER — Ambulatory Visit (INDEPENDENT_AMBULATORY_CARE_PROVIDER_SITE_OTHER): Payer: Medicaid Other | Admitting: Family Medicine

## 2019-09-18 ENCOUNTER — Encounter: Payer: Self-pay | Admitting: Family Medicine

## 2019-09-18 VITALS — BP 132/71 | HR 102 | Temp 98.3°F | Resp 22 | Ht 66.0 in | Wt 143.0 lb

## 2019-09-18 DIAGNOSIS — Z515 Encounter for palliative care: Secondary | ICD-10-CM

## 2019-09-18 DIAGNOSIS — J9611 Chronic respiratory failure with hypoxia: Secondary | ICD-10-CM

## 2019-09-18 NOTE — Progress Notes (Signed)
Subjective:    Patient ID: Barbee Shropshire, female    DOB: 1961/01/17, 59 y.o.   MRN: [id number]  Emanuella Nickle is a 59 y.o. female presenting on 09/18/2019 for Pain (pain management contract. The pt sister state that she is having break pain with the pillls. She is currently using the liquid morphine to get through.)   HPI  Ms. Campisi presents to clinic for pain management contract for her MS Contin prescription.  Has acute concerns that intermittently she will need something for her pain around 6-8 hours after taking her MS Contin.  Has been taking the MS Contin every 12 hours as directed and has a small amount of liquid morphine left at home. Depression screen [facility] 2/9 08/19/2019 05/27/2019 11/13/2018  Decreased Interest 1 1 0  Down, Depressed, Hopeless 1 1 0  PHQ - 2 Score 2 2 0  Altered sleeping 1 1 1   Tired, decreased energy 1 1 1   Change in appetite 0 0 0  Feeling bad or failure about yourself  0 0 0  Trouble concentrating 0 0 0  Moving slowly or fidgety/restless 0 1 0  Suicidal thoughts 0 0 0  PHQ-9 Score 4 5 2   Difficult doing work/chores Not difficult at all Not difficult at all Not difficult at all    Social History   Tobacco Use  . Smoking status: Former Smoker    Packs/day: 0.10    Types: Cigarettes  . Smokeless tobacco: Never Used  Vaping Use  . Vaping Use: Never used  Substance Use Topics  . Alcohol use: Never  . Drug use: Never    Review of Systems  Constitutional: Negative.   HENT: Negative.   Eyes: Negative.   Respiratory: Positive for wheezing. Negative for apnea, cough, choking, chest tightness, shortness of breath and stridor.   Cardiovascular: Negative.   Gastrointestinal: Negative.   Endocrine: Negative.   Genitourinary: Negative.   Musculoskeletal: Negative.   Skin: Negative.   Allergic/Immunologic: Negative.   Neurological: Negative.   Hematological: Negative.   Psychiatric/Behavioral: Negative.    Per HPI unless specifically indicated  above     Objective:    BP 132/71 (BP Location: Left Arm, Patient Position: Sitting, Cuff Size: Normal)   Pulse (!) 102   Temp 98.3 F (36.8 C) (Oral)   Resp (!) 22   Ht 5\' 6"  (1.676 m)   Wt 143 lb (64.9 kg)   SpO2 96% Comment: 4L oxygen  BMI 23.08 kg/m   Wt Readings from Last 3 Encounters:  09/18/19 143 lb (64.9 kg)  08/13/19 144 lb 9.6 oz (65.6 kg)  08/04/19 142 lb 4.8 oz (64.5 kg)    Physical Exam Vitals reviewed.  Constitutional:      General: She is not in acute distress.    Appearance: Normal appearance. She is normal weight. She is not toxic-appearing.  HENT:     Head: Normocephalic and atraumatic.     Jaw: No tenderness, swelling or pain on movement.     Salivary Glands: Right salivary gland is not diffusely enlarged. Left salivary gland is not diffusely enlarged.     Comments: Slight facial edema at bases of cheeks    Nose:     Comments: Facemask is in place, covering mouth and nose  Eyes:     General: Lids are normal. Vision grossly intact.        Right eye: No discharge.        Left eye: No discharge.  Extraocular Movements: Extraocular movements intact.     Conjunctiva/sclera: Conjunctivae normal.     Pupils: Pupils are equal, round, and reactive to light.  Neck:     Thyroid: No thyroid mass or thyromegaly.  Cardiovascular:     Rate and Rhythm: Normal rate and regular rhythm.     Pulses: Normal pulses.     Heart sounds: Normal heart sounds. No murmur heard.  No friction rub. No gallop.   Pulmonary:     Effort: Prolonged expiration present. No tachypnea, bradypnea, respiratory distress or retractions.     Breath sounds: No stridor. Examination of the right-lower field reveals decreased breath sounds. Examination of the left-lower field reveals decreased breath sounds. Decreased breath sounds and wheezing present. No rhonchi or rales.     Comments: Faint diffuse expiratory wheezing Musculoskeletal:     Cervical back: Full passive range of motion  without pain and neck supple.     Right lower leg: No edema.     Left lower leg: No edema.  Lymphadenopathy:     Cervical: No cervical adenopathy.  Skin:    General: Skin is warm and dry.     Capillary Refill: Capillary refill takes less than 2 seconds.  Neurological:     General: No focal deficit present.     Mental Status: She is alert and oriented to person, place, and time.     Cranial Nerves: No cranial nerve deficit.     Sensory: No sensory deficit.     Motor: No weakness.     Coordination: Coordination normal.     Gait: Gait normal.  Psychiatric:        Attention and Perception: Attention and perception normal.        Mood and Affect: Mood and affect normal.        Speech: Speech normal.        Behavior: Behavior normal. Behavior is cooperative.        Thought Content: Thought content normal.        Cognition and Memory: Cognition and memory normal.        Judgment: Judgment normal.     Results for orders placed or performed during the hospital encounter of 08/01/19  Blood culture (routine x 2)   Specimen: BLOOD  Result Value Ref Range   Specimen Description BLOOD RIGHT ANTECUBITAL    Special Requests      BOTTLES DRAWN AEROBIC AND ANAEROBIC Blood Culture adequate volume   Culture      NO GROWTH 5 DAYS Performed at [facility], [address] [place], [place] [address]    Report Status 08/06/2019 FINAL   Blood culture (routine x 2)   Specimen: BLOOD  Result Value Ref Range   Specimen Description BLOOD LEFT ANTECUBITAL    Special Requests      BOTTLES DRAWN AEROBIC AND ANAEROBIC Blood Culture adequate volume   Culture      NO GROWTH 5 DAYS Performed at [facility], [address] [place], [place] [address]    Report Status 08/06/2019 FINAL   SARS Coronavirus 2 by RT PCR (hospital order, performed in [facility] hospital lab) Nasopharyngeal Nasopharyngeal Swab   Specimen: Nasopharyngeal Swab  Result Value Ref Range   SARS Coronavirus 2  NEGATIVE NEGATIVE  CBC with Differential  Result Value Ref Range   WBC 9.3 4.0 - 10.5 K/uL   RBC 2.21 (L) 3.87 - 5.11 MIL/uL   Hemoglobin 6.3 (L) 12.0 - 15.0 g/dL   HCT  20.9 (L) 36 - 46 %   MCV 94.6 80.0 - 100.0 fL   MCH 28.5 26.0 - 34.0 pg   MCHC 30.1 30.0 - 36.0 g/dL   RDW 40.9 81.1 - 91.4 %   Platelets 175 150 - 400 K/uL   nRBC 0.0 0.0 - 0.2 %   Neutrophils Relative % 93 %   Neutro Abs 8.6 (H) 1.7 - 7.7 K/uL   Lymphocytes Relative 4 %   Lymphs Abs 0.3 (L) 0.7 - 4.0 K/uL   Monocytes Relative 3 %   Monocytes Absolute 0.3 0 - 1 K/uL   Eosinophils Relative 0 %   Eosinophils Absolute 0.0 0 - 0 K/uL   Basophils Relative 0 %   Basophils Absolute 0.0 0 - 0 K/uL   Immature Granulocytes 0 %   Abs Immature Granulocytes 0.04 0.00 - 0.07 K/uL  Basic metabolic panel  Result Value Ref Range   Sodium 141 135 - 145 mmol/L   Potassium 4.8 3.5 - 5.1 mmol/L   Chloride 92 (L) 98 - 111 mmol/L   CO2 39 (H) 22 - 32 mmol/L   Glucose, Bld 135 (H) 70 - 99 mg/dL   BUN 18 6 - 20 mg/dL   Creatinine, Ser 7.82 0.44 - 1.00 mg/dL   Calcium 8.8 (L) 8.9 - 10.3 mg/dL   GFR calc non Af Amer >60 >60 mL/min   GFR calc Af Amer >60 >60 mL/min   Anion gap 10 5 - 15  Brain natriuretic peptide  Result Value Ref Range   B Natriuretic Peptide 135.6 (H) 0.0 - 100.0 pg/mL  CBC  Result Value Ref Range   WBC 6.8 4.0 - 10.5 K/uL   RBC 1.79 (L) 3.87 - 5.11 MIL/uL   Hemoglobin 5.1 (L) 12.0 - 15.0 g/dL   HCT 95.6 (L) 36 - 46 %   MCV 96.1 80.0 - 100.0 fL   MCH 28.5 26.0 - 34.0 pg   MCHC 29.7 (L) 30.0 - 36.0 g/dL   RDW 21.3 08.6 - 57.8 %   Platelets 142 (L) 150 - 400 K/uL   nRBC 0.0 0.0 - 0.2 %  Lactic acid, plasma  Result Value Ref Range   Lactic Acid, Venous 0.6 0.5 - 1.9 mmol/L  HIV Antibody (routine testing w rflx)  Result Value Ref Range   HIV Screen 4th Generation wRfx Non Reactive Non Reactive  Hemoglobin and hematocrit, blood  Result Value Ref Range   Hemoglobin 12.8 12.0 - 15.0 g/dL   HCT 46.9 36 -  46 %  Blood gas, arterial  Result Value Ref Range   FIO2 0.36    Delivery systems NASAL CANNULA    pH, Arterial 7.51 (H) 7.35 - 7.45   pCO2 arterial 54 (H) 32 - 48 mmHg   pO2, Arterial 53 (L) 83 - 108 mmHg   Bicarbonate 43.1 (H) 20.0 - 28.0 mmol/L   Acid-Base Excess 17.3 (H) 0.0 - 2.0 mmol/L   O2 Saturation 90.3 %   Patient temperature 37.0    Collection site RIGHT BRACHIAL    Sample type ARTERIAL DRAW    Allens test (pass/fail) PASS PASS  Basic metabolic panel  Result Value Ref Range   Sodium 140 135 - 145 mmol/L   Potassium 3.6 3.5 - 5.1 mmol/L   Chloride 95 (L) 98 - 111 mmol/L   CO2 33 (H) 22 - 32 mmol/L   Glucose, Bld 146 (H) 70 - 99 mg/dL   BUN 19 6 - 20 mg/dL  Creatinine, Ser 0.59 0.44 - 1.00 mg/dL   Calcium 9.1 8.9 - 34.1 mg/dL   GFR calc non Af Amer >60 >60 mL/min   GFR calc Af Amer >60 >60 mL/min   Anion gap 12 5 - 15  CBC  Result Value Ref Range   WBC 11.9 (H) 4.0 - 10.5 K/uL   RBC 4.54 3.87 - 5.11 MIL/uL   Hemoglobin 12.4 12.0 - 15.0 g/dL   HCT 96.2 36 - 46 %   MCV 85.5 80.0 - 100.0 fL   MCH 27.3 26.0 - 34.0 pg   MCHC 32.0 30.0 - 36.0 g/dL   RDW 22.9 79.8 - 92.1 %   Platelets 301 150 - 400 K/uL   nRBC 0.0 0.0 - 0.2 %  CBC  Result Value Ref Range   WBC 13.8 (H) 4.0 - 10.5 K/uL   RBC 4.93 3.87 - 5.11 MIL/uL   Hemoglobin 13.5 12.0 - 15.0 g/dL   HCT 19.4 36 - 46 %   MCV 87.8 80.0 - 100.0 fL   MCH 27.4 26.0 - 34.0 pg   MCHC 31.2 30.0 - 36.0 g/dL   RDW 17.4 08.1 - 44.8 %   Platelets 334 150 - 400 K/uL   nRBC 0.0 0.0 - 0.2 %  Basic metabolic panel  Result Value Ref Range   Sodium 139 135 - 145 mmol/L   Potassium 4.6 3.5 - 5.1 mmol/L   Chloride 96 (L) 98 - 111 mmol/L   CO2 32 22 - 32 mmol/L   Glucose, Bld 142 (H) 70 - 99 mg/dL   BUN 26 (H) 6 - 20 mg/dL   Creatinine, Ser 1.85 0.44 - 1.00 mg/dL   Calcium 9.6 8.9 - 63.1 mg/dL   GFR calc non Af Amer >60 >60 mL/min   GFR calc Af Amer >60 >60 mL/min   Anion gap 11 5 - 15  Type and screen  Result Value  Ref Range   ABO/RH(D) A POS    Antibody Screen NEG    Sample Expiration 08/04/2019,2359    Unit Number [id number]    Blood Component Type RED CELLS,LR    Unit division 00    Status of Unit ISSUED,FINAL    Transfusion Status OK TO TRANSFUSE    Crossmatch Result      Compatible Performed at [facility], [address], [place], [place] [address]    Unit Number [id number]    Blood Component Type RED CELLS,LR    Unit division 00    Status of Unit ISSUED,FINAL    Transfusion Status OK TO TRANSFUSE    Crossmatch Result Compatible   Prepare RBC (crossmatch)  Result Value Ref Range   Order Confirmation      ORDER PROCESSED BY BLOOD BANK Performed at [facility], [address], [place], [place] [address]   ABO/Rh  Result Value Ref Range   ABO/RH(D)      A POS Performed at [facility], [address], [place], [place] [address]   BPAM [facility]  Result Value Ref Range   ISSUE DATE / TIME 947654650354    Blood Product Unit Number [id number]    PRODUCT CODE [id number]    Unit Type and Rh 6200    Blood Product Expiration Date 591638466599    ISSUE DATE / TIME [id number]    Blood Product Unit Number [id number]    PRODUCT CODE [id number]    Unit Type and Rh 6200    Blood Product Expiration Date [id number]  Troponin I (High Sensitivity)  Result Value Ref Range   Troponin I (High Sensitivity) 18 (H) <18 ng/L  Troponin I (High Sensitivity)  Result Value Ref Range   Troponin I (High Sensitivity) 17 <18 ng/L      Assessment & Plan:   Problem List Items Addressed This Visit      Respiratory   Chronic respiratory failure (HCC) - Primary    Currently stable and controlled with 3L oxygen via [place].  Has palliative care in place and is here to sign pain management contract in clinic today.  Contract signed.  Patient with concerns that her MS Contin is lasting approx 6-8 hours and needing to take liquid morphine for break through pain.  Discussed  will speak with palliative care and evaluate how to medicate for pain additionally.  Plan: 1. Continue MS Contin 15mg  Q12H 2. Pain contact signed 3. RTC in 3-6 months        Other   Palliative care patient      No orders of the defined types were placed in this encounter.     Follow up plan: Return in about 6 months (around 03/20/2020) for COPD F/U.   03/22/2020, FNP Family Nurse Practitioner [facility] 09/18/2019, 6:57 PM

## 2019-09-20 ENCOUNTER — Encounter: Payer: Self-pay | Admitting: Family Medicine

## 2019-09-20 NOTE — Assessment & Plan Note (Signed)
Currently stable and controlled with 3L oxygen via [place].  Has palliative care in place and is here to sign pain management contract in clinic today.  Contract signed.  Patient with concerns that her MS Contin is lasting approx 6-8 hours and needing to take liquid morphine for break through pain.  Discussed will speak with palliative care and evaluate how to medicate for pain additionally.  Plan: 1. Continue MS Contin 15mg  Q12H 2. Pain contact signed 3. RTC in 3-6 months

## 2019-09-20 NOTE — Patient Instructions (Signed)
Continue medications as prescribed.  Keep upcoming appointment with palliative care nurse practitioner.  We will plan to see you back in 3-6 months for COPD follow up visit  You will receive a survey after today's visit either digitally by e-mail or paper by USPS mail. Your experiences and feedback matter to [name].  Please respond so we know how we are doing as we provide care for you.  Call us with any questions/concerns/needs.  It is my goal to be available to you for your health concerns.  Thanks for choosing me to be a partner in your healthcare needs!  Charlaine Dalton, FNP-C Family Nurse Practitioner [facility] Phone: [phone]

## 2019-09-27 ENCOUNTER — Other Ambulatory Visit: Payer: Self-pay | Admitting: Family Medicine

## 2019-09-27 DIAGNOSIS — M62838 Other muscle spasm: Secondary | ICD-10-CM

## 2019-09-27 DIAGNOSIS — F32A Depression, unspecified: Secondary | ICD-10-CM

## 2019-09-27 MED ORDER — SERTRALINE HCL 100 MG PO TABS
100.0000 mg | ORAL_TABLET | Freq: Every day | ORAL | 0 refills | Status: AC
Start: 2019-09-27 — End: ?

## 2019-09-27 NOTE — Telephone Encounter (Signed)
Medication Refill - Medication: amLODipine (NORVASC) 10 MG tablet cyclobenzaprine (FLEXERIL) 10 MG tablet sertraline (ZOLOFT) 100 MG tablet     Preferred Pharmacy (with phone number or street name): [id number] [place] D[place], [place] - [address] AT [facility] [address] [facility] Phone:  [phone]  Fax:  [phone]       Agent: Please be advised that RX refills may take up to 3 business days. We ask that you follow-up with your pharmacy.

## 2019-09-27 NOTE — Telephone Encounter (Signed)
Requested medication (s) are due for refill today: yes  Requested medication (s) are on the active medication list: yes  Future visit scheduled: yes  Notes to clinic: this refill cannot be delegated  Also bp medication was filled by a different provider    Requested Prescriptions  Pending Prescriptions Disp Refills   amLODipine (NORVASC) 10 MG tablet 30 tablet 0    Sig: Take 1 tablet (10 mg total) by mouth daily.      Cardiovascular:  Calcium Channel Blockers Passed - 09/27/2019 12:09 PM      Passed - Last BP in normal range    BP Readings from Last 1 Encounters:  09/18/19 132/71          Passed - Valid encounter within last 6 months    Recent Outpatient Visits           1 week ago Chronic respiratory failure with hypoxia (HCC)   [facility], Jodelle Gross, FNP   3 weeks ago Chronic bronchitis with COPD (chronic obstructive pulmonary disease) (HCC)   [facility], Jodelle Gross, FNP   1 month ago Pulmonary emphysema, unspecified emphysema type (HCC)   [facility], Jodelle Gross, FNP   1 month ago Palliative care by specialist   [facility], Jodelle Gross, FNP   2 months ago Chronic bronchitis with COPD (chronic obstructive pulmonary disease) (HCC)   [facility], Jodelle Gross, FNP       Future Appointments             In 1 week Cherre Huger, Art Buff, NP [facility] [place]   In 2 weeks Malfi, Jodelle Gross, FNP [facility]              cyclobenzaprine (FLEXERIL) 10 MG tablet 60 tablet 5    Sig: TAKE 1 TABLET BY MOUTH 2 TIMES DAILY AS NEEDED FOR MUSCLE SPASMS      Not Delegated - Analgesics:  Muscle Relaxants Failed - 09/27/2019 12:09 PM      Failed - This refill cannot be delegated      Passed - Valid encounter within last 6 months    Recent Outpatient Visits           1 week ago Chronic respiratory failure with hypoxia [facility])   [facility], Jodelle Gross, FNP   3 weeks ago Chronic bronchitis with COPD (chronic obstructive pulmonary disease) (HCC)   [facility], Jodelle Gross, FNP   1 month ago Pulmonary emphysema, unspecified emphysema type (HCC)   [facility], Jodelle Gross, FNP   1 month ago Palliative care by specialist   [facility], Jodelle Gross, FNP   2 months ago Chronic bronchitis with COPD (chronic obstructive pulmonary disease) (HCC)   [facility], Jodelle Gross, FNP       Future Appointments             In 1 week Cherre Huger Art Buff, NP [place] [place]   In 2 weeks Malfi, Jodelle Gross, FNP [facility]             Signed Prescriptions Disp Refills   sertraline (ZOLOFT) 100 MG tablet 90 tablet 0    Sig: Take 1 tablet (100 mg total) by mouth daily.      Psychiatry:  Antidepressants - SSRI Passed - 09/27/2019  12:09 PM      Passed - Completed PHQ-2 or PHQ-9 in the last 360 days.      Passed - Valid encounter within last 6 months    Recent Outpatient Visits           1 week ago Chronic respiratory failure with hypoxia [facility])   [facility], Jodelle Gross, FNP   3 weeks ago Chronic bronchitis with COPD (chronic obstructive pulmonary disease) (HCC)   [facility], Jodelle Gross, FNP   1 month ago Pulmonary emphysema, unspecified emphysema type [facility])   [facility], Jodelle Gross, FNP   1 month ago Palliative care by specialist   [facility], Jodelle Gross, FNP   2 months ago Chronic bronchitis with COPD (chronic obstructive pulmonary disease) (HCC)   [facility], Jodelle Gross, FNP       Future Appointments             In 1 week Cherre Huger, Art Buff, NP [facility] [place]   In 2 weeks Malfi, Jodelle Gross, FNP [facility]

## 2019-09-30 MED ORDER — CYCLOBENZAPRINE HCL 10 MG PO TABS: ORAL_TABLET | ORAL | 1 refills | Status: AC

## 2019-09-30 MED ORDER — AMLODIPINE BESYLATE 10 MG PO TABS: 5.0000 mg | ORAL_TABLET | Freq: Every day | ORAL | 3 refills | Status: DC

## 2019-10-01 ENCOUNTER — Telehealth: Payer: Self-pay | Admitting: Primary Care

## 2019-10-01 ENCOUNTER — Other Ambulatory Visit: Payer: Self-pay | Admitting: Family Medicine

## 2019-10-01 DIAGNOSIS — F419 Anxiety disorder, unspecified: Secondary | ICD-10-CM

## 2019-10-01 DIAGNOSIS — J301 Allergic rhinitis due to pollen: Secondary | ICD-10-CM

## 2019-10-01 MED ORDER — CETIRIZINE HCL 10 MG PO TABS: 10.0000 mg | ORAL_TABLET | Freq: Every day | ORAL | 5 refills | Status: AC

## 2019-10-01 NOTE — Telephone Encounter (Signed)
Call from family that patient is out of medications and don't know what to do. I advised to call pharmacy and ask for expedited refills.

## 2019-10-01 NOTE — Telephone Encounter (Signed)
Requested medication (s) are due for refill today: no  Requested medication (s) are on the active medication list: no  Last refill:  Discontinued on 05/27/19  Future visit scheduled: yes  Notes to clinic:  Please review for refill. Medication no on current med list.    Requested Prescriptions  Pending Prescriptions Disp Refills   hydrOXYzine (ATARAX/VISTARIL) 10 MG tablet [Pharmacy Med Name: HYDROXYZINE HCL 10MG  TABLETS] 180 tablet 1    Sig: TAKE 1 TO 2 TABLETS(10 TO 20 MG) BY MOUTH THREE TIMES DAILY AS NEEDED FOR ANXIETY OR INSOMNIA      Ear, Nose, and Throat:  Antihistamines Passed - 10/01/2019  4:15 PM      Passed - Valid encounter within last 12 months    Recent Outpatient Visits           1 week ago Chronic respiratory failure with hypoxia [facility])   [facility], FNP   4 weeks ago Chronic bronchitis with COPD (chronic obstructive pulmonary disease) (HCC)   [facility], [facility], FNP   1 month ago Pulmonary emphysema, unspecified emphysema type [facility])   [facility], [facility], FNP   1 month ago Palliative care by specialist   [facility], [facility], FNP   2 months ago Chronic bronchitis with COPD (chronic obstructive pulmonary disease) (HCC)   [facility], [facility], FNP       Future Appointments             In 6 days Jodelle Gross, Cherre Huger, NP [facility] [facility] [place]   In 1 week Malfi, Art Buff, FNP [facility]

## 2019-10-01 NOTE — Telephone Encounter (Signed)
Message sent to PCP MD office to address concerns RE med refills and doses.

## 2019-10-02 ENCOUNTER — Ambulatory Visit: Payer: Self-pay | Admitting: Pharmacist

## 2019-10-02 DIAGNOSIS — I10 Essential (primary) hypertension: Secondary | ICD-10-CM

## 2019-10-02 DIAGNOSIS — J449 Chronic obstructive pulmonary disease, unspecified: Secondary | ICD-10-CM

## 2019-10-02 NOTE — Patient Instructions (Signed)
Thank you allowing the Care Management Team to be a part of your care! It was a pleasure speaking with you today!     Care Management Team    Alto Denver RN, MSN, CCM Nurse Care Coordinator  [phone]   Duanne Moron PharmD  Clinical Pharmacist  [phone]   Dickie La LCSW Clinical Social Worker [phone]   Visit Information  Goals Addressed            This Visit's Progress   . PharmD - Medication Management       CARE PLAN ENTRY (see longitudinal plan of care for additional care plan information)   Current Barriers:  . Chronic Disease Management support, education, and care coordination needs related to COPD on oxygen, HTN, depression/anxiety, HLD and GERD  Pharmacist Clinical Goal(s):  Marland Kitchen Over the next 30 days, patient will work with CM Pharmacist and PCP to address needs related to medication adherence  Interventions: . Received secure message from Palliative Care NP requesting outreach to patient/caregiver as they are having difficulty with obtaining medication refills. . Follow up with patient/caregiver regarding medication refills o Caregiver reports patient currently out of amlodipine, hydroxyzine and cyclobenzaprine and reports has been told by pharmacy that Rxs requiring overrides/PA from insurance . Review [employer] formulary. Confirm all three Rx covered on formulary . Place coordination of care call to [facility]. Speak with Hilton Cork to resolve billing issues. RPh confirms amlodipine and hydroxyzine Rxs now processed through insurance. However, she will need to call health plan for DUR override on cyclobenzaprine Rx today. . Follow up with patient/caregiver to advise that pharmacy is preparing amlodipine and hydroxyzine Rx o Encourage caregiver to call to follow up with [facility] later in the day to confirm cyclobenzaprine also ready for pick up. . Discuss importance of blood pressure control o Note  during last conversation with patient and caregiver on 8/6, [name] reported giving patient amlodipine 5 mg daily - Collaborated with provider at that time and advised caregiver to continue current dose, monitor of BP and follow up with provider regarding readings o Today caregiver reports she started giving patient amlodipine 5 mg - 2 tablets (10 mg total) daily for the past month based on previous medication list o Denies monitoring home BP due to issue with monitor o BP results from recent notes: - 09/18/19: 132/71, HR 102 - 09/09/19: Palliative care NP documents BP as WNL . Send secure message to PCP regarding blood pressure medication management . Counsel on importance of medication adherence.  o [name] reports she continues to fill weekly pillbox and administers patient's medications o Encourage patient to use medication list from latest office visit to aid with filling pillbox o Reports using nebulizer solutions (Perforomist, budesonide and albuterol) as directed o Reports patient taking morphine ER 15 mg every 12 hours as directed.  - Reports patient continues to have some breakthrough pain, but breakthrough has improved. Planning to discuss with PCP further at upcoming appointment . Encourage patient to follow up with providers for new worsening medical concerns  Patient Self Care Activities:  . Calls provider office for new concerns or questions . Unable to self administer medications as prescribed. Sister currently managing medications for patient . Attends medical appointments as scheduled  Please see past updates related to this goal by clicking on the "Past Updates" button in the selected goal         Patient verbalizes understanding of instructions provided today.   Telephone follow  up appointment with care management team member scheduled for: 10/6 at 1 pm  Duanne Moron, PharmD, [facility] Clinical Pharmacist [facility] [employer] [phone]

## 2019-10-02 NOTE — Chronic Care Management (AMB) (Signed)
Care Management   Follow Up Note   10/02/2019 Name: Alicia Gibson MRN: [id number] DOB: 1960-06-29  Referred by: Alicia Fuller, FNP Reason for referral : Chronic Care Management (Patient/Caregiver Phone Call) and Care Coordination [facility])   Alicia Gibson is a 59 y.o. year old female who is a primary care patient of Alicia Fuller, FNP. The care management team was consulted for assistance with care management and care coordination needs.    Outreach to [employer] and Facilities manager by phone today.  Place coordination of care call to [facility].  Review of patient status, including review of consultants reports, relevant laboratory and other test results, and collaboration with appropriate care team members and the patient's provider was performed as part of comprehensive patient evaluation and provision of chronic care management services.    SDOH (Social Determinants of Health) assessments performed: No See Care Plan activities for detailed interventions related to [facility])     Advanced Directives: See Care Plan and Vynca application for related entries.   Goals Addressed            This Visit's Progress    PharmD - Medication Management       CARE PLAN ENTRY (see longitudinal plan of care for additional care plan information)   Current Barriers:   Chronic Disease Management support, education, and care coordination needs related to COPD on oxygen, HTN, depression/anxiety, HLD and GERD  Pharmacist Clinical Goal(s):   Over the next 30 days, patient will work with CM Pharmacist and PCP to address needs related to medication adherence  Interventions:  Received secure message from Palliative Care NP requesting outreach to patient/caregiver as they are having difficulty with obtaining medication refills.  Follow up with patient/caregiver regarding medication refills o Caregiver reports patient currently out of amlodipine, hydroxyzine and  cyclobenzaprine and reports has been told by pharmacy that Rxs requiring overrides/PA from insurance  Review [employer] formulary. Confirm all three Rx covered on formulary  Place coordination of care call to [facility]. Speak with Alicia Gibson to resolve billing issues. RPh confirms amlodipine and hydroxyzine Rxs now processed through insurance. However, she will need to call health plan for DUR override on cyclobenzaprine Rx today.  Follow up with patient/caregiver to advise that pharmacy is preparing amlodipine and hydroxyzine Rx o Encourage caregiver to call to follow up with [facility] later in the day to confirm cyclobenzaprine also ready for pick up.  Discuss importance of blood pressure control o Note during last conversation with patient and caregiver on 8/6, Alicia Gibson reported giving patient amlodipine 5 mg daily - Collaborated with provider at that time and advised caregiver to continue current dose, monitor of BP and follow up with provider regarding readings o Today caregiver reports she started giving patient amlodipine 5 mg - 2 tablets (10 mg total) daily for the past month based on previous medication list o Denies monitoring home BP due to issue with monitor o BP results from recent notes: - 09/18/19: 132/71, HR 102 - 09/09/19: Palliative care NP documents BP as WNL  Send secure message to PCP regarding blood pressure medication management  Counsel on importance of medication adherence.  o Alicia Gibson reports she continues to fill weekly pillbox and administers patient's medications o Encourage patient to use medication list from latest office visit to aid with filling pillbox o Reports using nebulizer solutions (Perforomist, budesonide and albuterol) as directed o Reports patient taking morphine ER 15 mg every 12 hours as directed.  -  Reports patient continues to have some breakthrough pain, but breakthrough has improved. Planning to discuss with  PCP further at upcoming appointment  Encourage patient to follow up with providers for new worsening medical concerns  Patient Self Care Activities:   Calls provider office for new concerns or questions  Unable to self administer medications as prescribed. Sister currently managing medications for patient  Attends medical appointments as scheduled  Please see past updates related to this goal by clicking on the "Past Updates" button in the selected goal         Plan  Telephone follow up appointment with care management team member scheduled for: 10/6 at 1 pm  Alicia Gibson, PharmD, [facility] Clinical Pharmacist [facility] [phone]

## 2019-10-03 ENCOUNTER — Other Ambulatory Visit: Payer: Self-pay | Admitting: Family Medicine

## 2019-10-03 DIAGNOSIS — I10 Essential (primary) hypertension: Secondary | ICD-10-CM

## 2019-10-03 MED ORDER — AMLODIPINE BESYLATE 10 MG PO TABS: 10.0000 mg | ORAL_TABLET | Freq: Every day | ORAL | 1 refills | Status: DC

## 2019-10-04 ENCOUNTER — Ambulatory Visit: Payer: Self-pay | Admitting: Pharmacist

## 2019-10-04 DIAGNOSIS — I10 Essential (primary) hypertension: Secondary | ICD-10-CM

## 2019-10-04 NOTE — Chronic Care Management (AMB) (Signed)
  Care Management   Follow Up Note   10/04/2019 Name: Alicia Gibson MRN: [id number] DOB: 03-20-1960  Referred by: Alicia Fuller, FNP Reason for referral : Chronic Care Management (Patient Phone Call)   Alicia Gibson is a 59 y.o. year old female who is a primary care patient of Alicia Fuller, FNP. The care management team was consulted for assistance with care management and care coordination needs.    Outreach to [employer] and Facilities manager by phone today.  Review of patient status, including review of consultants reports, relevant laboratory and other test results, and collaboration with appropriate care team members and the patient's provider was performed as part of comprehensive patient evaluation and provision of chronic care management services.    SDOH (Social Determinants of Health) assessments performed: No See Care Plan activities for detailed interventions related to [facility])     Advanced Directives: See Care Plan and Vynca application for related entries.   Goals Addressed            This Visit's Progress   . PharmD - Medication Management       CARE PLAN ENTRY (see longitudinal plan of care for additional care plan information)   Current Barriers:  . Chronic Disease Management support, education, and care coordination needs related to COPD on oxygen, HTN, depression/anxiety, HLD and GERD  Pharmacist Clinical Goal(s):  Marland Kitchen Over the next 30 days, patient will work with CM Pharmacist and PCP to address needs related to medication adherence  Interventions: . Collaborate with PCP regarding blood pressure medication management. Provider okay with patient continuing to take amlodipine 10 mg daily as sister reports has been taking at home. Updated Rx sent to pharmacy. . Follow up with patient and sister Alicia Gibson. Alicia Gibson confirms understanding of plan for patient to continue amlodipine 10 mg daily o Discuss importance of home blood pressure monitoring. Alicia Gibson  states has tried putting new battery in monitor, but still not functioning properly o Encourage caregiver/patient to obtain new upper arm BP monitor to check at home and follow up with office for readings outside established parameters/new symptoms o Caregiver confirms was able to pick up Rx for amlodipine, cyclobenzaprine and hydroxyzine. o Denies further medication questions/concerns today. . Encourage patient to follow up with providers for new worsening medical concerns . Will collaborate with Palliative Care NP  Patient Self Care Activities:  . Calls provider office for new concerns or questions . Unable to self administer medications as prescribed. Sister currently managing medications for patient . Attends medical appointments as scheduled  Please see past updates related to this goal by clicking on the "Past Updates" button in the selected goal         Plan  Telephone follow up appointment with care management team member scheduled for: 10/6 at 1 pm  Duanne Moron, PharmD, [facility] Clinical Pharmacist [facility] [phone]

## 2019-10-04 NOTE — Patient Instructions (Signed)
Thank you allowing the Care Management Team to be a part of your care! It was a pleasure speaking with you today!     Care Management Team    Alto Denver RN, MSN, CCM Nurse Care Coordinator  [phone]   Duanne Moron PharmD  Clinical Pharmacist  [phone]   Dickie La LCSW Clinical Social Worker [phone]   Visit Information  Goals Addressed            This Visit's Progress   . PharmD - Medication Management       CARE PLAN ENTRY (see longitudinal plan of care for additional care plan information)   Current Barriers:  . Chronic Disease Management support, education, and care coordination needs related to COPD on oxygen, HTN, depression/anxiety, HLD and GERD  Pharmacist Clinical Goal(s):  Marland Kitchen Over the next 30 days, patient will work with CM Pharmacist and PCP to address needs related to medication adherence  Interventions: . Collaborate with PCP regarding blood pressure medication management. Provider okay with patient continuing to take amlodipine 10 mg daily as sister reports has been taking at home. Updated Rx sent to pharmacy. . Follow up with patient and sister [name]. [name] confirms understanding of plan for patient to continue amlodipine 10 mg daily o Discuss importance of home blood pressure monitoring. [name] states has tried putting new battery in monitor, but still not functioning properly o Encourage caregiver/patient to obtain new upper arm BP monitor to check at home and follow up with office for readings outside established parameters/new symptoms o Caregiver confirms was able to pick up Rx for amlodipine, cyclobenzaprine and hydroxyzine. o Denies further medication questions/concerns today. . Encourage patient to follow up with providers for new worsening medical concerns . Will collaborate with Palliative Care NP  Patient Self Care Activities:  . Calls provider office for new concerns or questions . Unable to self administer  medications as prescribed. Sister currently managing medications for patient . Attends medical appointments as scheduled  Please see past updates related to this goal by clicking on the "Past Updates" button in the selected goal         Patient verbalizes understanding of instructions provided today.   Telephone follow up appointment with care management team member scheduled for: 10/6 at 1 pm  Duanne Moron, PharmD, [facility] Clinical Pharmacist [facility] [employer] [phone]

## 2019-10-07 ENCOUNTER — Telehealth: Payer: Self-pay | Admitting: Pulmonary Disease

## 2019-10-07 ENCOUNTER — Other Ambulatory Visit: Payer: Self-pay

## 2019-10-07 ENCOUNTER — Other Ambulatory Visit: Payer: Self-pay | Admitting: Family Medicine

## 2019-10-07 ENCOUNTER — Telehealth: Payer: Self-pay | Admitting: Family Medicine

## 2019-10-07 ENCOUNTER — Ambulatory Visit (INDEPENDENT_AMBULATORY_CARE_PROVIDER_SITE_OTHER): Payer: Disability Insurance | Admitting: Pulmonary Disease

## 2019-10-07 ENCOUNTER — Encounter: Payer: Self-pay | Admitting: Pulmonary Disease

## 2019-10-07 DIAGNOSIS — Z7189 Other specified counseling: Secondary | ICD-10-CM | POA: Insufficient documentation

## 2019-10-07 DIAGNOSIS — J439 Emphysema, unspecified: Secondary | ICD-10-CM

## 2019-10-07 DIAGNOSIS — Z515 Encounter for palliative care: Secondary | ICD-10-CM

## 2019-10-07 DIAGNOSIS — R0602 Shortness of breath: Secondary | ICD-10-CM

## 2019-10-07 DIAGNOSIS — J9611 Chronic respiratory failure with hypoxia: Secondary | ICD-10-CM

## 2019-10-07 MED ORDER — MORPHINE SULFATE ER 15 MG PO TBCR: 15.0000 mg | EXTENDED_RELEASE_TABLET | Freq: Two times a day (BID) | ORAL | 0 refills | Status: DC | PRN

## 2019-10-07 NOTE — Assessment & Plan Note (Signed)
Patient reporting today that she would like to be a DNR She is currently in the process working with palliative care to get forms notarized

## 2019-10-07 NOTE — Assessment & Plan Note (Signed)
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

## 2019-10-07 NOTE — Progress Notes (Signed)
Personally [place] today, 10/07/19.  According to PMP aware, pt last received a refill on 09/10/2019.  Refill of her controlled substance will be provided today.

## 2019-10-07 NOTE — Progress Notes (Signed)
Virtual Visit via Telephone Note  I connected with Alicia Gibson on 10/07/19 at  2:30 PM EDT by telephone and verified that I am speaking with the correct person using two identifiers.  Location: Patient: Home Provider: Office [facility] - [address] [place], [address], [place], [place] [address]   I discussed the limitations, risks, security and privacy concerns of performing an evaluation and management service by telephone and the availability of in person appointments. I also discussed with the patient that there may be a patient responsible charge related to this service. The patient expressed understanding and agreed to proceed.  Patient consented to consult via telephone: Yes People present and their role in pt care: Pt     History of Present Illness:  59 year old female former smoker followed in our office for Severe COPD IV  Past medical history: History of pneumothorax, pulmonary emphysema, history of pneumonia, hypertension, anxiety, depression, Smoking history: Former smoker Maintenance: Pulmicort, performist Patient of Dr. Jayme Cloud  Chief complaint:    59 year old female former smoker followed in our office for COPD.  She is followed by Dr. Jayme Cloud.  She was last seen in June/2021 by Dr. Jayme Cloud.  At that office visit patient presented ashen in color with oxygen saturations of 66%.  She was brought into the emergency room which showed severe hyperinflation on chest x-ray, loculated fluid in the fissure of the left pseudotumor and small left pleural effusion.  Messages in August/2021 showed that primary care is working with the patient and she is now established with palliative care.  Patient's visit today was changed to a telephonic visit due to worsened respiratory symptoms today.  Patient was reached by telephone and she reports that she is currently taking her Pulmicort and her performance as prescribed.  She is starting prednisone 10 mg daily.  She has to use her  rescue inhaler 1 time a day.  She is actively working with palliative care.  She is currently in the process working with palliative care to make sure her appropriate documentations are in place for her to be a DNR.  She does not want to be hospitalized again.  She is also talking with palliative care about potentially starting hospice.  She is working with primary care for management of chronic pain.  Last pulmonary function testing December/2019 showed COPD stage IV  Observations/Objective:  Social History   Tobacco Use  Smoking Status Former Smoker  . Packs/day: 0.10  . Types: Cigarettes  Smokeless Tobacco Never Used   Immunization History  Administered Date(s) Administered  . 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 Established with palliative  Plan: Continue daily prednisone at 10 mg Continue Perforomist nebulized meds twice daily Continue budesonide nebulized meds twice daily Encourage patient to discuss with palliative care if she can transition to hospice as I believe that would be more suitable given her needs as well as her shifting her focus on her quality life as well as not wanting to be admitted to the hospital   Chronic respiratory failure (HCC) Plan: Continue oxygen therapy Continue work with palliative care Discussed with palliative care whether not he should transition to hospice  Palliative care by specialist Plan: Continue follow-up with palliative care  DNR (do not resuscitate) discussion Patient reporting today that she would like to be a DNR She is currently in the process working with palliative care to get forms notarized  Follow Up Instructions:  Return in about 3 months (around 01/06/2020), or if symptoms worsen or fail to improve, for [facility] - [name], [facility] - Dr. Jayme Cloud.   I discussed the  assessment and treatment plan with the patient. The patient was provided an opportunity to ask questions and all were answered. The patient agreed with the plan and demonstrated an understanding of the instructions.   The patient was advised to call back or seek an in-person evaluation if the symptoms worsen or if the condition fails to improve as anticipated.  I provided 22 minutes of non-face-to-face time during this encounter.   Coral Ceo, NP

## 2019-10-07 NOTE — Assessment & Plan Note (Signed)
Plan: Continue oxygen therapy Continue work with palliative care Discussed with palliative care whether not he should transition to hospice

## 2019-10-07 NOTE — Patient Instructions (Addendum)
You were seen today by Coral Ceo, NP  for:   1. Pulmonary emphysema, unspecified emphysema type (HCC)  Continue prednisone 10 mg daily  Continue budesonide nebulized meds twice daily Continue performist nebulized meds twice daily  Continue to work with palliative care  Discussed with palliative care whether or not hospice would be more suitable option for you  2. Chronic respiratory failure with hypoxia (HCC)  Continue oxygen therapy as prescribed  >>>maintain oxygen saturations greater than 88 percent  >>>if unable to maintain oxygen saturations please contact the office  >>>do not smoke with oxygen  >>>can use nasal saline gel or nasal saline rinses to moisturize nose if oxygen causes dryness  3. Palliative care by specialist 4. DNR (do not resuscitate) discussion  Complete forms as planned to become a DNR  Consider switching to hospice, can discuss this with primary care as well as with palliative care team    Follow Up:    Return in about 3 months (around 01/06/2020), or if symptoms worsen or fail to improve, for [facility] - [name], [facility] - Dr. Jayme Cloud.   Notification of test results are managed in the following manner: If there are  any recommendations or changes to the  plan of care discussed in office today,  we will contact you and let you know what they are. If you do not hear from [name], then your results are normal and you can view them through your  MyChart account , or a letter will be sent to you. Thank you again for trusting [name] with your care  - Thank you, [facility]    It is flu season:   >>> Best ways to protect herself from the flu: Receive the yearly flu vaccine, practice good hand hygiene washing with soap and also using hand sanitizer when available, eat a nutritious meals, get adequate rest, hydrate appropriately       Please contact the office if your symptoms worsen or you have concerns that you are not improving.    Thank you for choosing [facility] for your healthcare, and for allowing [name] to partner with you on your healthcare journey. I am thankful to be able to provide care to you today.   Elisha Headland FNP-C

## 2019-10-07 NOTE — Telephone Encounter (Signed)
Medication Refill - Medication: Ms Contin 15mg    Has the patient contacted their pharmacy? No. (Agent: If no, request that the patient contact the pharmacy for the refill.) (Agent: If yes, when and what did the pharmacy advise?)  Preferred Pharmacy (with phone number or street name): [id number] - [place], [place] - [address] AT [facility] OF [address]  Agent: Please be advised that RX refills may take up to 3 business days. We ask that you follow-up with your pharmacy.

## 2019-10-07 NOTE — Telephone Encounter (Signed)
This has been followed up on already, she had a televisit already today.  Nothing further needed.

## 2019-10-07 NOTE — Assessment & Plan Note (Signed)
Plan: Continue follow-up with palliative care

## 2019-10-08 ENCOUNTER — Telehealth: Payer: Self-pay

## 2019-10-08 ENCOUNTER — Other Ambulatory Visit: Payer: Medicaid Other | Admitting: Primary Care

## 2019-10-08 NOTE — Telephone Encounter (Signed)
Copied from [place] [id number]. Topic: Quick Communication - See Telephone Encounter >> Oct 07, 2019  2:47 PM Loma Boston wrote: CRM for notification. See Telephone encounter for: 10/07/19. Dr Suzanne Boron, the lung dr had a phone conversation  with pt and he is sending to [place] today. Make sure this message gets to [place].   I called and spoke with the patient. She verbalize she didn't have any concerns. I informed her that Elmyra Ricks could review the Pulmonologist notes. I also notified the patient that her controlled pain medication was sent over to her pharmacy, but she could not pick the prescription up earlier. She verbalize understanding, no questions or concerns.

## 2019-10-08 NOTE — Telephone Encounter (Signed)
Who is Synetta Fail?

## 2019-10-09 ENCOUNTER — Other Ambulatory Visit: Payer: Medicaid Other | Admitting: Primary Care

## 2019-10-09 ENCOUNTER — Other Ambulatory Visit: Payer: Self-pay

## 2019-10-09 DIAGNOSIS — J441 Chronic obstructive pulmonary disease with (acute) exacerbation: Secondary | ICD-10-CM

## 2019-10-09 DIAGNOSIS — J449 Chronic obstructive pulmonary disease, unspecified: Secondary | ICD-10-CM

## 2019-10-09 DIAGNOSIS — Z515 Encounter for palliative care: Secondary | ICD-10-CM

## 2019-10-09 DIAGNOSIS — J4489 Other specified chronic obstructive pulmonary disease: Secondary | ICD-10-CM

## 2019-10-09 NOTE — Progress Notes (Signed)
Campus Consult Note Telephone: [phone]  Fax: [phone]  PATIENT NAME: Alicia Gibson [address] [place] [address] [phone] (home)  DOB: 1960/03/12 MRN: [id number]  PRIMARY CARE PROVIDER:    Verl Bangs, [place],  [place] [address] [phone]  REFERRING PROVIDER:   Verl Bangs, [place],  [place] [address] [phone]  RESPONSIBLE PARTY:   Extended Emergency Contact Information Primary Emergency Contact: CARNES,ROBIN Address: [address]          [place], [place] [address] Mobile Phone: [phone] Relation: Friend Secondary Emergency Contact: Morton, Walker Phone: [phone] Mobile Phone: [phone] Relation: Sister  I met face to face with patient and family in home.  ASSESSMENT AND RECOMMENDATIONS:   1. Advance Care Planning/Goals of Care: Goals include to maximize quality of life and symptom management. Our advance care planning conversation included a discussion about:     Exploration of goals of care in the event of a sudden injury or illness   Review  of an  advance directive document .  She recently had a f/u with Wyn Quaker NP  at [facility]. He felt she was hospice appropriate. We discussed her goals of care today RE her extreme SOB and DOE, and desire to not have hospitalizations. She has oxygen and nebulizer treatments.   Has completed MOST and DNR, which are in home.Her instructions are DNR, comfort interventions, determine use of abx and iv, no feeding tube. She has a goldenrod DNR as well.   2. Symptom Management:   Pain and dyspnea management: Endorses pain in thoracic region, anterior and posterior.Currently on MS contin 15 mg bid but I recommend it increase to 30 mg BID. I recommended morphine IR 7.5 mg q 6 hrs prn pain/dyspnea to PCP.  She is electing hospice hopefully for admission this week so may want to defer to  that provider.   Dyspnea has increased to very debilitating at rest. She cannot ambulate more then 20-30 feet without needing to rest to recover. She often tripods for breathing. She endorses her goals to be comfort measures, staying at home with her family and cats, and hopefully living long enough to have her birthday and see the new year.  She has asked about hospice admission. This is consistent with her goals of care. I will put in a referral for hospice to center this pm.  She outlines not wanting to return to hospital for aggressive treatments. Meds reviewed, most are comfort related. She is doing nebulizers in lieu of hfa for the most part.   3. Follow up Palliative Care Visit: Refer to hospice, follow if needed and not admitted for home hospice management.  4. Family /Caregiver/Community Supports: Lives with sister and roommate  5. Cognitive / Functional decline: A and O x 3, Able to make decisions. Able to feed self, light grooming. Needs assist with iadls, many adls. Very DOE which limits function.  I spent 60 minutes providing this consultation,  from 1420 to 1520. More than 50% of the time in this consultation was spent coordinating communication.   CHIEF COMPLAINT: Profound Dyspnea, uncontrolled pain.  HISTORY OF PRESENT ILLNESS:  Alicia Gibson is a 59 y.o. year old female with multiple medical problems including COPD, oxygen dependence, nicotine dependence, depression, anxiety . Palliative Care was asked to follow this patient by consultation request of Malfi, Lupita Raider, FNP to help address advance care planning and goals  of care. This is a follow up visit.  CODE STATUS: DNR  PPS: 40%  HOSPICE ELIGIBILITY/DIAGNOSIS:yes/copd  PAST MEDICAL HISTORY:  Past Medical History:  Diagnosis Date   Allergy    Anxiety    Colitis    recurrent   COPD (chronic obstructive pulmonary disease) ([place])    Depression    Graves disease    s/p iodine radiation treatment - euthyroid for  many years per pt   Hyperlipidemia    Hypertension    Loss of eye, LEFT 2007    SOCIAL HX:  Social History   Tobacco Use   Smoking status: Former Smoker    Packs/day: 0.10    Types: Cigarettes   Smokeless tobacco: Never Used  Substance Use Topics   Alcohol use: Never   FAMILY HX:  Family History  Problem Relation Age of Onset   Heart disease Father    Alcohol abuse Father    Alcohol abuse Brother    Heart failure Sister    Celiac disease Sister    Healthy Sister     ALLERGIES:  Allergies  Allergen Reactions   Contrast Media [Iodinated Diagnostic Agents] Anaphylaxis   Iodine Anaphylaxis   Codeine Hives and Nausea And Vomiting   Shellfish Allergy Hives   Penicillins Hives and Rash     PERTINENT MEDICATIONS:  Outpatient Encounter Medications as of 10/09/2019  Medication Sig   albuterol (PROVENTIL) (2.5 MG/3ML) 0.083% nebulizer solution Take 3 mLs (2.5 mg total) by nebulization 4 (four) times daily.   albuterol (VENTOLIN HFA) 108 (90 Base) MCG/ACT inhaler INHALE 2 PUFFS INTO THE LUNGS EVERY 6 HOURS AS NEEDED FOR WHEEZING OR SHORTNESS OF BREATH   amLODipine (NORVASC) 10 MG tablet Take 1 tablet (10 mg total) by mouth daily.   aspirin EC 81 MG tablet Take 81 mg by mouth daily.   atorvastatin (LIPITOR) 40 MG tablet TAKE 1 TABLET(40 MG) BY MOUTH DAILY   budesonide (PULMICORT) 0.5 MG/2ML nebulizer solution Take 2 mLs (0.5 mg total) by nebulization 2 (two) times daily.   cetirizine (ZYRTEC) 10 MG tablet Take 1 tablet (10 mg total) by mouth daily.   Cholecalciferol 50 MCG (2000 UT) TABS Take 1 tablet by mouth daily.   cyclobenzaprine (FLEXERIL) 10 MG tablet TAKE 1 TABLET BY MOUTH 2 TIMES DAILY AS NEEDED FOR MUSCLE SPASMS   formoterol (PERFOROMIST) 20 MCG/2ML nebulizer solution Take 2 mLs (20 mcg total) by nebulization 2 (two) times daily.   guaiFENesin (MUCINEX) 600 MG 12 hr tablet Take 2 tablets (1,200 mg total) by mouth 2 (two) times daily as needed  for cough or to loosen phlegm.   hydrOXYzine (ATARAX/VISTARIL) 10 MG tablet TAKE 1 TO 2 TABLETS(10 TO 20 MG) BY MOUTH THREE TIMES DAILY AS NEEDED FOR ANXIETY OR INSOMNIA   morphine (MS CONTIN) 15 MG 12 hr tablet Take 1 tablet (15 mg total) by mouth every 12 (twelve) hours as needed for pain.   Multiple Vitamin (MULTIVITAMIN WITH MINERALS) TABS tablet Take 1 tablet by mouth daily.   nicotine (NICODERM CQ) 14 mg/24hr patch Place 1 patch (14 mg total) onto the skin daily.   omeprazole (PRILOSEC) 20 MG capsule Take by mouth daily.   predniSONE (DELTASONE) 10 MG tablet Take 1 tablet (10 mg total) by mouth daily with breakfast.   sertraline (ZOLOFT) 100 MG tablet Take 1 tablet (100 mg total) by mouth daily.   No facility-administered encounter medications on file as of 10/09/2019.    PHYSICAL EXAM / ROS:   RR  26, HR 84, BP 126/84  Current and past weights: 135 lbs, 5 lb wt loss General: frail appearing, thin Cardiovascular: S1,S2, bounding, no chest pain reported, no LE  edema  Pulmonary: Moving air poorly, doing nebs q4h, + dry  cough, increased SOB, DOE in home, oxygen at 95% on 3.5 l oxygen, provided by  [facility] Abdomen: appetite fair, endorses  occ constipation, continent of bowel GU: denies dysuria, continent of urine MSK:  + joint and ROM abnormalities, ambulatory in home, denies falls Skin: no rashes or wounds reported Neurological: Weakness, pain is 8/10 in ribs/ thoracic cage.   Jason Coop, NP , DNP, MPH, [facility]   COVID-19 PATIENT SCREENING TOOL  Person answering questions: ____________self______ _____   1.  Is the patient or any family member in the home showing any signs or symptoms regarding respiratory infection?               Person with Symptom- __________NA_________________  a. Fever                                                                          Yes___ No___          ___________________  b. Shortness of breath                                                     Yes___ No___          ___________________ c. Cough/congestion                                       Yes___  No___         ___________________ d. Body aches/pains                                                         Yes___ No___        ____________________ e. Gastrointestinal symptoms (diarrhea, nausea)           Yes___ No___        ____________________  2. Within the past 14 days, has anyone living in the home had any contact with someone with or under investigation for COVID-19?    Yes___ No_X_   Person __________________

## 2019-10-11 NOTE — Progress Notes (Signed)
Agree with the details of the phone visit as noted below by Elisha Headland, NP.  Patient previously followed by Dr. Santiago Glad however, I have assumed care as [name] has transitioned to critical care.  She has end-stage COPD and would benefit from hospice.  Agree with end-of-life discussions as noted.  Gailen Shelter, MD [facility] [facility]

## 2019-10-14 ENCOUNTER — Telehealth: Payer: Self-pay | Admitting: Family Medicine

## 2019-10-14 ENCOUNTER — Telehealth (INDEPENDENT_AMBULATORY_CARE_PROVIDER_SITE_OTHER): Payer: Medicaid Other | Admitting: Family Medicine

## 2019-10-14 ENCOUNTER — Other Ambulatory Visit: Payer: Self-pay

## 2019-10-14 ENCOUNTER — Encounter: Payer: Self-pay | Admitting: Family Medicine

## 2019-10-14 DIAGNOSIS — J9611 Chronic respiratory failure with hypoxia: Secondary | ICD-10-CM | POA: Diagnosis not present

## 2019-10-14 NOTE — Telephone Encounter (Signed)
Call to [facility] and discussed patient's concerns with entering hospice.  FNP notified patient was to meet with hospice today, but appointment was cancelled by patient's sister bc of patient not feeling well and would need to be rescheduled to Wednesday.    CMA contacted patient and sister (see documentation).  Discussed case with Fayette Pho and notified FNP would be putting in an APS report due to not allowing patient to obtain medical care.

## 2019-10-14 NOTE — Assessment & Plan Note (Signed)
Patient has been admitted to hospice on 10/12/2019.  Is now unsure if she would like to stay on hospice or return to palliative care.  Case discussed with Fayette Pho and she will be at Ms. Julson's home on Wednesday for re-evaluation and discussion of services.    Plan: 1. To review after next hospice home evaluation on 10/16/2019.

## 2019-10-14 NOTE — Telephone Encounter (Signed)
Pt sister cheryl is calling and her sister annel would like to put off the hospice visit until 10-16-2019. Pt is waiting for nicole to get back with them with the answers to the questions from  Today. Pt has anxiety issues with accept hospice

## 2019-10-14 NOTE — Telephone Encounter (Signed)
I called to speak with the patient and her sister [name] answered the phone. I informed her that Joni Reining reached out to hospice and spoke with Judeth Cornfield, RN who is scheduled to come out today. The sister said that we need to reschedule the appointment because Simara is having a bad day. I informed her that Joni Reining recommended that they come out especially since she is having a bad day, because they can help her get through it. She said no  Its not going to happen. I asked her if I could speak with the patient. She then went in the room with [facility]. I heard [name] explain what I was requesting and then proceed to tell Niani to reschedule the appt until Wednesday. I informed Loralee that Joni Reining felt like it would be a good idea for them to come out today to because she was having a bad day. Nishika said well it's okay for them to come. [name] spoke up and said if you keep bullying her I will stop her from seeing you. Then she hung up the phone.

## 2019-10-14 NOTE — Progress Notes (Signed)
Virtual Visit via Telephone  The purpose of this virtual visit is to provide medical care while limiting exposure to the novel coronavirus (COVID19) for both patient and office staff.  Consent was obtained for phone visit:  Yes.   Answered questions that patient had about telehealth interaction:  Yes.   I discussed the limitations, risks, security and privacy concerns of performing an evaluation and management service by telephone. I also discussed with the patient that there may be a patient responsible charge related to this service. The patient expressed understanding and agreed to proceed.  Patient is at home and is accessed via telephone Services are provided by Charlaine Dalton, FNP-C from [facility])  ---------------------------------------------------------------------- Chief Complaint  Patient presents with  . COPD    S: Reviewed CMA documentation. I have called patient and gathered additional HPI as follows:  Alicia Gibson presents for telemedicine visit today with her sister, Alicia Gibson.  Ms. Dodge reports that she was admitted to hospice on Saturday, but she was unhappy with the intake person from Saturday and would like to return to palliative care.  She has concerns that hospice will stop her atorvastatin, change her inhalers and she wouldn't be able to get her nicotine.  Let patient and sister know I would contact [facility] to follow up with them.  Has been having a pulse ox of 91% with nasal oxygen when at rest and when ambulating her pulse ox goes to the low 70's and after about 20 feet needs to stop to recover.   Patient is currently home Denies any high risk travel to areas of current concern for COVID19. Denies any known or suspected exposure to person with or possibly with COVID19.  Past Medical History:  Diagnosis Date  . Allergy   . Anxiety   . Colitis    recurrent  . COPD (chronic obstructive pulmonary disease) (HCC)   . Depression   .  Graves disease    s/p iodine radiation treatment - euthyroid for many years per pt  . Hyperlipidemia   . Hypertension   . Loss of eye, LEFT 2007   Social History   Tobacco Use  . Smoking status: Former Smoker    Packs/day: 0.10    Types: Cigarettes  . Smokeless tobacco: Never Used  Vaping Use  . Vaping Use: Never used  Substance Use Topics  . Alcohol use: Never  . Drug use: Never    Current Outpatient Medications:  .  acetaminophen (TYLENOL) 325 MG tablet, Take 650 mg by mouth every 6 (six) hours as needed., Disp: , Rfl:  .  albuterol (PROVENTIL) (2.5 MG/3ML) 0.083% nebulizer solution, Take 3 mLs (2.5 mg total) by nebulization 4 (four) times daily., Disp: 75 mL, Rfl: 3 .  albuterol (VENTOLIN HFA) 108 (90 Base) MCG/ACT inhaler, INHALE 2 PUFFS INTO THE LUNGS EVERY 6 HOURS AS NEEDED FOR WHEEZING OR SHORTNESS OF BREATH, Disp: 25.5 g, Rfl: 1 .  amLODipine (NORVASC) 10 MG tablet, Take 1 tablet (10 mg total) by mouth daily., Disp: 90 tablet, Rfl: 1 .  aspirin EC 81 MG tablet, Take 81 mg by mouth daily., Disp: , Rfl:  .  atorvastatin (LIPITOR) 40 MG tablet, TAKE 1 TABLET(40 MG) BY MOUTH DAILY, Disp: 30 tablet, Rfl: 2 .  budesonide (PULMICORT) 0.5 MG/2ML nebulizer solution, Take 2 mLs (0.5 mg total) by nebulization 2 (two) times daily., Disp: 1440 mL, Rfl: 10 .  cetirizine (ZYRTEC) 10 MG tablet, Take 1 tablet (10 mg total) by  mouth daily., Disp: 30 tablet, Rfl: 5 .  Cholecalciferol 50 MCG (2000 UT) TABS, Take 1 tablet by mouth daily., Disp: , Rfl:  .  cyclobenzaprine (FLEXERIL) 10 MG tablet, TAKE 1 TABLET BY MOUTH 2 TIMES DAILY AS NEEDED FOR MUSCLE SPASMS, Disp: 180 tablet, Rfl: 1 .  formoterol (PERFOROMIST) 20 MCG/2ML nebulizer solution, Take 2 mLs (20 mcg total) by nebulization 2 (two) times daily., Disp: 1440 mL, Rfl: 10 .  hydrOXYzine (ATARAX/VISTARIL) 10 MG tablet, TAKE 1 TO 2 TABLETS(10 TO 20 MG) BY MOUTH THREE TIMES DAILY AS NEEDED FOR ANXIETY OR INSOMNIA, Disp: 180 tablet, Rfl: 1 .   morphine (MS CONTIN) 15 MG 12 hr tablet, Take 1 tablet (15 mg total) by mouth every 12 (twelve) hours as needed for pain., Disp: 60 tablet, Rfl: 0 .  Multiple Vitamin (MULTIVITAMIN WITH MINERALS) TABS tablet, Take 1 tablet by mouth daily., Disp: , Rfl:  .  nicotine (NICODERM CQ - DOSED IN MG/24 HOURS) 14 mg/24hr patch, Place 14 mg onto the skin daily., Disp: , Rfl:  .  omeprazole (PRILOSEC) 20 MG capsule, Take by mouth daily., Disp: , Rfl:  .  predniSONE (DELTASONE) 10 MG tablet, Take 1 tablet (10 mg total) by mouth daily with breakfast., Disp: 90 tablet, Rfl: 1 .  sertraline (ZOLOFT) 100 MG tablet, Take 1 tablet (100 mg total) by mouth daily., Disp: 90 tablet, Rfl: 0 .  amLODipine (NORVASC) 5 MG tablet, Take 5 mg by mouth daily., Disp: , Rfl:  .  guaiFENesin (MUCINEX) 600 MG 12 hr tablet, Take 2 tablets (1,200 mg total) by mouth 2 (two) times daily as needed for cough or to loosen phlegm. (Patient not taking: Reported on 10/14/2019), Disp: 120 tablet, Rfl: 2 .  nicotine (NICODERM CQ) 14 mg/24hr patch, Place 1 patch (14 mg total) onto the skin daily. (Patient not taking: Reported on 10/14/2019), Disp: 28 patch, Rfl: 0  Dep[facility] screen [facility] 08/19/2019 05/27/2019 11/13/2018  Decreased Interest 1 1 0  Down, Depressed, Hopeless 1 1 0  PHQ - 2 Score 2 2 0  Altered sleeping 1 1 1   Tired, decreased energy 1 1 1   Change in appetite 0 0 0  Feeling bad or failure about yourself  0 0 0  Trouble concentrating 0 0 0  Moving slowly or fidgety/restless 0 1 0  Suicidal thoughts 0 0 0  PHQ-9 Score 4 5 2   Difficult doing work/chores Not difficult at all Not difficult at all Not difficult at all    GAD 7 : Generalized Anxiety Score 08/13/2018 05/14/2018  Nervous, Anxious, on Edge 1 2  Control/stop worrying 0 3  Worry too much - different things 1 3  Trouble relaxing 0 2  Restless 0 0  Easily annoyed or irritable 0 2  Afraid - awful might happen 0 3  Total GAD 7 Score 2 15  Anxiety Difficulty Not  difficult at all Not difficult at all    -------------------------------------------------------------------------- O: No physical exam performed due to remote telephone encounter.  Physical Exam: Patient remotely monitored without video.  Verbal communication appropriate.  Cognition normal.  Recent Results (from the past 2160 hour(s))  CBC with Differential     Status: Abnormal   Collection Time: 08/01/19  4:49 PM  Result Value Ref Range   WBC 9.3 4.0 - 10.5 K/uL   RBC 2.21 (L) 3.87 - 5.11 MIL/uL   Hemoglobin 6.3 (L) 12.0 - 15.0 g/dL   HCT 16.120.9 (L) 36 - 46 %  MCV 94.6 80.0 - 100.0 fL   MCH 28.5 26.0 - 34.0 pg   MCHC 30.1 30.0 - 36.0 g/dL   RDW 16.1 09.6 - 04.5 %   Platelets 175 150 - 400 K/uL   nRBC 0.0 0.0 - 0.2 %   Neutrophils Relative % 93 %   Neutro Abs 8.6 (H) 1.7 - 7.7 K/uL   Lymphocytes Relative 4 %   Lymphs Abs 0.3 (L) 0.7 - 4.0 K/uL   Monocytes Relative 3 %   Monocytes Absolute 0.3 0 - 1 K/uL   Eosinophils Relative 0 %   Eosinophils Absolute 0.0 0 - 0 K/uL   Basophils Relative 0 %   Basophils Absolute 0.0 0 - 0 K/uL   Immature Granulocytes 0 %   Abs Immature Granulocytes 0.04 0.00 - 0.07 K/uL    Comment: Performed at [facility], [address], [place], [place] [address]  Basic metabolic panel     Status: Abnormal   Collection Time: 08/01/19  4:49 PM  Result Value Ref Range   Sodium 141 135 - 145 mmol/L   Potassium 4.8 3.5 - 5.1 mmol/L   Chloride 92 (L) 98 - 111 mmol/L   CO2 39 (H) 22 - 32 mmol/L   Glucose, Bld 135 (H) 70 - 99 mg/dL    Comment: Glucose reference range applies only to samples taken after fasting for at least 8 hours.   BUN 18 6 - 20 mg/dL   Creatinine, Ser 1.91 0.44 - 1.00 mg/dL   Calcium 8.8 (L) 8.9 - 10.3 mg/dL   GFR calc non Af Amer >60 >60 mL/min   GFR calc Af Amer >60 >60 mL/min   Anion gap 10 5 - 15    Comment: Performed at [facility], [address] [place], [place] [address]  Brain natriuretic peptide      Status: Abnormal   Collection Time: 08/01/19  4:49 PM  Result Value Ref Range   B Natriuretic Peptide 135.6 (H) 0.0 - 100.0 pg/mL    Comment: Performed at [facility], [address] [place], [place] [address]  Troponin I (High Sensitivity)     Status: Abnormal   Collection Time: 08/01/19  4:49 PM  Result Value Ref Range   Troponin I (High Sensitivity) 18 (H) <18 ng/L    Comment: (NOTE) Elevated high sensitivity troponin I (hsTnI) values and significant  changes across serial measurements may suggest ACS but many other  chronic and acute conditions are known to elevate hsTnI results.  Refer to the "Links" section for chest pain algorithms and additional  guidance. Performed at [facility], [address] [place], [place] [address]   Lactic acid, plasma     Status: None   Collection Time: 08/01/19  4:49 PM  Result Value Ref Range   Lactic Acid, Venous 0.6 0.5 - 1.9 mmol/L    Comment: Performed at [facility], [address] [place], [place] [address]  CBC     Status: Abnormal   Collection Time: 08/01/19  5:49 PM  Result Value Ref Range   WBC 6.8 4.0 - 10.5 K/uL   RBC 1.79 (L) 3.87 - 5.11 MIL/uL   Hemoglobin 5.1 (L) 12.0 - 15.0 g/dL   HCT 29.5 (L) 36 - 46 %   MCV 96.1 80.0 - 100.0 fL   MCH 28.5 26.0 - 34.0 pg   MCHC 29.7 (L) 30.0 - 36.0 g/dL   RDW 28.4 13.2 - 44.0 %   Platelets  142 (L) 150 - 400 K/uL   nRBC 0.0 0.0 - 0.2 %    Comment: Performed at [facility], [address] [place], [place] [address]  Blood culture (routine x 2)     Status: None   Collection Time: 08/01/19  5:49 PM   Specimen: BLOOD  Result Value Ref Range   Specimen Description BLOOD RIGHT ANTECUBITAL    Special Requests      BOTTLES DRAWN AEROBIC AND ANAEROBIC Blood Culture adequate volume   Culture      NO GROWTH 5 DAYS Performed at [facility], [address] [place], [place] [address]    Report Status 08/06/2019 FINAL   ABO/Rh     Status:  None   Collection Time: 08/01/19  5:49 PM  Result Value Ref Range   ABO/RH(D)      A POS Performed at [facility], [address] [place], [place] [address]   Blood culture (routine x 2)     Status: None   Collection Time: 08/01/19  5:54 PM   Specimen: BLOOD  Result Value Ref Range   Specimen Description BLOOD LEFT ANTECUBITAL    Special Requests      BOTTLES DRAWN AEROBIC AND ANAEROBIC Blood Culture adequate volume   Culture      NO GROWTH 5 DAYS Performed at [facility], [address] [place], [place] [address]    Report Status 08/06/2019 FINAL   Type and screen     Status: None   Collection Time: 08/01/19  6:25 PM  Result Value Ref Range   ABO/RH(D) A POS    Antibody Screen NEG    Sample Expiration 08/04/2019,2359    Unit Number [id number]    Blood Component Type RED CELLS,LR    Unit division 00    Status of Unit ISSUED,FINAL    Transfusion Status OK TO TRANSFUSE    Crossmatch Result      Compatible Performed at [facility], [address], [place], [place] [address]    Unit Number [id number]    Blood Component Type RED CELLS,LR    Unit division 00    Status of Unit ISSUED,FINAL    Transfusion Status OK TO TRANSFUSE    Crossmatch Result Compatible   BPAM RBC     Status: None   Collection Time: 08/01/19  6:25 PM  Result Value Ref Range   ISSUE DATE / TIME 272536644034    Blood Product Unit Number [id number]    PRODUCT CODE [id number]    Unit Type and Rh 6200    Blood Product Expiration Date 416606301601    ISSUE DATE / TIME 093235573220    Blood Product Unit Number [id number]    PRODUCT CODE [id number]    Unit Type and Rh 6200    Blood Product Expiration Date 073710626948   SARS Coronavirus 2 by RT PCR (hospital order, performed in [facility] hospital lab) Nasopharyngeal Nasopharyngeal Swab     Status: None   Collection Time: 08/01/19  6:30 PM   Specimen: Nasopharyngeal Swab  Result Value Ref Range   SARS  Coronavirus 2 NEGATIVE NEGATIVE    Comment: (NOTE) SARS-CoV-2 target nucleic acids are NOT DETECTED.  The SARS-CoV-2 RNA is generally detectable in upper and lower respiratory specimens during the acute phase of infection. The lowest concentration of SARS-CoV-2 viral copies this assay can detect is 250 copies / mL. A negative result does not preclude SARS-CoV-2 infection and should not be used as the sole  basis for treatment or other patient management decisions.  A negative result may occur with improper specimen collection / handling, submission of specimen other than nasopharyngeal swab, presence of viral mutation(s) within the areas targeted by this assay, and inadequate number of viral copies (<250 copies / mL). A negative result must be combined with clinical observations, patient history, and epidemiological information.  Fact Sheet for Patients:   BoilerBrush.com.cy  Fact Sheet for Healthcare Providers: https://pope.com/  This test is not yet approved or  cleared by the [place] FDA and has been authorized for detection and/or diagnosis of SARS-CoV-2 by FDA under an Emergency Use Authorization (EUA).  This EUA will remain in effect (meaning this test can be used) for the duration of the COVID-19 declaration under Section 564(b)(1) of the Act, 21 U.S.C. section 360bbb-3(b)(1), unless the authorization is terminated or revoked sooner.  Performed at [facility], [address] [place], [place] [address]   Prepare RBC (crossmatch)     Status: None   Collection Time: 08/01/19  8:00 PM  Result Value Ref Range   Order Confirmation      ORDER PROCESSED BY BLOOD BANK Performed at [facility], [address] [place], [place] [address]   Troponin I (High Sensitivity)     Status: None   Collection Time: 08/01/19 10:26 PM  Result Value Ref Range   Troponin I (High Sensitivity) 17 <18 ng/L    Comment:  (NOTE) Elevated high sensitivity troponin I (hsTnI) values and significant  changes across serial measurements may suggest ACS but many other  chronic and acute conditions are known to elevate hsTnI results.  Refer to the "Links" section for chest pain algorithms and additional  guidance. Performed at [facility], [address] [place], [place] [address]   HIV Antibody (routine testing w rflx)     Status: None   Collection Time: 08/01/19 10:26 PM  Result Value Ref Range   HIV Screen 4th Generation wRfx Non Reactive Non Reactive    Comment: Performed at [facility], [address], [place], [place] [address]  Hemoglobin and hematocrit, blood     Status: None   Collection Time: 08/02/19  9:37 AM  Result Value Ref Range   Hemoglobin 12.8 12.0 - 15.0 g/dL    Comment: REPEATED TO VERIFY   HCT 41.3 36 - 46 %    Comment: Performed at [facility], [address] [place], [place] [address]  Blood gas, arterial     Status: Abnormal   Collection Time: 08/02/19 10:02 AM  Result Value Ref Range   FIO2 0.36    Delivery systems NASAL CANNULA    pH, Arterial 7.51 (H) 7.35 - 7.45   pCO2 arterial 54 (H) 32 - 48 mmHg   pO2, Arterial 53 (L) 83 - 108 mmHg   Bicarbonate 43.1 (H) 20.0 - 28.0 mmol/L   Acid-Base Excess 17.3 (H) 0.0 - 2.0 mmol/L   O2 Saturation 90.3 %   Patient temperature 37.0    Collection site RIGHT BRACHIAL    Sample type ARTERIAL DRAW    Allens test (pass/fail) PASS PASS    Comment: Performed at [facility], [address], [place], [place] [address]  Basic metabolic panel     Status: Abnormal   Collection Time: 08/03/19  4:32 AM  Result Value Ref Range   Sodium 140 135 - 145 mmol/L   Potassium 3.6 3.5 - 5.1 mmol/L   Chloride 95 (L) 98 -  111 mmol/L   CO2 33 (H) 22 - 32 mmol/L   Glucose, Bld 146 (H) 70 - 99 mg/dL    Comment: Glucose reference range applies only to samples taken after fasting for at least 8 hours.   BUN 19 6 - 20  mg/dL   Creatinine, Ser 6.57 0.44 - 1.00 mg/dL   Calcium 9.1 8.9 - 84.6 mg/dL   GFR calc non Af Amer >60 >60 mL/min   GFR calc Af Amer >60 >60 mL/min   Anion gap 12 5 - 15    Comment: Performed at [facility], [address] [place], [place] [address]  CBC     Status: Abnormal   Collection Time: 08/03/19  6:18 AM  Result Value Ref Range   WBC 11.9 (H) 4.0 - 10.5 K/uL   RBC 4.54 3.87 - 5.11 MIL/uL   Hemoglobin 12.4 12.0 - 15.0 g/dL   HCT 28.4 36 - 46 %   MCV 85.5 80.0 - 100.0 fL    Comment: POST TRANSFUSION SPECIMEN   MCH 27.3 26.0 - 34.0 pg   MCHC 32.0 30.0 - 36.0 g/dL   RDW 13.2 44.0 - 10.2 %   Platelets 301 150 - 400 K/uL    Comment: POST TRANSFUSION SPECIMEN   nRBC 0.0 0.0 - 0.2 %    Comment: Performed at [facility], [address] [place], [place] [address]  CBC     Status: Abnormal   Collection Time: 08/04/19  3:57 AM  Result Value Ref Range   WBC 13.8 (H) 4.0 - 10.5 K/uL   RBC 4.93 3.87 - 5.11 MIL/uL   Hemoglobin 13.5 12.0 - 15.0 g/dL   HCT 64.4 36 - 46 %   MCV 87.8 80.0 - 100.0 fL   MCH 27.4 26.0 - 34.0 pg   MCHC 31.2 30.0 - 36.0 g/dL   RDW 03.4 74.2 - 59.5 %   Platelets 334 150 - 400 K/uL   nRBC 0.0 0.0 - 0.2 %    Comment: Performed at [facility], [address] [place], [place] [address]  Basic metabolic panel     Status: Abnormal   Collection Time: 08/04/19  3:57 AM  Result Value Ref Range   Sodium 139 135 - 145 mmol/L   Potassium 4.6 3.5 - 5.1 mmol/L   Chloride 96 (L) 98 - 111 mmol/L   CO2 32 22 - 32 mmol/L   Glucose, Bld 142 (H) 70 - 99 mg/dL    Comment: Glucose reference range applies only to samples taken after fasting for at least 8 hours.   BUN 26 (H) 6 - 20 mg/dL   Creatinine, Ser 6.43 0.44 - 1.00 mg/dL   Calcium 9.6 8.9 - 32.9 mg/dL   GFR calc non Af Amer >60 >60 mL/min   GFR calc Af Amer >60 >60 mL/min   Anion gap 11 5 - 15    Comment: Performed at [facility], [address] [place], [place]  [address]    -------------------------------------------------------------------------- A&P:  Problem List Items Addressed This Visit      Respiratory   Chronic respiratory failure (HCC) - Primary    Patient has been admitted to hospice on 10/12/2019.  Is now unsure if she would like to stay on hospice or return to palliative care.  Case discussed with F[place] Pho and she will be at Ms. Schoene's home on Wednesday for re-evaluation and discussion of services.    Plan: 1. To review after next hospice  home evaluation on 10/16/2019.         No orders of the defined types were placed in this encounter.   Follow-up: - To follow up after next hospice in home visit on 10/16/2019  Patient verbalizes understanding with the above medical recommendations including the limitation of remote medical advice.  Specific follow-up and call-back criteria were given for patient to follow-up or seek medical care more urgently if needed.  - Time spent in direct consultation with patient on phone: 14 minutes  Charlaine Dalton, FNP-C [facility] 10/14/2019, 4:50 PM

## 2019-10-15 NOTE — Telephone Encounter (Signed)
Pt is needing a call back from [facility] nurse regarding medication . Please advise

## 2019-10-16 ENCOUNTER — Other Ambulatory Visit: Payer: Self-pay | Admitting: Family Medicine

## 2019-10-16 DIAGNOSIS — F419 Anxiety disorder, unspecified: Secondary | ICD-10-CM

## 2019-10-16 DIAGNOSIS — J9611 Chronic respiratory failure with hypoxia: Secondary | ICD-10-CM

## 2019-10-16 DIAGNOSIS — Z515 Encounter for palliative care: Secondary | ICD-10-CM

## 2019-10-16 DIAGNOSIS — R0602 Shortness of breath: Secondary | ICD-10-CM

## 2019-10-16 MED ORDER — LORAZEPAM 0.5 MG PO TABS: 0.5000 mg | ORAL_TABLET | ORAL | 1 refills | Status: AC | PRN

## 2019-10-16 MED ORDER — MORPHINE SULFATE ER 15 MG PO TBCR: 15.0000 mg | EXTENDED_RELEASE_TABLET | Freq: Three times a day (TID) | ORAL | 0 refills | Status: DC | PRN

## 2019-10-16 MED ORDER — MORPHINE SULFATE (CONCENTRATE) 10 MG/0.5ML PO SOLN: 5.0000 mg | ORAL | 0 refills | Status: DC | PRN

## 2019-10-16 NOTE — Progress Notes (Signed)
Spoke with Judeth Cornfield with [facility], Requesting medication changes for Alicia Gibson, requesting to add lorazepam 0.5mg  every 4 hours as needed for pain/anxiety, refill on Morphine Sulfate Concentrate to 0.2mL (5mg ) every 2 hours as needed for pain, MS Contin 15mg  extended release every 8 hours for pain.  Discussed can change treatment plan, but have deferred symptom management to [facility] Provider and will remain on as attending.  Rx sent to pharmacy on file.

## 2019-10-30 ENCOUNTER — Telehealth: Payer: Self-pay

## 2019-10-30 ENCOUNTER — Telehealth: Payer: Self-pay | Admitting: Pharmacist

## 2019-10-30 NOTE — Chronic Care Management (AMB) (Signed)
  Chronic Care Management   Outreach Note  10/30/2019 Name: Alicia Gibson MRN: [id number] DOB: September 02, 1960  Referred by: [name], FNP Reason for referral : No chief complaint on file.  Was unable to reach patient via telephone today and left HIPAA compliant voicemail.   Perform chart review and collaborate with PCP. Provider advised that patient is now enrolled with hospice services; CCM service no longer needed.   Follow Up Plan: CCM team notified and will close CCM services for patient at this time.  Duanne Moron, PharmD, [facility] Clinical Pharmacist [facility] Care Management [phone]

## 2019-11-04 ENCOUNTER — Encounter: Payer: Self-pay | Admitting: Family Medicine

## 2019-11-04 ENCOUNTER — Other Ambulatory Visit: Payer: Self-pay | Admitting: Family Medicine

## 2019-11-04 DIAGNOSIS — J9611 Chronic respiratory failure with hypoxia: Secondary | ICD-10-CM

## 2019-11-04 DIAGNOSIS — Z515 Encounter for palliative care: Secondary | ICD-10-CM

## 2019-11-04 DIAGNOSIS — R0602 Shortness of breath: Secondary | ICD-10-CM

## 2019-11-04 MED ORDER — MORPHINE SULFATE ER 15 MG PO TBCR: 15.0000 mg | EXTENDED_RELEASE_TABLET | Freq: Two times a day (BID) | ORAL | 0 refills | Status: DC

## 2019-11-04 NOTE — Progress Notes (Signed)
Personally reviewed [place] today, 11/04/19.  According to PMP aware, pt last received a refill on 10/07/2019.  Refill of her controlled substance will be provided today.

## 2019-11-06 ENCOUNTER — Other Ambulatory Visit: Payer: Self-pay

## 2019-11-06 DIAGNOSIS — R062 Wheezing: Secondary | ICD-10-CM

## 2019-11-06 DIAGNOSIS — J441 Chronic obstructive pulmonary disease with (acute) exacerbation: Secondary | ICD-10-CM

## 2019-11-06 MED ORDER — FORMOTEROL FUMARATE 20 MCG/2ML IN NEBU: 20.0000 ug | INHALATION_SOLUTION | Freq: Two times a day (BID) | RESPIRATORY_TRACT | 10 refills | Status: DC

## 2019-11-11 ENCOUNTER — Telehealth: Payer: Self-pay

## 2019-11-14 ENCOUNTER — Telehealth: Payer: Self-pay | Admitting: Family Medicine

## 2019-11-14 ENCOUNTER — Other Ambulatory Visit: Payer: Self-pay | Admitting: Family Medicine

## 2019-11-14 DIAGNOSIS — J441 Chronic obstructive pulmonary disease with (acute) exacerbation: Secondary | ICD-10-CM

## 2019-11-14 DIAGNOSIS — R062 Wheezing: Secondary | ICD-10-CM

## 2019-11-14 DIAGNOSIS — Z515 Encounter for palliative care: Secondary | ICD-10-CM

## 2019-11-14 MED ORDER — FORMOTEROL FUMARATE 20 MCG/2ML IN NEBU: 20.0000 ug | INHALATION_SOLUTION | Freq: Two times a day (BID) | RESPIRATORY_TRACT | 10 refills | Status: AC

## 2019-11-14 MED ORDER — MORPHINE SULFATE (CONCENTRATE) 10 MG/0.5ML PO SOLN: 5.0000 mg | ORAL | 0 refills | Status: DC | PRN

## 2019-11-14 NOTE — Telephone Encounter (Signed)
Mitzi from [facility] called to request med refills   [phone] call back request   formoterol (PERFOROMIST) 20 MCG/2ML nebulizer solution - twice daily - apparently the pharmacy filled this is as a capsule however solution is needed    Morphine Sulfate (MORPHINE CONCENTRATE) 20 MG/1.0ML SOLN concentrated solution   0.25 ML every two hours as needed for shortness of breath   [id number] [place], [place] - [address] AT [facility] [address] [place]  [address] [place] [address]  Phone: [phone] Fax: [phone]

## 2019-11-14 NOTE — Telephone Encounter (Signed)
Personally reviewed [place] today, 11/14/19.  According to PMP aware, pt last received a refill on 10/21/2019.  Refill of her controlled substance will be provided today.

## 2019-11-14 NOTE — Telephone Encounter (Signed)
Alicia Gibson with [facility] care is calling in for verbal orders to change pt's Performance Nebulizer to a Duo Nebulizer instead?     Please assist   CB: [phone]

## 2019-11-14 NOTE — Telephone Encounter (Signed)
Requested medication (s) are due for refill today - unknown  Requested medication (s) are on the active medication list -yes  Future visit scheduled -no  Last refill: 10/16/19  Notes to clinic: Request for RF- non delegated rx. May need to clarify route/form of formoterol at pharmacy- patient states they received pill form  Requested Prescriptions  Pending Prescriptions Disp Refills   Morphine Sulfate (MORPHINE CONCENTRATE) 10 MG/0.5ML SOLN concentrated solution 30 mL 0    Sig: Take 0.25 mLs (5 mg total) by mouth every 2 (two) hours as needed for severe pain or shortness of breath.      Not Delegated - Analgesics:  Opioid Agonists Failed - 11/14/2019 11:04 AM      Failed - This refill cannot be delegated      Failed - Urine Drug Screen completed in last 360 days.      Passed - Valid encounter within last 6 months    Recent Outpatient Visits           1 month ago Chronic respiratory failure with hypoxia [facility])   [facility], Jodelle Gross, FNP   1 month ago Chronic respiratory failure with hypoxia [facility])   [facility], Jodelle Gross, FNP   2 months ago Chronic bronchitis with COPD (chronic obstructive pulmonary disease) (HCC)   [facility], Jodelle Gross, FNP   2 months ago Pulmonary emphysema, unspecified emphysema type [facility])   [facility], Jodelle Gross, FNP   3 months ago Palliative care by specialist   [facility], [facility]odelle [facility], FNP                formoterol (PERFOROMIST) 20 MCG/2ML nebulizer solution 1440 mL 10    Sig: Take 2 mLs (20 mcg total) by nebulization 2 (two) times daily.      Pulmonology: Beta Agonists - formoterol fumarate Failed - 11/14/2019 11:04 AM      Failed - One inhaler should last at least one month. If the patient is requesting refills earlier, contact the patient to check for uncontrolled symptoms.      Passed - K in normal range and within 360 days     Potassium  Date Value Ref Range Status  08/04/2019 4.6 3.5 - 5.1 mmol/L Final          Passed - Valid encounter within last 12 months    Recent Outpatient Visits           1 month ago Chronic respiratory failure with hypoxia [facility])   [facility], Jodelle Gross, FNP   1 month ago Chronic respiratory failure with hypoxia [facility])   [facility], Jodelle Gross, FNP   2 months ago Chronic bronchitis with COPD (chronic obstructive pulmonary disease) (HCC)   [facility], [facility], FNP   2 months ago Pulmonary emphysema, unspecified emphysema type [facility])   [facility], Jodelle Gross, FNP   3 months ago Palliative care by specialist   [facility], Jodelle Gross, FNP                  Requested Prescriptions  Pending Prescriptions Disp Refills   Morphine Sulfate (MORPHINE CONCENTRATE) 10 MG/0.5ML SOLN concentrated solution 30 mL 0    Sig: Take 0.25 mLs (5 mg total) by mouth every 2 (two) hours as needed for severe pain or shortness of breath.  Not Delegated - Analgesics:  Opioid Agonists Failed - 11/14/2019 11:04 AM      Failed - This refill cannot be delegated      Failed - Urine Drug Screen completed in last 360 days.      Passed - Valid encounter within last 6 months    Recent Outpatient Visits           1 month ago Chronic respiratory failure with hypoxia [facility])   [facility], Jodelle Gross, FNP   1 month ago Chronic respiratory failure with hypoxia [facility])   [facility], Jodelle Gross, FNP   2 months ago Chronic bronchitis with COPD (chronic obstructive pulmonary disease) (HCC)   [facility], [facility]elle [facility], FNP   2 months ago Pulmonary emphysema, unspecified emphysema type [facility])   [facility], Jodelle Gross, FNP   3 months ago Palliative care by specialist   [facility], Jodelle Gross, FNP                 formoterol (PERFOROMIST) 20 MCG/2ML nebulizer solution 1440 mL 10    Sig: Take 2 mLs (20 mcg total) by nebulization 2 (two) times daily.      Pulmonology: Beta Agonists - formoterol fumarate Failed - 11/14/2019 11:04 AM      Failed - One inhaler should last at least one month. If the patient is requesting refills earlier, contact the patient to check for uncontrolled symptoms.      Passed - K in normal range and within 360 days    Potassium  Date Value Ref Range Status  08/04/2019 4.6 3.5 - 5.1 mmol/L Final          Passed - Valid encounter within last 12 months    Recent Outpatient Visits           1 month ago Chronic respiratory failure with hypoxia [facility])   [facility], Jodelle Gross, FNP   1 month ago Chronic respiratory failure with hypoxia [facility])   [facility], Jodelle Gross, FNP   2 months ago Chronic bronchitis with COPD (chronic obstructive pulmonary disease) (HCC)   [facility], Jodelle Gross, FNP   2 months ago Pulmonary emphysema, unspecified emphysema type [facility])   [facility], Jodelle Gross, FNP   3 months ago Palliative care by specialist   [facility], Jodelle Gross, FNP

## 2019-11-15 ENCOUNTER — Other Ambulatory Visit: Payer: Self-pay | Admitting: Family Medicine

## 2019-11-15 NOTE — Telephone Encounter (Signed)
Requested Prescriptions  Pending Prescriptions Disp Refills   omeprazole (PRILOSEC) 20 MG capsule [Pharmacy Med Name: OMEPRAZOLE 20MG  CAPSULES] 90 capsule     Sig: TAKE 1 TABLET BY MOUTH DAILY     Gastroenterology: Proton Pump Inhibitors Passed - 11/15/2019  1:46 PM      Passed - Valid encounter within last 12 months    Recent Outpatient Visits          1 month ago Chronic respiratory failure with hypoxia [facility])   [facility], [facility], FNP   1 month ago Chronic respiratory failure with hypoxia [facility])   [facility], [facility], FNP   2 months ago Chronic bronchitis with COPD (chronic obstructive pulmonary disease) (HCC)   [facility], [facility], FNP   2 months ago Pulmonary emphysema, unspecified emphysema type [facility])   [facility], [facility], FNP   3 months ago Palliative care by specialist   [facility], [facility], FNP

## 2019-11-15 NOTE — Telephone Encounter (Signed)
Left message for patient to call back  

## 2019-11-19 ENCOUNTER — Other Ambulatory Visit: Payer: Self-pay | Admitting: Family Medicine

## 2019-11-19 ENCOUNTER — Telehealth: Payer: Self-pay | Admitting: Family Medicine

## 2019-11-19 DIAGNOSIS — Z515 Encounter for palliative care: Secondary | ICD-10-CM

## 2019-11-19 DIAGNOSIS — J9611 Chronic respiratory failure with hypoxia: Secondary | ICD-10-CM

## 2019-11-19 DIAGNOSIS — R0602 Shortness of breath: Secondary | ICD-10-CM

## 2019-11-19 MED ORDER — MORPHINE SULFATE ER 15 MG PO TBCR: 15.0000 mg | EXTENDED_RELEASE_TABLET | Freq: Two times a day (BID) | ORAL | 0 refills | Status: DC

## 2019-11-19 NOTE — Telephone Encounter (Signed)
Call from Caban at [facility] to let FNP know Ms. Nass would run out of her MS Contin tomorrow, requesting refill to be sent in.  Refill sent to pharmacy on file.

## 2019-11-21 ENCOUNTER — Other Ambulatory Visit: Payer: Self-pay | Admitting: Family Medicine

## 2019-11-21 DIAGNOSIS — E785 Hyperlipidemia, unspecified: Secondary | ICD-10-CM

## 2019-11-30 ENCOUNTER — Other Ambulatory Visit: Payer: Self-pay | Admitting: Family Medicine

## 2019-11-30 DIAGNOSIS — F32A Depression, unspecified: Secondary | ICD-10-CM

## 2019-11-30 NOTE — Telephone Encounter (Signed)
Requested Prescriptions  Pending Prescriptions Disp Refills   hydrOXYzine (ATARAX/VISTARIL) 10 MG tablet [Pharmacy Med Name: HYDROXYZINE HCL 10MG  TABLETS] 180 tablet 1    Sig: TAKE 1 TO 2 TABLETS(10 TO 20 MG) BY MOUTH THREE TIMES DAILY AS NEEDED FOR ANXIETY OR INSOMNIA     Ear, Nose, and Throat:  Antihistamines Passed - 11/30/2019  4:18 PM      Passed - Valid encounter within last 12 months    Recent Outpatient Visits          1 month ago Chronic respiratory failure with hypoxia [facility])   [facility], [facility], FNP   2 months ago Chronic respiratory failure with hypoxia [facility])   [facility], [facility], FNP   2 months ago Chronic bronchitis with COPD (chronic obstructive pulmonary disease) (HCC)   [facility], [facility], FNP   3 months ago Pulmonary emphysema, unspecified emphysema type [facility])   [facility], [facility], FNP   3 months ago Palliative care by specialist   [facility], [facility], FNP

## 2019-12-02 ENCOUNTER — Other Ambulatory Visit: Payer: Self-pay | Admitting: Family Medicine

## 2019-12-02 DIAGNOSIS — Z515 Encounter for palliative care: Secondary | ICD-10-CM

## 2019-12-02 DIAGNOSIS — J9611 Chronic respiratory failure with hypoxia: Secondary | ICD-10-CM

## 2019-12-02 DIAGNOSIS — R0602 Shortness of breath: Secondary | ICD-10-CM

## 2019-12-02 MED ORDER — MORPHINE SULFATE ER 15 MG PO TBCR: 15.0000 mg | EXTENDED_RELEASE_TABLET | Freq: Two times a day (BID) | ORAL | 0 refills | Status: DC

## 2019-12-02 NOTE — Telephone Encounter (Signed)
Pt needs refill for morphine (MS CONTIN) 15 MG 12 hr tablet  Sent to  [id number] - [place], [place] - [address] AT [facility] OF [address] [facility] Phone:  [phone]  Fax:  [phone]       Mitzi from Hospice stated that they have directions for the Pt to take morphine (MS CONTIN) 15 MG 12 hr tablet  Every 8 hrs / please advise if this change is correct and send a new Rx to [facility] or give Mitzi a call to let her know the correct directions if they are to be taken every 12 hrs so she can correct the schedule / please advise

## 2019-12-02 NOTE — Telephone Encounter (Signed)
[place]ally [place], 12/02/19.  According to PMP aware, pt last received a refill on 11/27/2019 for 2 days.  Refill of her controlled substance will be provided today.

## 2019-12-02 NOTE — Telephone Encounter (Signed)
Called Alicia Gibson and informed her that MS Contin is ordered to give every 12 hors. Alicia Gibson verbalized understanding.  Last RF: 11/19/19 #30 RF due: yes Active med list: yes Future visit scheduled: no  Routing to PCP- NT not delegated to RF med

## 2019-12-02 NOTE — Telephone Encounter (Signed)
Please read note below

## 2019-12-03 ENCOUNTER — Other Ambulatory Visit: Payer: Self-pay | Admitting: Family Medicine

## 2019-12-03 NOTE — Telephone Encounter (Signed)
Requested medication (s) are due for refill today: Yes  Requested medication (s) are on the active medication list: Yes  Last refill:  10/14/19  Future visit scheduled: No  Notes to clinic:  Historical provider.    Requested Prescriptions  Pending Prescriptions Disp Refills   amLODipine (NORVASC) 5 MG tablet [Pharmacy Med Name: AMLODIPINE BESYLATE 5MG  TABLETS] 30 tablet     Sig: TAKE 1 TABLET(5 MG) BY MOUTH DAILY      Cardiovascular:  Calcium Channel Blockers Passed - 12/03/2019 10:43 AM      Passed - Last BP in normal range    BP Readings from Last 1 Encounters:  09/18/19 132/71          Passed - Valid encounter within last 6 months    Recent Outpatient Visits           1 month ago Chronic respiratory failure with hypoxia [facility])   [facility], [facility], FNP   2 months ago Chronic respiratory failure with hypoxia [facility])   [facility], [facility], FNP   3 months ago Chronic bronchitis with COPD (chronic obstructive pulmonary disease) (HCC)   [facility], [facility], FNP   3 months ago Pulmonary emphysema, unspecified emphysema type [facility])   [facility], [facility], FNP   3 months ago Palliative care by specialist   [facility], [facility], FNP

## 2019-12-12 ENCOUNTER — Other Ambulatory Visit: Payer: Self-pay | Admitting: Family Medicine

## 2019-12-12 DIAGNOSIS — J9611 Chronic respiratory failure with hypoxia: Secondary | ICD-10-CM

## 2019-12-12 DIAGNOSIS — R0602 Shortness of breath: Secondary | ICD-10-CM

## 2019-12-12 DIAGNOSIS — Z515 Encounter for palliative care: Secondary | ICD-10-CM

## 2019-12-12 NOTE — Telephone Encounter (Signed)
Copied from CRM [id number]. Topic: Quick Communication - Rx Refill/Question >> Dec 12, 2019  1:56 PM Jaquita Rector A wrote: Medication: morphine (MS CONTIN) 15 MG 12 hr tablet  Will be out on 12/15/19  Has the patient contacted their pharmacy? Yes.   (Agent: If no, request that the patient contact the pharmacy for the refill.) (Agent: If yes, when and what did the pharmacy advise?)  Preferred Pharmacy (with phone number or street name): [facility][id number] [place], [place] - [address] AT [facility] OF [address] [facility]  Phone:  [phone] Fax:  [phone]     Agent: Please be advised that RX refills may take up to 3 business days. We ask that you follow-up with your pharmacy.

## 2019-12-12 NOTE — Telephone Encounter (Signed)
Requested medication (s) are due for refill today: Early  Requested medication (s) are on the active medication list: Yes  Last refill:  12/02/19  Future visit scheduled: No  Notes to clinic:  See request.    Requested Prescriptions  Pending Prescriptions Disp Refills   morphine (MS CONTIN) 15 MG 12 hr tablet 30 tablet 0    Sig: Take 1 tablet (15 mg total) by mouth every 12 (twelve) hours for 15 days.      Not Delegated - Analgesics:  Opioid Agonists Failed - 12/12/2019  2:22 PM      Failed - This refill cannot be delegated      Failed - Urine Drug Screen completed in last 360 days      Passed - Valid encounter within last 6 months    Recent Outpatient Visits           1 month ago Chronic respiratory failure with hypoxia [facility])   [facility], Jodelle Gross, FNP   2 months ago Chronic respiratory failure with hypoxia [facility])   [facility], Jodelle Gross, FNP   3 months ago Chronic bronchitis with COPD (chronic obstructive pulmonary disease) (HCC)   [facility], Jodelle Gross, FNP   3 months ago Pulmonary emphysema, unspecified emphysema type [facility])   [facility], Jodelle Gross, FNP   4 months ago Palliative care by specialist   [facility], Jodelle Gross, FNP

## 2019-12-13 MED ORDER — MORPHINE SULFATE ER 15 MG PO TBCR: 15.0000 mg | EXTENDED_RELEASE_TABLET | Freq: Two times a day (BID) | ORAL | 0 refills | Status: DC

## 2019-12-22 ENCOUNTER — Other Ambulatory Visit: Payer: Self-pay | Admitting: Family Medicine

## 2019-12-22 DIAGNOSIS — E785 Hyperlipidemia, unspecified: Secondary | ICD-10-CM

## 2019-12-22 NOTE — Telephone Encounter (Signed)
Called pt and LM to call office to schedule appt for med refills. Pt previously given a 30 day courtesy RF- reusing this med. Routing to practice.

## 2019-12-23 ENCOUNTER — Other Ambulatory Visit: Payer: Disability Insurance

## 2020-01-01 ENCOUNTER — Telehealth: Payer: Self-pay | Admitting: Family Medicine

## 2020-01-01 NOTE — Telephone Encounter (Signed)
Copied from CRM [id number]. Topic: Quick Communication - Rx Refill/Question >> Jan 01, 2020  2:01 PM Jaquita Rector A wrote: Medication: morphine (MS CONTIN) 15 MG 12 hr tablet  Has the patient contacted their pharmacy? Yes.   (Agent: If no, request that the patient contact the pharmacy for the refill.) (Agent: If yes, when and what did the pharmacy advise?)  Preferred Pharmacy (with phone number or street name): [facility][id number] [place], [place] - [address] AT [facility] OF [address] [facility]  Phone:  [phone] Fax:  [phone]     Agent: Please be advised that RX refills may take up to 3 business days. We ask that you follow-up with your pharmacy.

## 2020-01-01 NOTE — Telephone Encounter (Signed)
Pt. Is requesting MS Contin 15 mg tablet; this order ended on 12/28/19.  Will send this request to PCP office for further review/ recommendation.

## 2020-01-02 ENCOUNTER — Other Ambulatory Visit: Payer: Self-pay | Admitting: Family Medicine

## 2020-01-02 DIAGNOSIS — R0602 Shortness of breath: Secondary | ICD-10-CM

## 2020-01-02 DIAGNOSIS — J9611 Chronic respiratory failure with hypoxia: Secondary | ICD-10-CM

## 2020-01-02 DIAGNOSIS — Z515 Encounter for palliative care: Secondary | ICD-10-CM

## 2020-01-02 MED ORDER — MORPHINE SULFATE ER 15 MG PO TBCR: 15.0000 mg | EXTENDED_RELEASE_TABLET | Freq: Two times a day (BID) | ORAL | 0 refills | Status: DC

## 2020-01-02 NOTE — Telephone Encounter (Signed)
Rx sent in

## 2020-01-08 ENCOUNTER — Telehealth: Payer: Self-pay

## 2020-01-08 NOTE — Telephone Encounter (Signed)
Copied from CRM [id number]. Topic: General - Other >> Jan 08, 2020  1:40 PM Jaquita Rector A wrote: Reason for CRM: Mitzi called in to inform Joni Reining that the patient is having  increased coughing, increased SOB, diminished lung more that usual Mucinex has loosened secretion but patient is unable to get it up and out. Request Prednisone taper. Please call Mitzi at  Ph# [phone]

## 2020-01-08 NOTE — Telephone Encounter (Signed)
Spoke to [employer] notified her that Joni Reining is not in the office for today and Dr Kirtland Bouchard is on admin she will send this Rx request to hospice coving physician.

## 2020-01-09 ENCOUNTER — Other Ambulatory Visit: Payer: Self-pay | Admitting: Family Medicine

## 2020-01-09 DIAGNOSIS — J449 Chronic obstructive pulmonary disease, unspecified: Secondary | ICD-10-CM

## 2020-01-09 MED ORDER — PREDNISONE 20 MG PO TABS: ORAL_TABLET | ORAL | 0 refills | Status: AC

## 2020-01-09 NOTE — Telephone Encounter (Signed)
Rx sent to pharmacy on file.

## 2020-01-14 ENCOUNTER — Other Ambulatory Visit: Payer: Self-pay | Admitting: Family Medicine

## 2020-01-14 DIAGNOSIS — J9611 Chronic respiratory failure with hypoxia: Secondary | ICD-10-CM

## 2020-01-14 DIAGNOSIS — R0602 Shortness of breath: Secondary | ICD-10-CM

## 2020-01-14 DIAGNOSIS — Z515 Encounter for palliative care: Secondary | ICD-10-CM

## 2020-01-14 NOTE — Telephone Encounter (Signed)
Please review for refill. Refills not delegated per protocol.  Last refill of Morphine Sulfate on 11/14/19, MS Contin on 01/02/20 #30

## 2020-01-14 NOTE — Telephone Encounter (Signed)
Medication Refill - Medication: MS contin, morphine sulphate   Has the patient contacted their pharmacy? Yes.   (Agent: If no, request that the patient contact the pharmacy for the refill.) (Agent: If yes, when and what did the pharmacy advise?)  Preferred Pharmacy (with phone number or street name):  [facility] #[id number] [place], [place] - [address] AT [facility] OF [address] [facility]  [address] [place] [address]  Phone: [phone] Fax: [phone]  Hours: Not open 24 hours     Agent: Please be advised that RX refills may take up to 3 business days. We ask that you follow-up with your pharmacy.

## 2020-01-15 MED ORDER — MORPHINE SULFATE ER 15 MG PO TBCR: 15.0000 mg | EXTENDED_RELEASE_TABLET | Freq: Two times a day (BID) | ORAL | 0 refills | Status: AC

## 2020-01-15 MED ORDER — MORPHINE SULFATE (CONCENTRATE) 10 MG/0.5ML PO SOLN: 5.0000 mg | ORAL | 0 refills | Status: DC | PRN

## 2020-01-15 NOTE — Telephone Encounter (Signed)
Personally reviewed [place] today, 01/15/20.  According to PMP aware, pt last received a refill on 01/02/2020.  Refill of her controlled substance will be provided today.

## 2020-01-26 ENCOUNTER — Other Ambulatory Visit: Payer: Self-pay | Admitting: Family Medicine

## 2020-01-26 DIAGNOSIS — F32A Depression, unspecified: Secondary | ICD-10-CM

## 2020-01-26 DIAGNOSIS — I10 Essential (primary) hypertension: Secondary | ICD-10-CM

## 2020-01-26 DIAGNOSIS — E785 Hyperlipidemia, unspecified: Secondary | ICD-10-CM

## 2020-01-27 NOTE — Telephone Encounter (Signed)
Requested medication (s) are due for refill today: yes  Requested medication (s) are on the active medication list: yes  Last refill:  12/23/19  Future visit scheduled: no  Notes to clinic:  overdue lab work -    Requested Prescriptions  Pending Prescriptions Disp Refills   atorvastatin (LIPITOR) 40 MG tablet [Pharmacy Med Name: ATORVASTATIN 40MG  TABLETS] 30 tablet     Sig: TAKE 1 TABLET(40 MG) BY MOUTH DAILY      Cardiovascular:  Antilipid - Statins Failed - 01/26/2020  7:03 AM      Failed - Total Cholesterol in normal range and within 360 days    Cholesterol, Total  Date Value Ref Range Status  02/13/2018 328 (H) 100 - 199 mg/dL Final   Cholesterol  Date Value Ref Range Status  11/09/2018 235 (H) <200 mg/dL Final          Failed - LDL in normal range and within 360 days    LDL Cholesterol (Calc)  Date Value Ref Range Status  11/09/2018 131 (H) mg/dL (calc) Final    Comment:    Reference range: <100 . Desirable range <100 mg/dL for primary prevention;   <70 mg/dL for patients with CHD or diabetic patients  with > or = 2 CHD risk factors. 11/11/2018 LDL-C is now calculated using the Martin-Hopkins  calculation, which is a validated novel method providing  better accuracy than the Friedewald equation in the  estimation of LDL-C.  Marland Kitchen et al. Horald Pollen. Lenox Ahr): 2061-2068  (http://education.[employer].com/faq/FAQ164)           Failed - HDL in normal range and within 360 days    HDL  Date Value Ref Range Status  11/09/2018 82 > OR = 50 mg/dL Final  11/11/2018 74 15/17/6160 mg/dL Final          Failed - Triglycerides in normal range and within 360 days    Triglycerides  Date Value Ref Range Status  11/09/2018 112 <150 mg/dL Final          Passed - Patient is not pregnant      Passed - Valid encounter within last 12 months    Recent Outpatient Visits           3 months ago Chronic respiratory failure with hypoxia (HCC)   [facility],  [facility], FNP   4 months ago Chronic respiratory failure with hypoxia [facility])   [facility], [facility], FNP   4 months ago Chronic bronchitis with COPD (chronic obstructive pulmonary disease) (HCC)   [facility], [facility], FNP   5 months ago Pulmonary emphysema, unspecified emphysema type (HCC)   [facility], [facility], FNP   5 months ago Palliative care by specialist   [facility], [facility], FNP                 Signed Prescriptions Disp Refills   hydrOXYzine (ATARAX/VISTARIL) 10 MG tablet 180 tablet 1    Sig: TAKE 1 TO 2 TABLETS(10 TO 20 MG) BY MOUTH THREE TIMES DAILY AS NEEDED FOR ANXIETY OR INSOMNIA      Ear, Nose, and Throat:  Antihistamines Passed - 01/26/2020  7:03 AM      Passed - Valid encounter within last 12 months    Recent Outpatient Visits           3 months ago Chronic respiratory failure with hypoxia (HCC)   03/25/2020  [facility], Jodelle Gross, FNP   4 months ago Chronic respiratory failure with hypoxia [facility])   [facility], Jodelle Gross, FNP   4 months ago Chronic bronchitis with COPD (chronic obstructive pulmonary disease) (HCC)   [facility], Jodelle Gross, FNP   5 months ago Pulmonary emphysema, unspecified emphysema type [facility])   [facility], Jodelle Gross, FNP   5 months ago Palliative care by specialist   [facility], Jodelle Gross, FNP                 Refused Prescriptions Disp Refills   amLODipine (NORVASC) 10 MG tablet [Pharmacy Med Name: AMLODIPINE BESYLATE 10MG  TABLETS] 90 tablet     Sig: TAKE 1 TABLET(10 MG) BY MOUTH DAILY      Cardiovascular:  Calcium Channel Blockers Passed - 01/26/2020  7:03 AM      Passed - Last BP in normal range    BP Readings from Last 1 Encounters:  09/18/19 132/71          Passed - Valid encounter within last 6 months    Recent Outpatient Visits            3 months ago Chronic respiratory failure with hypoxia [facility])   [facility], [facility], FNP   4 months ago Chronic respiratory failure with hypoxia [facility])   [facility], [facility], FNP   4 months ago Chronic bronchitis with COPD (chronic obstructive pulmonary disease) (HCC)   [facility], [facility], FNP   5 months ago Pulmonary emphysema, unspecified emphysema type [facility])   [facility], [facility], FNP   5 months ago Palliative care by specialist   [facility], [facility], FNP

## 2020-01-27 NOTE — Telephone Encounter (Signed)
Requested Prescriptions  Pending Prescriptions Disp Refills  . amLODipine (NORVASC) 10 MG tablet [Pharmacy Med Name: AMLODIPINE BESYLATE 10MG  TABLETS] 90 tablet     Sig: TAKE 1 TABLET(10 MG) BY MOUTH DAILY     Cardiovascular:  Calcium Channel Blockers Passed - 01/26/2020  7:03 AM      Passed - Last BP in normal range    BP Readings from Last 1 Encounters:  09/18/19 132/71         Passed - Valid encounter within last 6 months    Recent Outpatient Visits          3 months ago Chronic respiratory failure with hypoxia [facility])   [facility], [facility], FNP   4 months ago Chronic respiratory failure with hypoxia [facility])   [facility], [facility], FNP   4 months ago Chronic bronchitis with COPD (chronic obstructive pulmonary disease) (HCC)   [facility], [facility], FNP   5 months ago Pulmonary emphysema, unspecified emphysema type [facility])   [facility], [facility], FNP   5 months ago Palliative care by specialist   [facility], [facility], FNP             . hydrOXYzine (ATARAX/VISTARIL) 10 MG tablet [Pharmacy Med Name: HYDROXYZINE HCL 10MG  TABLETS] 180 tablet 1    Sig: TAKE 1 TO 2 TABLETS(10 TO 20 MG) BY MOUTH THREE TIMES DAILY AS NEEDED FOR ANXIETY OR INSOMNIA     Ear, Nose, and Throat:  Antihistamines Passed - 01/26/2020  7:03 AM      Passed - Valid encounter within last 12 months    Recent Outpatient Visits          3 months ago Chronic respiratory failure with hypoxia [facility])   [facility], [facility], FNP   4 months ago Chronic respiratory failure with hypoxia [facility]   [facility], [facility], FNP   4 months ago Chronic bronchitis with COPD (chronic obstructive pulmonary disease) (HCC)   [facility], Jodelle Gross, FNP   5 months ago Pulmonary emphysema, unspecified emphysema type [facility])   [facility], [facility], FNP   5 months ago Palliative care by specialist   [facility], Jodelle Gross, FNP             . atorvastatin (LIPITOR) 40 MG tablet [Pharmacy Med Name: ATORVASTATIN 40MG  TABLETS] 30 tablet     Sig: TAKE 1 TABLET(40 MG) BY MOUTH DAILY     Cardiovascular:  Antilipid - Statins Failed - 01/26/2020  7:03 AM      Failed - Total Cholesterol in normal range and within 360 days    Cholesterol, Total  Date Value Ref Range Status  02/13/2018 328 (H) 100 - 199 mg/dL Final   Cholesterol  Date Value Ref Range Status  11/09/2018 235 (H) <200 mg/dL Final         Failed - LDL in normal range and within 360 days    LDL Cholesterol (Calc)  Date Value Ref Range Status  11/09/2018 131 (H) mg/dL (calc) Final    Comment:    Reference range: <100 . Desirable range <100 mg/dL for primary prevention;   <70 mg/dL for patients with CHD or diabetic patients  with > or = 2 CHD risk factors. 02/15/2018 LDL-C is now calculated using the Martin-Hopkins  calculation, which  is a validated novel method providing  better accuracy than the Friedewald equation in the  estimation of LDL-C.  Horald Pollen et al. Lenox Ahr. 6834;196(22): 2061-2068  (http://education.[employer].com/faq/FAQ164)          Failed - HDL in normal range and within 360 days    HDL  Date Value Ref Range Status  11/09/2018 82 > OR = 50 mg/dL Final  29/79/8921 74 >19 mg/dL Final         Failed - Triglycerides in normal range and within 360 days    Triglycerides  Date Value Ref Range Status  11/09/2018 112 <150 mg/dL Final         Passed - Patient is not pregnant      Passed - Valid encounter within last 12 months    Recent Outpatient Visits          3 months ago Chronic respiratory failure with hypoxia [facility])   [facility], Jodelle Gross, FNP   4 months ago Chronic respiratory failure with hypoxia [facility])   [facility], Jodelle Gross, FNP   4 months ago Chronic bronchitis with COPD  (chronic obstructive pulmonary disease) (HCC)   [facility], Jodelle Gross, FNP   5 months ago Pulmonary emphysema, unspecified emphysema type [facility])   [facility], Jodelle Gross, FNP   5 months ago Palliative care by specialist   [facility], Jodelle Gross, FNP

## 2020-02-19 ENCOUNTER — Other Ambulatory Visit: Payer: Self-pay | Admitting: Family Medicine

## 2020-02-19 DIAGNOSIS — Z515 Encounter for palliative care: Secondary | ICD-10-CM

## 2020-02-19 MED ORDER — MORPHINE SULFATE (CONCENTRATE) 10 MG/0.5ML PO SOLN: 5.0000 mg | ORAL | 0 refills | Status: AC | PRN

## 2020-02-19 NOTE — Telephone Encounter (Signed)
Medication Refill - Medication: MS Contin   Has the patient contacted their pharmacy? Yes.   [facility] called stating that pt took her last one today. She is requesting to have a call back once this has been sent in. Please advise.  (Agent: If no, request that the patient contact the pharmacy for the refill.) (Agent: If yes, when and what did the pharmacy advise?)  Preferred Pharmacy (with phone number or street name):  [id number] [place], [place] - [address] AT [facility] OF [address] [facility]  [address] [place] [address]  Phone: [phone] Fax: [phone]  Hours: Not open 24 hours     Agent: Please be advised that RX refills may take up to 3 business days. We ask that you follow-up with your pharmacy.

## 2020-02-19 NOTE — Telephone Encounter (Signed)
Requested medication (s) are due for refill today: Yes  Requested medication (s) are on the active medication list: Yes  Last refill:  01/14/20  Future visit scheduled: No  Notes to clinic:  Unable to refill per protocol, cannot delegate.    Requested Prescriptions  Pending Prescriptions Disp Refills   Morphine Sulfate (MORPHINE CONCENTRATE) 10 MG/0.5ML SOLN concentrated solution 30 mL 0    Sig: Take 0.25 mLs (5 mg total) by mouth every 2 (two) hours as needed for severe pain or shortness of breath.      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

## 2020-02-19 NOTE — Telephone Encounter (Signed)
[place]ally [place], 02/19/20.  According to PMP aware, pt last received a refill on 02/03/2020.  Refill of her controlled substance will be provided today.

## 2020-03-24 DEATH — deceased

## 2021-09-12 IMAGING — DX DG CHEST 1V PORT
2 series · 2 of 2 positions shown · non-contrast
Comparison: June 22, 2019

CLINICAL DATA: Chest tube placement

EXAM:
PORTABLE CHEST 1 VIEW

[chest ap (1 of 2)]
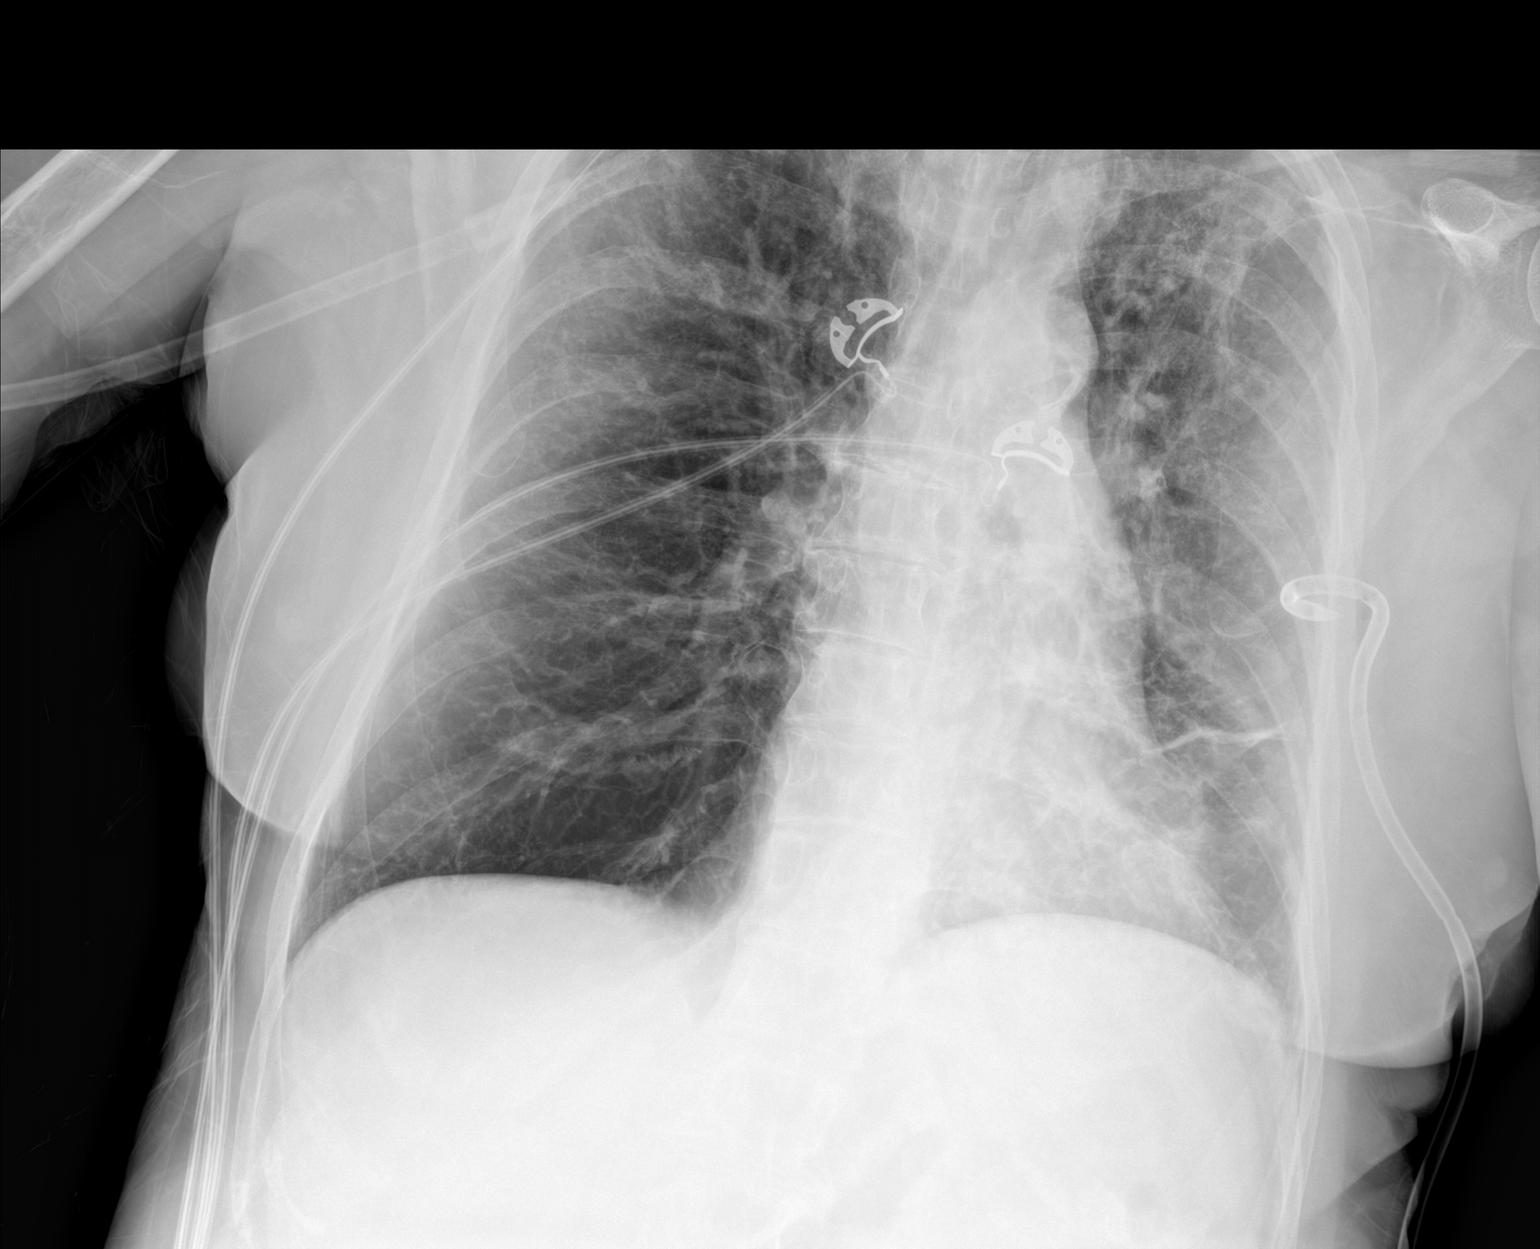

[chest ap (2 of 2)]
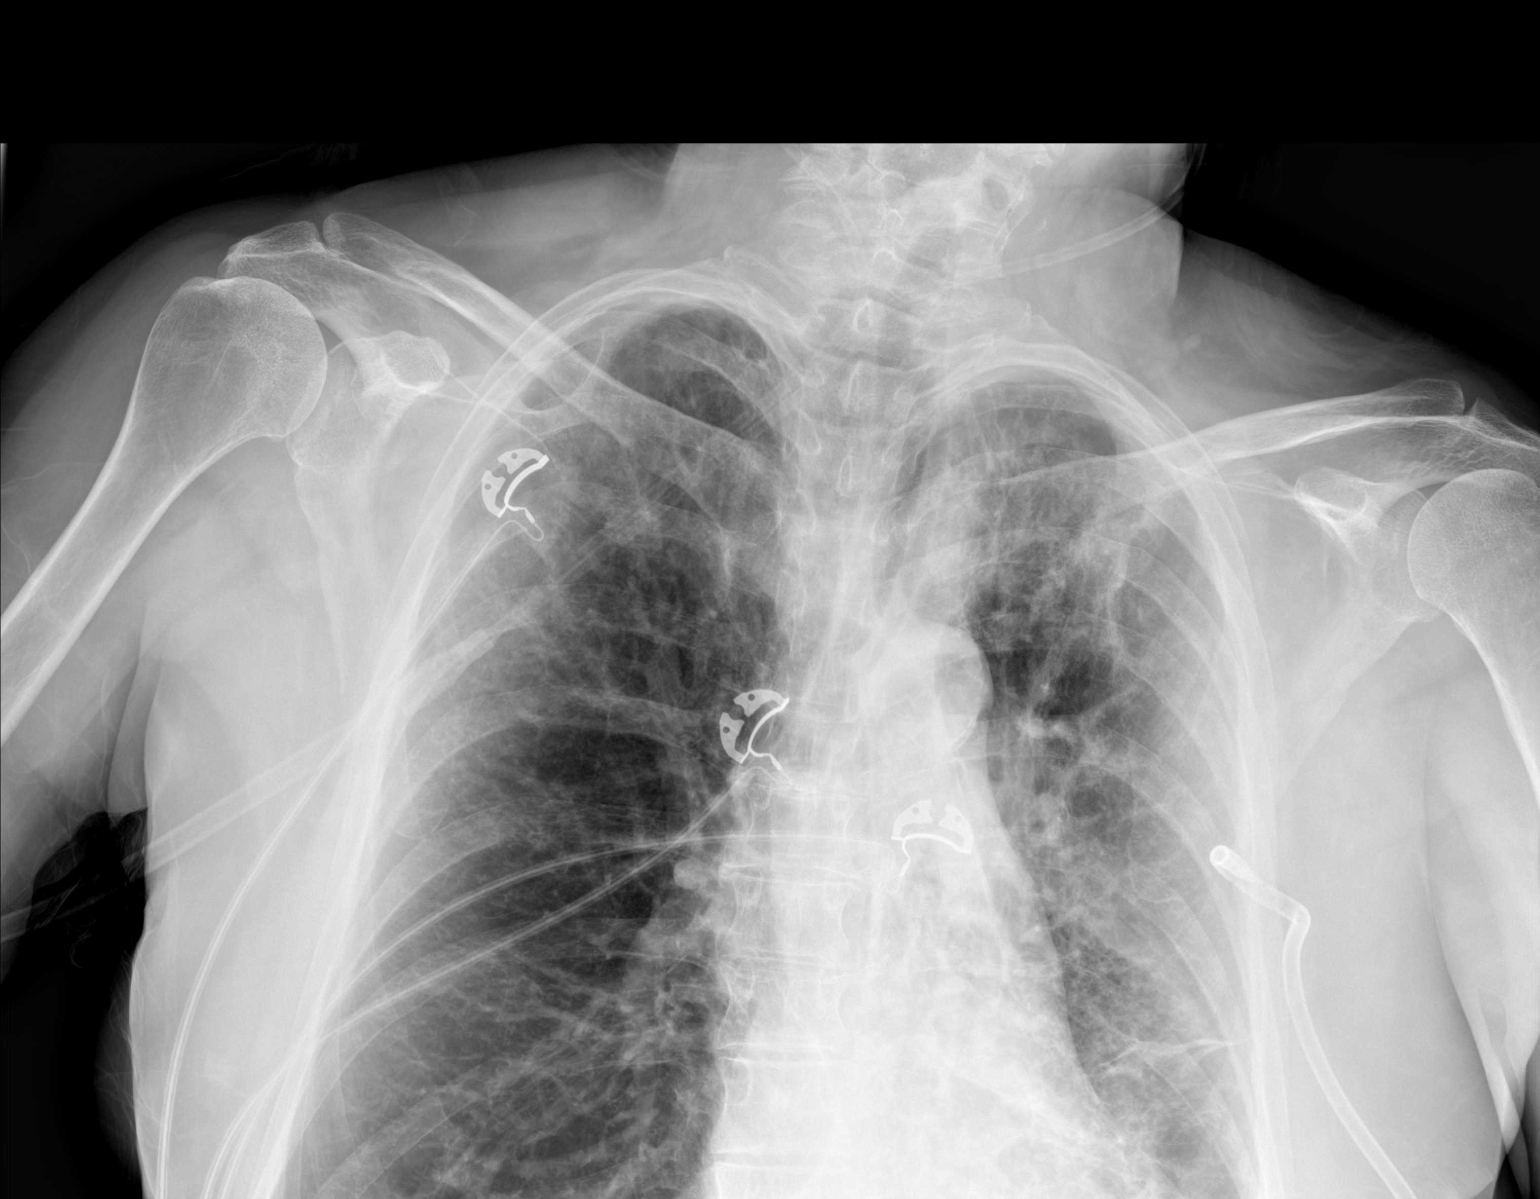

[2 of 2 positions shown; findings below may reference images not displayed]

FINDINGS: The heart size and mediastinal contours are within normal limits.
There is interval placement of a left-sided pigtail catheter with
re-expansion of the left lung. No definite residual pneumothorax is
seen. Bilateral coarsened interstitial markings are seen throughout
both lungs with hyperinflation of the upper lung zones. No pleural
effusion.
IMPRESSION: Interval placement of left-sided pigtail catheter with no residual
pneumothorax seen.

## 2021-09-13 IMAGING — DX DG CHEST 1V PORT
1 series · 1 of 1 positions shown · non-contrast
Comparison: Chest radiograph from one day prior.

CLINICAL DATA: Left pneumothorax with chest tube in place.  COPD.

EXAM:
PORTABLE CHEST 1 VIEW

[chest ap]
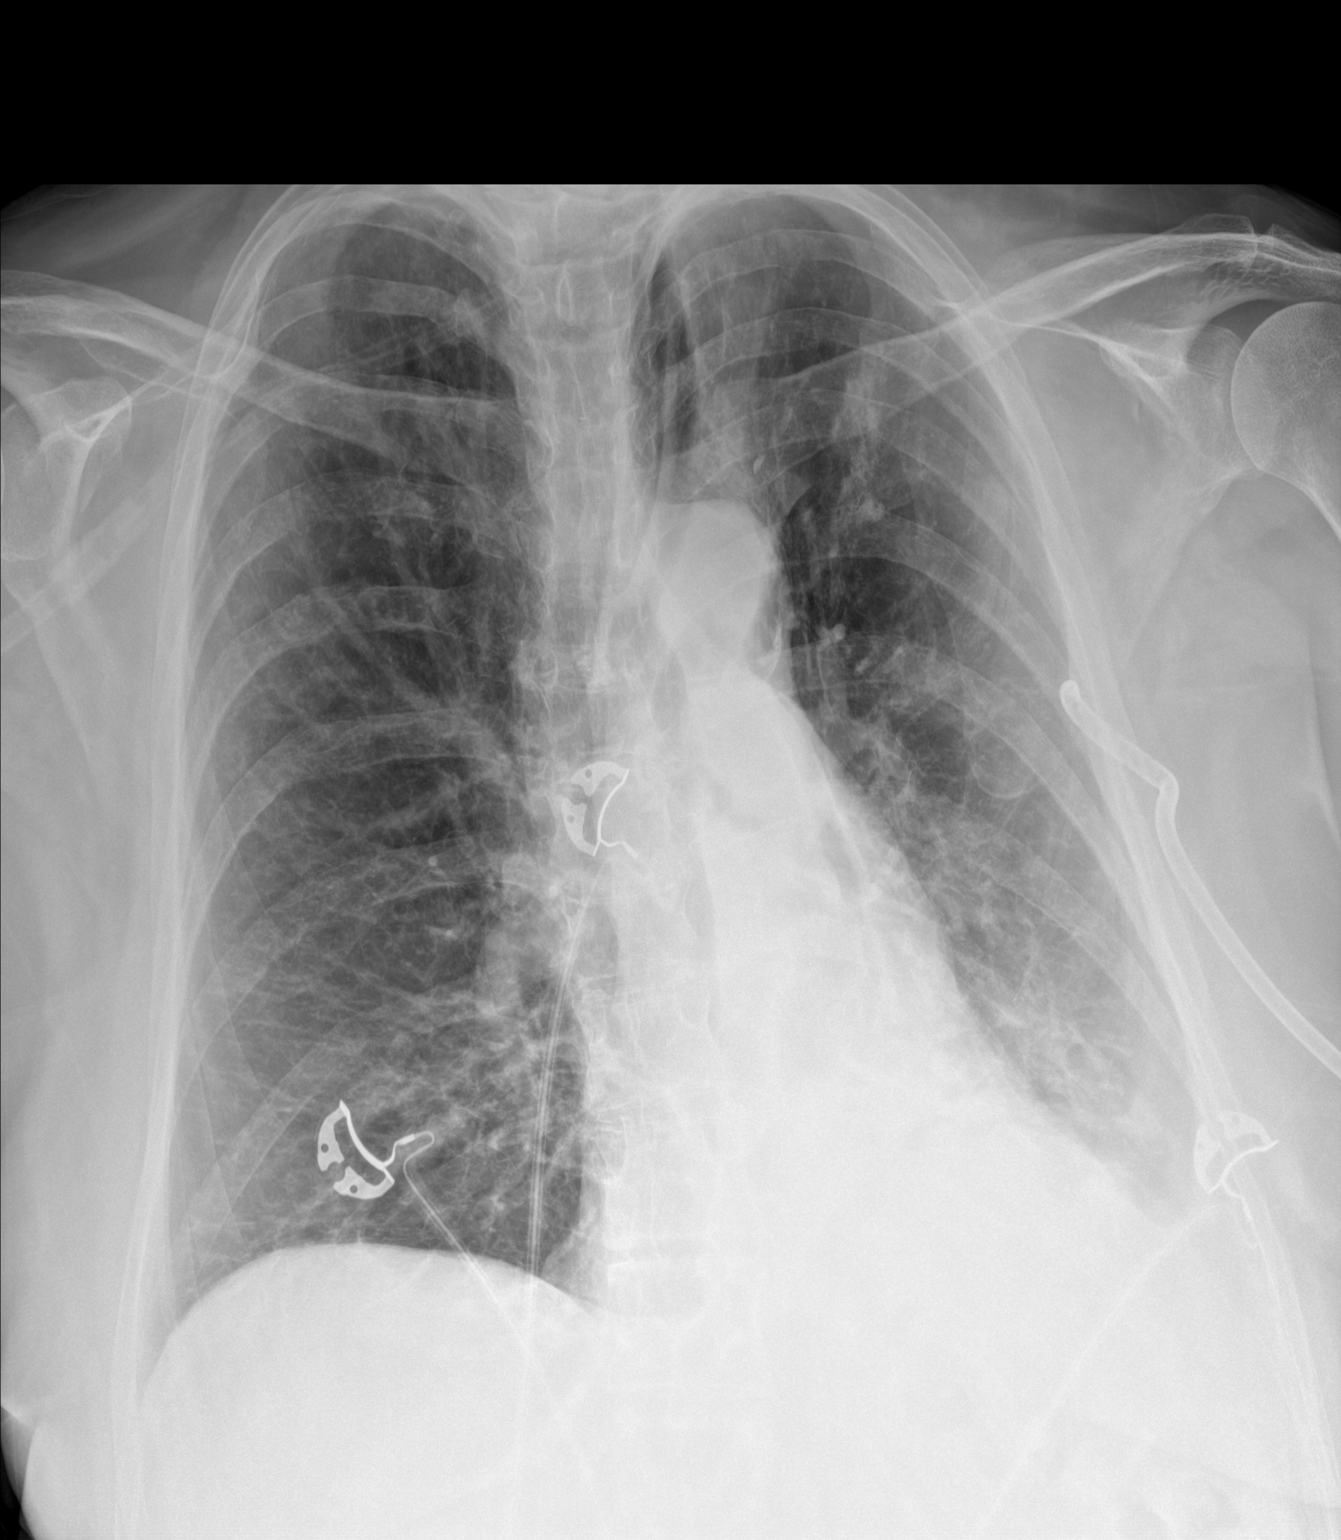

[1 of 1 positions shown; findings below may reference images not displayed]

FINDINGS: Stable peripheral left chest tube. Stable cardiomediastinal
silhouette with normal heart size. No pneumothorax. No significant
pleural effusions. Hyperinflated lungs. Emphysema. No pulmonary
edema. Stable patchy left lung base opacity.
IMPRESSION: 1. No pneumothorax. Stable peripheral left chest tube position.
2. Hyperinflated lungs and emphysema, compatible with COPD.
3. Stable patchy left lung base opacity, favor atelectasis.

## 2021-09-14 IMAGING — DX DG CHEST 1V PORT
1 series · 1 of 1 positions shown · non-contrast
Comparison: June 23, 2019

CLINICAL DATA: Recent pneumothorax with chest tube placement

EXAM:
PORTABLE CHEST 1 VIEW

[chest ap]
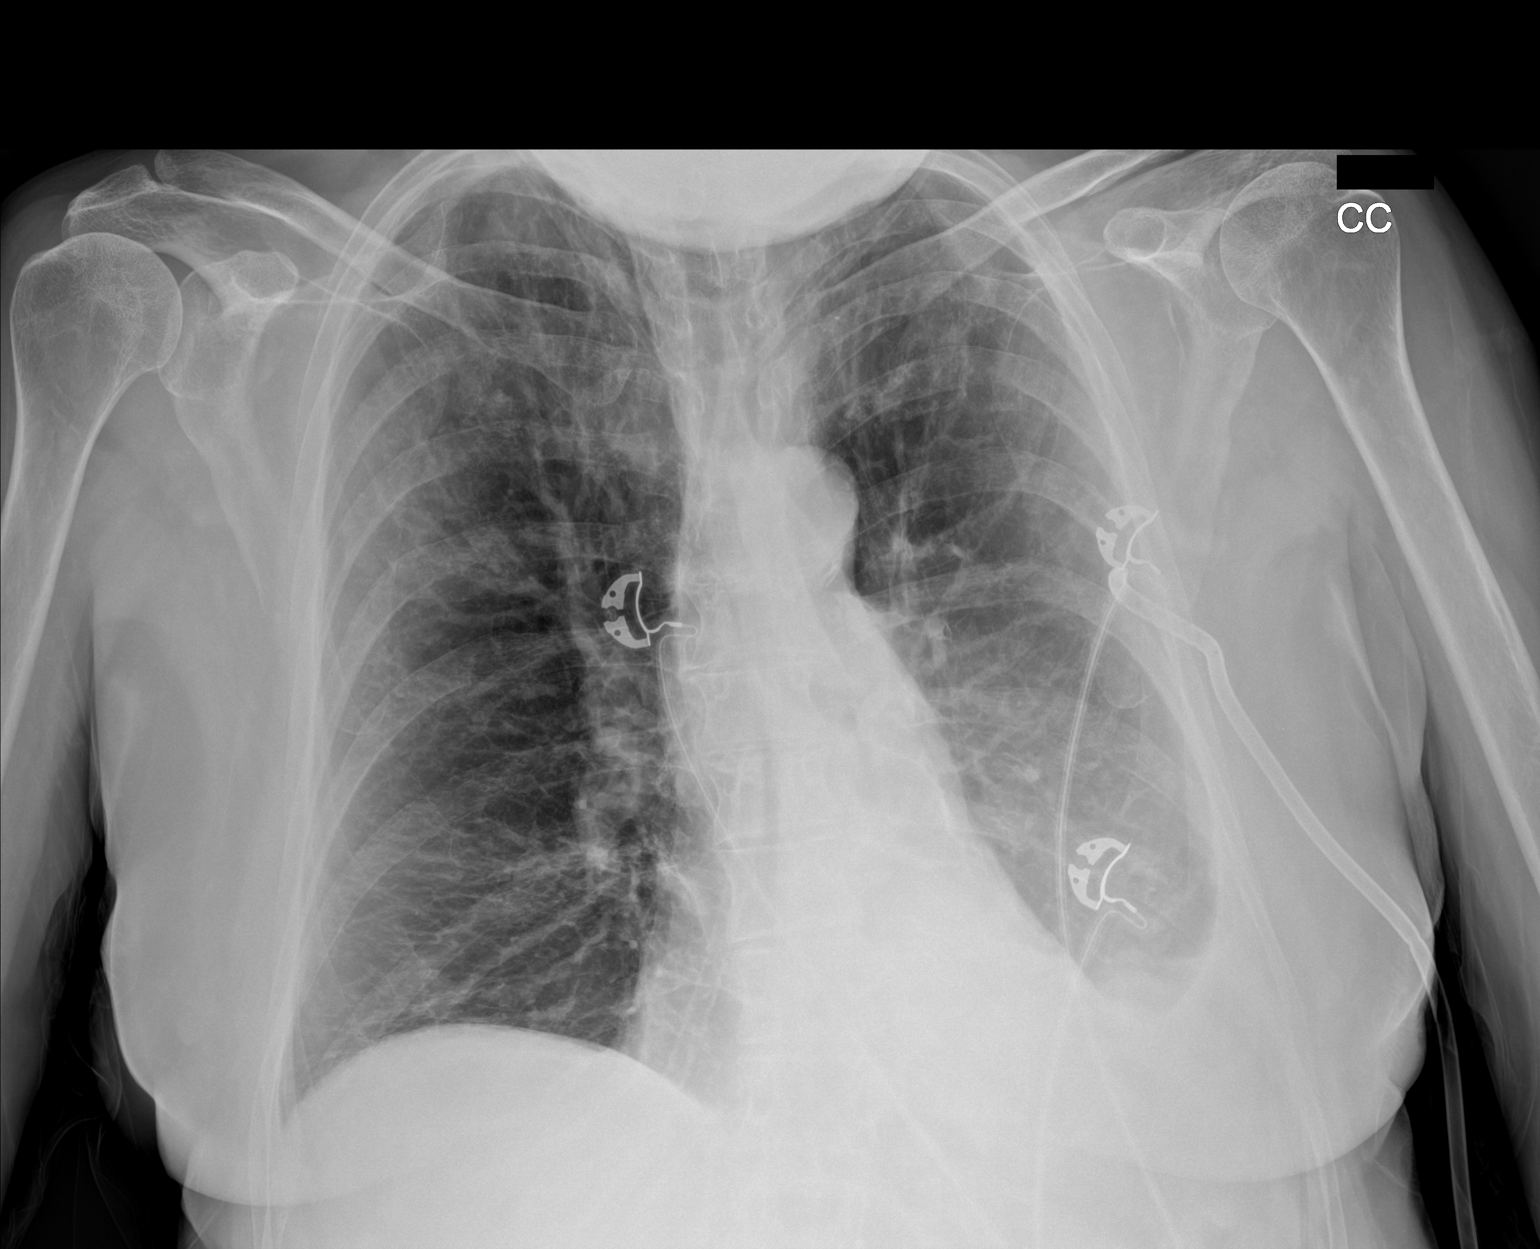

[1 of 1 positions shown; findings below may reference images not displayed]

FINDINGS: Chest tube present peripherally in the left lateral hemithorax,
stable. No pneumothorax evident. There is a small left pleural
effusion with left base atelectasis. There is mild atelectatic
change in the right base. Heart size and pulmonary vascularity are
within normal limits. No adenopathy. No bone lesions.
IMPRESSION: No appreciable pneumothorax. Small left pleural effusion with left
base atelectasis. There is also right base atelectasis. Stable
cardiac silhouette.

## 2021-09-14 IMAGING — DX DG CHEST 1V PORT
1 series · 1 of 1 positions shown · non-contrast
Comparison: June 24, 2019

CLINICAL DATA: Chest tube placed to water seal

EXAM:
PORTABLE CHEST 1 VIEW

[chest ap]
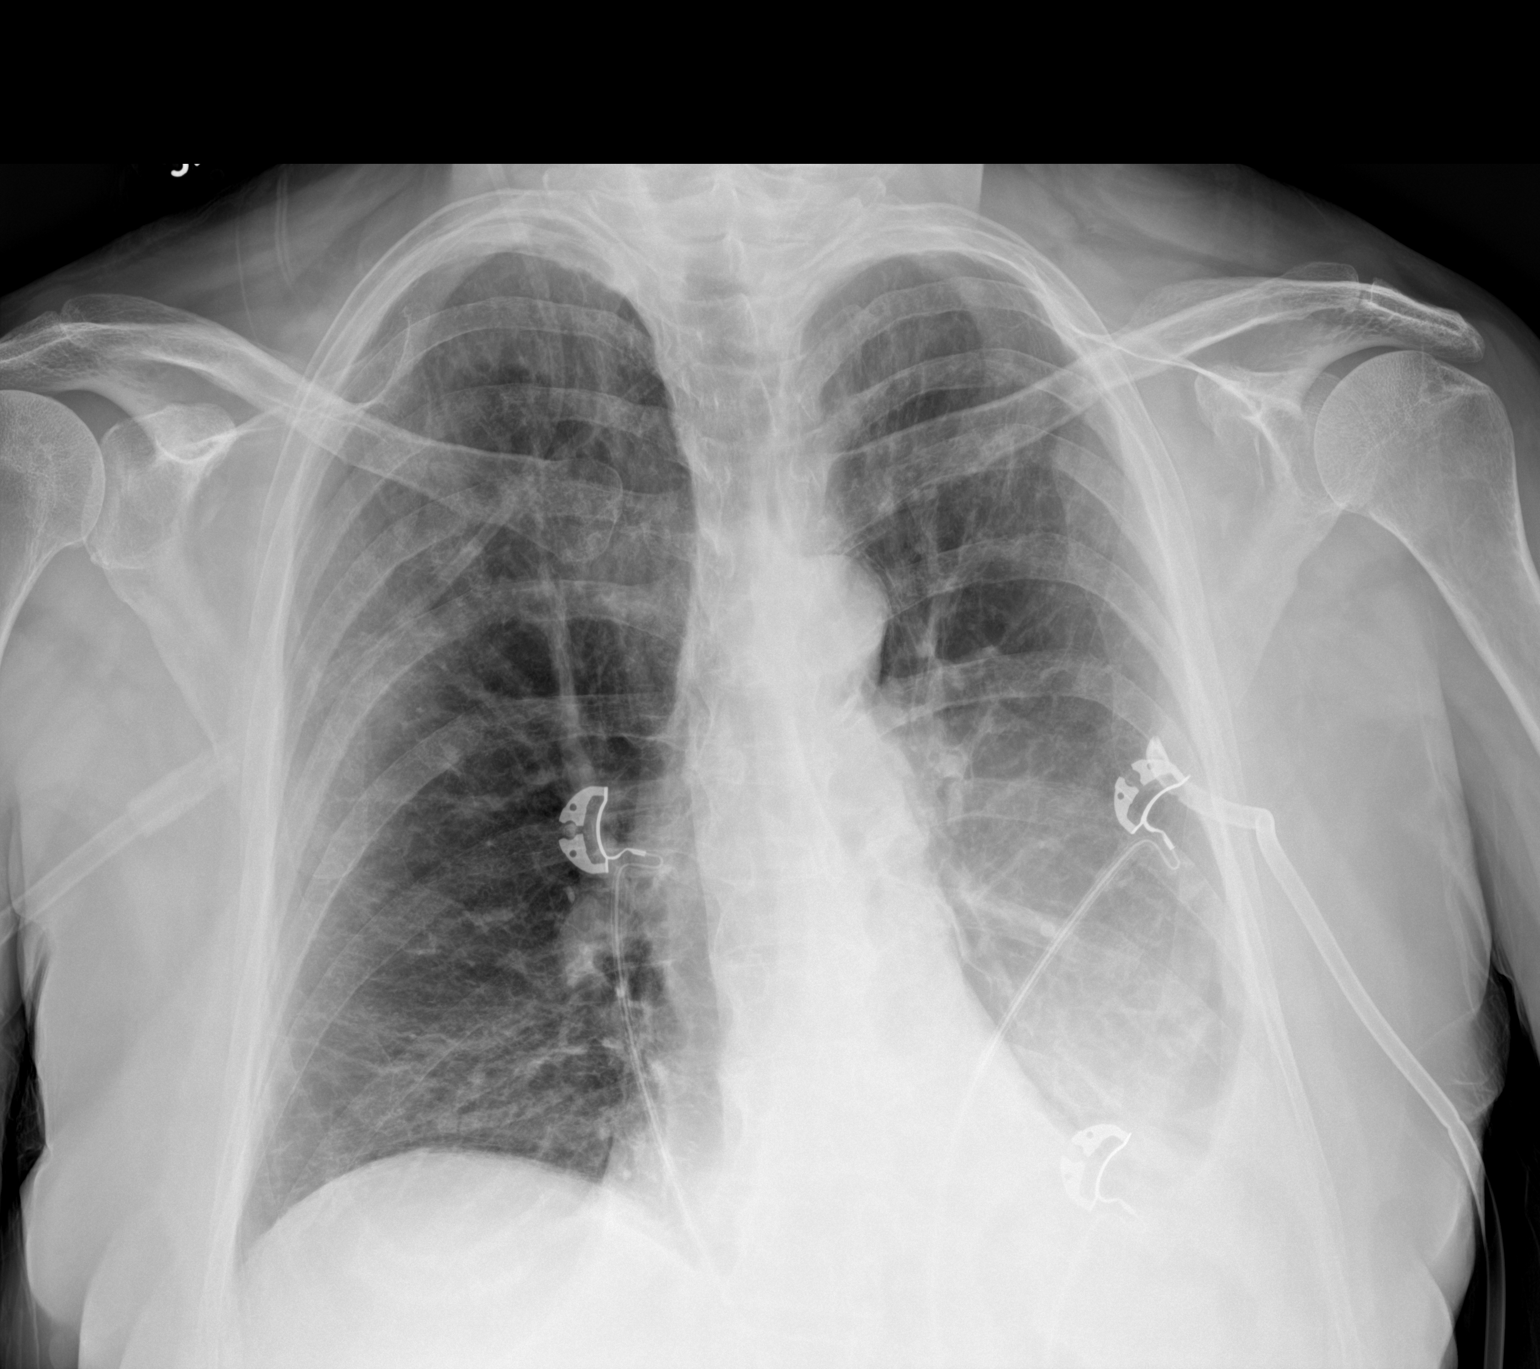

[1 of 1 positions shown; findings below may reference images not displayed]

FINDINGS: Chest tube position unchanged. No pneumothorax. There is a left
pleural effusion with left base atelectasis. There is mild
atelectatic change in the right base. No new opacity evident. Heart
size and pulmonary vascularity are within normal limits. No evident
adenopathy. No bone lesions.
IMPRESSION: No change in chest tube position. No pneumothorax. Stable left
pleural effusion. Bibasilar atelectasis. No new opacity. Stable
cardiac silhouette.

## 2021-09-15 IMAGING — DX DG CHEST 1V PORT
1 series · 1 of 1 positions shown · non-contrast
Comparison: None.

CLINICAL DATA: Pneumothorax

EXAM:
PORTABLE CHEST 1 VIEW

[chest ap]
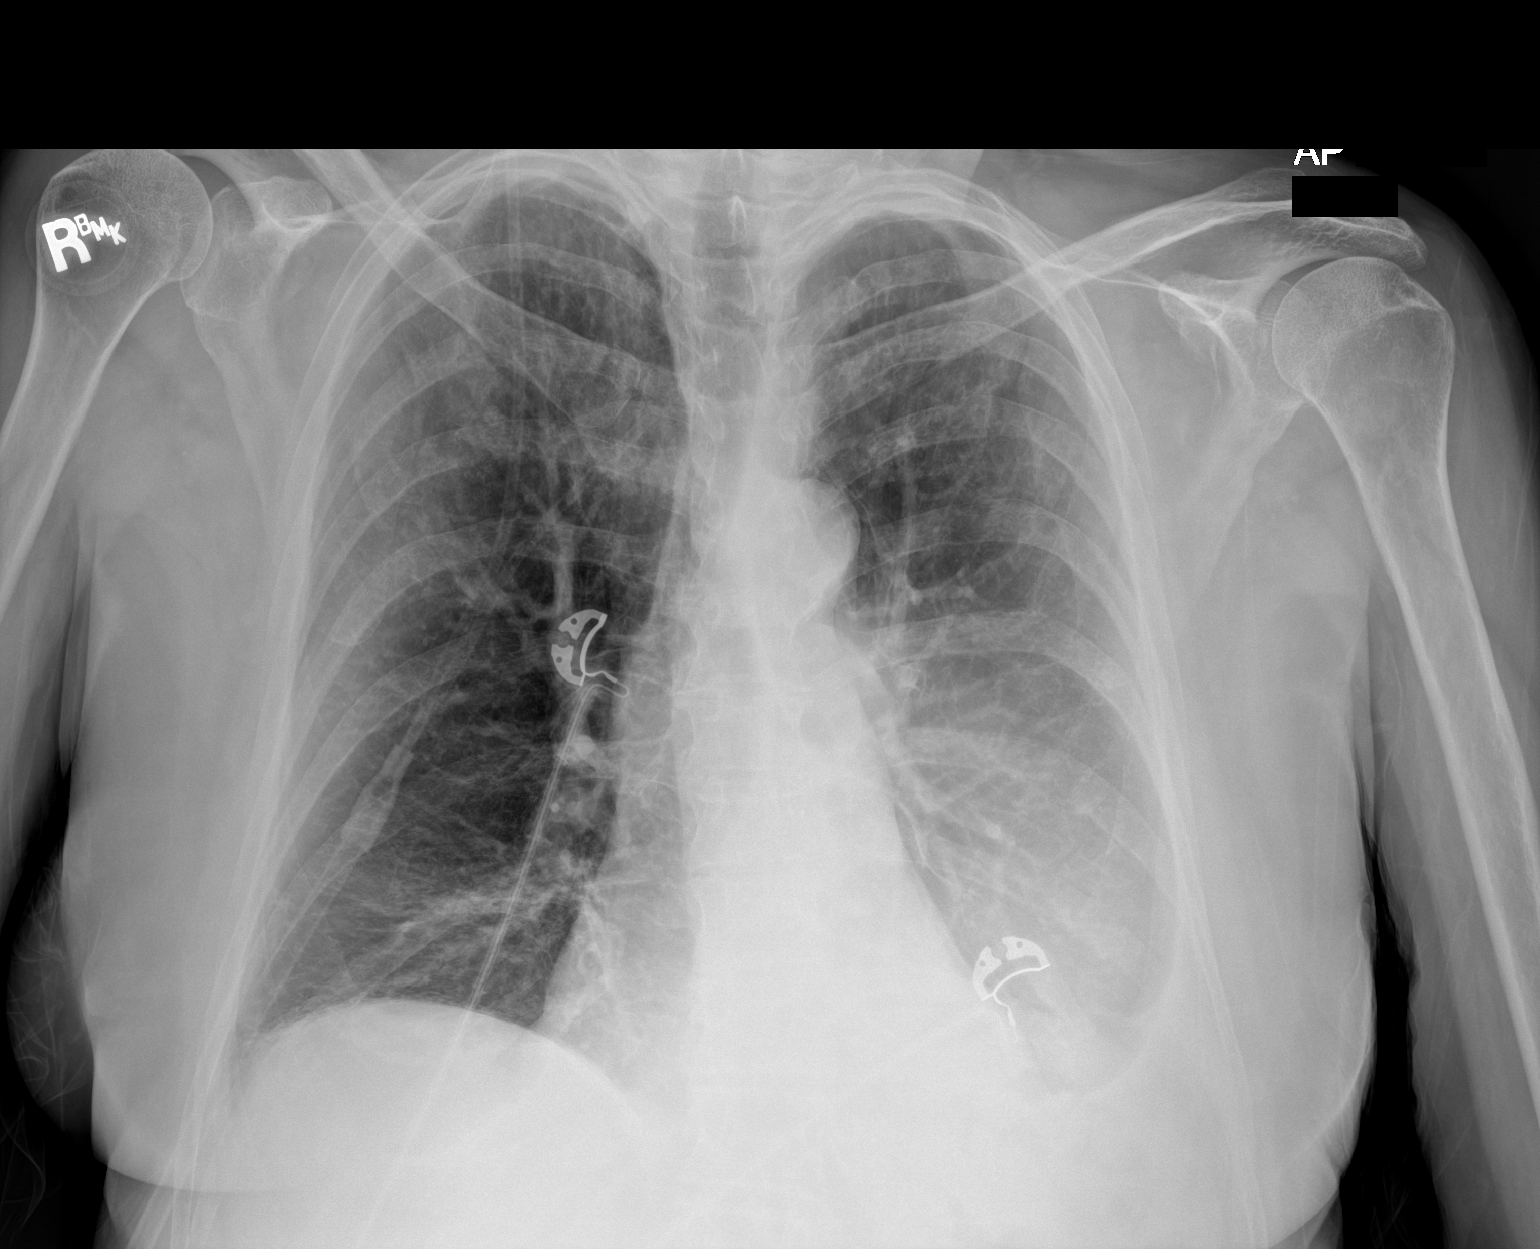

[1 of 1 positions shown; findings below may reference images not displayed]

FINDINGS: The heart size and mediastinal contours are within normal limits.
Aortic knob calcifications are seen. No definite residual
pneumothorax is noted. There is diffusely increased pulmonary
vasculature throughout both lungs. A small left pleural effusion is
present.
IMPRESSION: No definite pneumothorax.

Pulmonary vascular congestion and small left pleural effusion.
